# Patient Record
Sex: Female | Born: 1950
Health system: Southern US, Community
[De-identification: ages and names within clinical notes are randomized; demographics above are authoritative.]

## PROBLEM LIST (undated history)

## (undated) DIAGNOSIS — M1990 Unspecified osteoarthritis, unspecified site: Secondary | ICD-10-CM

## (undated) DIAGNOSIS — E785 Hyperlipidemia, unspecified: Secondary | ICD-10-CM

## (undated) DIAGNOSIS — I10 Essential (primary) hypertension: Secondary | ICD-10-CM

## (undated) DIAGNOSIS — I8390 Asymptomatic varicose veins of unspecified lower extremity: Secondary | ICD-10-CM

## (undated) DIAGNOSIS — L509 Urticaria, unspecified: Secondary | ICD-10-CM

## (undated) DIAGNOSIS — IMO0002 Reserved for concepts with insufficient information to code with codable children: Secondary | ICD-10-CM

## (undated) DIAGNOSIS — Z9289 Personal history of other medical treatment: Secondary | ICD-10-CM

## (undated) DIAGNOSIS — H9319 Tinnitus, unspecified ear: Secondary | ICD-10-CM

## (undated) DIAGNOSIS — R0789 Other chest pain: Secondary | ICD-10-CM

## (undated) DIAGNOSIS — K219 Gastro-esophageal reflux disease without esophagitis: Secondary | ICD-10-CM

## (undated) DIAGNOSIS — D259 Leiomyoma of uterus, unspecified: Secondary | ICD-10-CM

## (undated) DIAGNOSIS — K649 Unspecified hemorrhoids: Secondary | ICD-10-CM

## (undated) DIAGNOSIS — F419 Anxiety disorder, unspecified: Secondary | ICD-10-CM

## (undated) HISTORY — PX: KNEE SURGERY: SHX244

## (undated) HISTORY — DX: Gastro-esophageal reflux disease without esophagitis: K21.9

## (undated) HISTORY — PX: CARPAL TUNNEL RELEASE: SHX101

## (undated) HISTORY — PX: HEMORROIDECTOMY: SUR656

## (undated) HISTORY — DX: Essential (primary) hypertension: I10

## (undated) HISTORY — PX: TUBAL LIGATION: SHX77

## (undated) HISTORY — DX: Urticaria, unspecified: L50.9

## (undated) HISTORY — PX: JOINT REPLACEMENT: SHX530

## (undated) HISTORY — PX: ROTATOR CUFF REPAIR: SHX139

---

## 1973-07-04 HISTORY — PX: HERNIA REPAIR: SHX51

## 1976-07-04 DIAGNOSIS — Z9289 Personal history of other medical treatment: Secondary | ICD-10-CM

## 1976-07-04 HISTORY — DX: Personal history of other medical treatment: Z92.89

## 2000-12-06 ENCOUNTER — Encounter (HOSPITAL_COMMUNITY): Admission: RE | Admit: 2000-12-06 | Discharge: 2001-01-05 | Payer: Self-pay | Admitting: Orthopedic Surgery

## 2001-01-08 ENCOUNTER — Encounter (HOSPITAL_COMMUNITY): Admission: RE | Admit: 2001-01-08 | Discharge: 2001-01-31 | Payer: Self-pay | Admitting: Orthopedic Surgery

## 2001-01-23 ENCOUNTER — Other Ambulatory Visit: Admission: RE | Admit: 2001-01-23 | Discharge: 2001-01-23 | Payer: Self-pay | Admitting: Obstetrics and Gynecology

## 2001-02-01 ENCOUNTER — Ambulatory Visit (HOSPITAL_COMMUNITY): Admission: RE | Admit: 2001-02-01 | Discharge: 2001-02-01 | Payer: Self-pay | Admitting: Obstetrics and Gynecology

## 2001-02-01 ENCOUNTER — Encounter: Payer: Self-pay | Admitting: Obstetrics and Gynecology

## 2003-04-07 ENCOUNTER — Encounter: Payer: Self-pay | Admitting: Orthopedic Surgery

## 2004-01-26 ENCOUNTER — Other Ambulatory Visit: Admission: RE | Admit: 2004-01-26 | Discharge: 2004-01-26 | Payer: Self-pay | Admitting: Dermatology

## 2005-09-21 ENCOUNTER — Ambulatory Visit: Payer: Self-pay | Admitting: Orthopedic Surgery

## 2006-11-15 ENCOUNTER — Ambulatory Visit (HOSPITAL_COMMUNITY): Admission: RE | Admit: 2006-11-15 | Discharge: 2006-11-15 | Payer: Self-pay | Admitting: Family Medicine

## 2008-12-25 ENCOUNTER — Ambulatory Visit: Payer: Self-pay | Admitting: Orthopedic Surgery

## 2008-12-25 ENCOUNTER — Encounter (INDEPENDENT_AMBULATORY_CARE_PROVIDER_SITE_OTHER): Payer: Self-pay | Admitting: *Deleted

## 2008-12-25 DIAGNOSIS — Z8679 Personal history of other diseases of the circulatory system: Secondary | ICD-10-CM | POA: Insufficient documentation

## 2008-12-25 DIAGNOSIS — IMO0002 Reserved for concepts with insufficient information to code with codable children: Secondary | ICD-10-CM | POA: Insufficient documentation

## 2008-12-25 DIAGNOSIS — M25569 Pain in unspecified knee: Secondary | ICD-10-CM | POA: Insufficient documentation

## 2009-02-24 ENCOUNTER — Encounter: Admission: RE | Admit: 2009-02-24 | Discharge: 2009-02-24 | Payer: Self-pay | Admitting: Specialist

## 2009-04-24 ENCOUNTER — Ambulatory Visit (HOSPITAL_BASED_OUTPATIENT_CLINIC_OR_DEPARTMENT_OTHER): Admission: RE | Admit: 2009-04-24 | Discharge: 2009-04-24 | Payer: Self-pay | Admitting: Specialist

## 2010-02-18 ENCOUNTER — Ambulatory Visit (HOSPITAL_COMMUNITY): Admission: RE | Admit: 2010-02-18 | Discharge: 2010-02-18 | Payer: Self-pay | Admitting: Specialist

## 2010-02-18 ENCOUNTER — Ambulatory Visit: Payer: Self-pay | Admitting: Vascular Surgery

## 2010-03-05 ENCOUNTER — Ambulatory Visit (HOSPITAL_BASED_OUTPATIENT_CLINIC_OR_DEPARTMENT_OTHER): Admission: RE | Admit: 2010-03-05 | Discharge: 2010-03-05 | Payer: Self-pay | Admitting: Specialist

## 2010-09-16 LAB — POCT I-STAT 4, (NA,K, GLUC, HGB,HCT)
Glucose, Bld: 87 mg/dL (ref 70–99)
HCT: 36 % (ref 36.0–46.0)
Hemoglobin: 12.2 g/dL (ref 12.0–15.0)
Potassium: 3.4 mEq/L — ABNORMAL LOW (ref 3.5–5.1)
Sodium: 139 mEq/L (ref 135–145)

## 2010-10-07 LAB — POCT I-STAT 4, (NA,K, GLUC, HGB,HCT)
Glucose, Bld: 85 mg/dL (ref 70–99)
HCT: 39 % (ref 36.0–46.0)
Hemoglobin: 13.3 g/dL (ref 12.0–15.0)
Potassium: 3.4 mEq/L — ABNORMAL LOW (ref 3.5–5.1)
Sodium: 135 mEq/L (ref 135–145)

## 2011-12-01 ENCOUNTER — Encounter: Payer: Self-pay | Admitting: Orthopedic Surgery

## 2011-12-01 ENCOUNTER — Ambulatory Visit (INDEPENDENT_AMBULATORY_CARE_PROVIDER_SITE_OTHER): Payer: PRIVATE HEALTH INSURANCE

## 2011-12-01 ENCOUNTER — Ambulatory Visit (INDEPENDENT_AMBULATORY_CARE_PROVIDER_SITE_OTHER): Payer: PRIVATE HEALTH INSURANCE | Admitting: Orthopedic Surgery

## 2011-12-01 VITALS — BP 130/70 | Ht 65.0 in | Wt 215.0 lb

## 2011-12-01 DIAGNOSIS — M25569 Pain in unspecified knee: Secondary | ICD-10-CM

## 2011-12-01 DIAGNOSIS — M76899 Other specified enthesopathies of unspecified lower limb, excluding foot: Secondary | ICD-10-CM

## 2011-12-01 DIAGNOSIS — M7050 Other bursitis of knee, unspecified knee: Secondary | ICD-10-CM

## 2011-12-01 NOTE — Patient Instructions (Signed)
You have received a steroid shot. 15% of patients experience increased pain at the injection site with in the next 24 hours. This is best treated with ice and tylenol extra strength 2 tabs every 8 hours. If you are still having pain please call the office.   Apply several times a day   Max freeze over the counter at pharmacy 3 times a day

## 2011-12-02 DIAGNOSIS — M7050 Other bursitis of knee, unspecified knee: Secondary | ICD-10-CM | POA: Insufficient documentation

## 2011-12-02 NOTE — Progress Notes (Signed)
  Subjective:    Susan Oneill is a 61 y.o. female who presents with knee pain involving the right knee. Onset was sudden, not related to any specific activity. Inciting event: none known. Current symptoms include: pain located over the pes tendons  and swelling. Pain is aggravated by any weight bearing, going up and down stairs, rising after sitting, standing and walking. Patient has had prior knee problems. Evaluation to date: none. Treatment to date: none.  The following portions of the patient's history were reviewed and updated as appropriate: allergies, current medications, past family history, past medical history, past social history, past surgical history and problem list.   Review of Systems Pertinent items are noted in HPI.   Objective:  Vital signs are stable as recorded  General appearance is normal  The patient is alert and oriented x3  The patient's mood and affect are normal  Gait assessment: normal  The cardiovascular exam reveals normal pulses and temperature without edema swelling.  The lymphatic system is negative for palpable lymph nodes  The sensory exam is normal.  There are no pathologic reflexes.  Balance is normal.   Exam of the right knee  Inspection swelling medial side of the knee  Range of motion normal  Stability normal  Strength normal  Skin normal    Assessment:    Right Severe pes anserinus tendonitis on the right    Plan:    Natural history and expected course discussed. Questions answered. Rest, ice, compression, and elevation (RICE) therapy. OTC analgesics as needed.  Injection  Inject RIGHT knee bursa Knee  Injection Procedure Note  Pre-operative Diagnosis: right knee Bursitis Post-operative Diagnosis: same  Indications: pain  Anesthesia: ethyl chloride   Procedure Details   Verbal consent was obtained for the procedure. Time out was completed.The Medial pes  bursa was prepped with alcohol, followed by  Ethyl  chloride spray and A 25 gauge needle was inserted into the area of maximal tenderness.  1ml 1% lidocaine and 1 ml of depomedrol  was then injected into the bursa . The needle was removed and the area cleansed and dressed.  Complications:  None; patient tolerated the procedure well

## 2012-02-19 ENCOUNTER — Emergency Department (HOSPITAL_COMMUNITY)
Admission: EM | Admit: 2012-02-19 | Discharge: 2012-02-19 | Disposition: A | Discharge status: Home / Self Care | Payer: PRIVATE HEALTH INSURANCE | Attending: Emergency Medicine | Admitting: Emergency Medicine

## 2012-02-19 ENCOUNTER — Encounter (HOSPITAL_COMMUNITY): Payer: Self-pay | Admitting: *Deleted

## 2012-02-19 DIAGNOSIS — I739 Peripheral vascular disease, unspecified: Secondary | ICD-10-CM | POA: Insufficient documentation

## 2012-02-19 DIAGNOSIS — Z880 Allergy status to penicillin: Secondary | ICD-10-CM | POA: Insufficient documentation

## 2012-02-19 DIAGNOSIS — S81009A Unspecified open wound, unspecified knee, initial encounter: Secondary | ICD-10-CM | POA: Insufficient documentation

## 2012-02-19 DIAGNOSIS — S81819A Laceration without foreign body, unspecified lower leg, initial encounter: Secondary | ICD-10-CM

## 2012-02-19 DIAGNOSIS — I10 Essential (primary) hypertension: Secondary | ICD-10-CM | POA: Insufficient documentation

## 2012-02-19 DIAGNOSIS — Z7982 Long term (current) use of aspirin: Secondary | ICD-10-CM | POA: Insufficient documentation

## 2012-02-19 DIAGNOSIS — Z882 Allergy status to sulfonamides status: Secondary | ICD-10-CM | POA: Insufficient documentation

## 2012-02-19 DIAGNOSIS — K219 Gastro-esophageal reflux disease without esophagitis: Secondary | ICD-10-CM | POA: Insufficient documentation

## 2012-02-19 DIAGNOSIS — W278XXA Contact with other nonpowered hand tool, initial encounter: Secondary | ICD-10-CM | POA: Insufficient documentation

## 2012-02-19 MED ORDER — "THROMBI-PAD 3""X3"" EX PADS"
MEDICATED_PAD | CUTANEOUS | Status: AC
  Administered 2012-02-19: 18:00:00
  Filled 2012-02-19: qty 1

## 2012-02-19 MED ORDER — LIDOCAINE HCL (PF) 1 % IJ SOLN
INTRAMUSCULAR | Status: AC
  Filled 2012-02-19: qty 5

## 2012-02-19 NOTE — ED Notes (Signed)
Applied Thrombin pad to L leg site as it is still oozing.

## 2012-02-19 NOTE — ED Notes (Signed)
Patient with no complaints at this time. Respirations even and unlabored. Skin warm/dry. Discharge instructions reviewed with patient at this time. Patient given opportunity to voice concerns/ask questions. Patient discharged at this time and left Emergency Department with steady gait.   

## 2012-02-19 NOTE — ED Provider Notes (Signed)
History    This chart was scribed for Susan Melter, MD, MD by Susan Oneill. The patient was seen in room APA14 and the patient's care was started at 3:20PM.   CSN: [id number]  Arrival date & time 02/19/12  1445   First MD Initiated Contact with Patient 02/19/12 1513      Chief Complaint  Patient presents with  . Leg Injury    (Consider location/radiation/quality/duration/timing/severity/associated sxs/prior treatment) The history is provided by the patient.   Susan Oneill is a 61 y.o. female who presents to the Emergency Department complaining of lower left leg laceration due to shaving today. She reports that she cut a varicose vein. She takes asa. Pt reports that she has hx of HTN, acid reflux and knee surgery.    Past Medical History  Diagnosis Date  . HTN (hypertension)   . Acid reflux   . PVD (peripheral vascular disease)     Past Surgical History  Procedure Date  . Knee surgery     bilateral  . Carpal tunnel release     bilateral  . Hernia repair   . Hemorroidectomy     Family History  Problem Relation Age of Onset  . Arthritis    . Heart disease      History  Substance Use Topics  . Smoking status: Never Smoker   . Smokeless tobacco: Not on file  . Alcohol Use: No    OB History    Grav Para Term Preterm Abortions TAB SAB Ect Mult Living   2 2 2              Review of Systems  All other systems reviewed and are negative.   10 Systems reviewed and all are negative for acute change except as noted in the HPI.   Allergies  Amoxicillin; Codeine; and Sulfonamide derivatives  Home Medications   Current Outpatient Rx  Name Route Sig Dispense Refill  . ASPIRIN EC 81 MG PO TBEC Oral Take 81 mg by mouth daily.    Marland Kitchen HYDROCHLOROTHIAZIDE 25 MG PO TABS Oral Take 25 mg by mouth daily.    . IBUPROFEN 200 MG PO TABS Oral Take 800 mg by mouth every 6 (six) hours as needed. pain    . LISINOPRIL 20 MG PO TABS Oral Take 20 mg by mouth daily.    Marland Kitchen  RANITIDINE HCL 300 MG PO TABS Oral Take 300 mg by mouth daily.      BP 146/66  Pulse 85  Temp 98.2 F (36.8 C) (Oral)  Resp 18  Ht 5\' 5"  (1.651 m)  Wt 214 lb (97.07 kg)  BMI 35.61 kg/m2  SpO2 100%  Physical Exam  Nursing note and vitals reviewed. Constitutional: She is oriented to person, place, and time. She appears well-developed and well-nourished. No distress.  HENT:  Head: Normocephalic and atraumatic.  Eyes: Conjunctivae are normal.  Neck: Normal range of motion. Neck supple.  Pulmonary/Chest: Effort normal. No respiratory distress.  Musculoskeletal: Normal range of motion.       Tiny puncture mid left calf that is bleeding and is not pulsatile   Neurological: She is alert and oriented to person, place, and time.  Skin: Skin is warm and dry.  Psychiatric: She has a normal mood and affect. Her behavior is normal.    ED Course  Procedures (including critical care time) DIAGNOSTIC STUDIES: Oxygen Saturation is 100% on room air, normal by my interpretation.    COORDINATION OF CARE: 3:23PM Discussed pt  ED treatment with pt  LACERATION REPAIR PROCEDURE NOTE The patient's identification was confirmed and consent was obtained. This procedure was performed by Susan Melter, MD at 3:36 PM. Site: mid left calf  Sterile procedures observed betadine  Anesthetic used (type and amt): 1% lidocaine without epi Suture type/size:4-O nylon  Length: punctate  # of Sutures: 1 Technique:single purse stitch  Complexity: simple Antibx ointment applied yes Tetanus UTD Site anesthetized, irrigated with NS, explored without evidence of foreign body, wound well approximated, site covered with dry, sterile dressing.  Patient tolerated procedure well without complications. Instructions for care discussed verbally and patient provided with additional written instructions for homecare and f/u. Pressure bandage applied and pt monitored Bleeding controlled after Thrombin  application      1. Laceration of leg       MDM  It is, bleeding, secondary to razor laceration of skin, and underlying varicosity. Patient is improved, with treatment in the ED.   I personally performed the services described in this documentation, which was scribed in my presence. The recorded information has been reviewed and considered.    Plan: Home Medications- usual; Home Treatments- wound care; Recommended follow up- Suture removal in 5 days      Susan Melter, MD 02/19/12 2149

## 2012-02-19 NOTE — ED Notes (Signed)
Lacerated varicose vein on L lower lateral leg w/razor.

## 2012-02-19 NOTE — ED Notes (Signed)
No oozing noted under Thrombi pad.  Wrapped leg w/ace wrap per Dr. Effie Shy.

## 2012-02-20 ENCOUNTER — Emergency Department (HOSPITAL_COMMUNITY)
Admission: EM | Admit: 2012-02-20 | Discharge: 2012-02-20 | Disposition: A | Discharge status: Home / Self Care | Payer: PRIVATE HEALTH INSURANCE | Attending: Emergency Medicine | Admitting: Emergency Medicine

## 2012-02-20 ENCOUNTER — Encounter (HOSPITAL_COMMUNITY): Payer: Self-pay | Admitting: *Deleted

## 2012-02-20 DIAGNOSIS — K219 Gastro-esophageal reflux disease without esophagitis: Secondary | ICD-10-CM | POA: Insufficient documentation

## 2012-02-20 DIAGNOSIS — I10 Essential (primary) hypertension: Secondary | ICD-10-CM | POA: Insufficient documentation

## 2012-02-20 DIAGNOSIS — I739 Peripheral vascular disease, unspecified: Secondary | ICD-10-CM | POA: Insufficient documentation

## 2012-02-20 DIAGNOSIS — I83899 Varicose veins of unspecified lower extremities with other complications: Secondary | ICD-10-CM

## 2012-02-20 DIAGNOSIS — Z4801 Encounter for change or removal of surgical wound dressing: Secondary | ICD-10-CM | POA: Insufficient documentation

## 2012-02-20 NOTE — ED Notes (Signed)
Pt with bleeding earlier to suture, no bleeding at this time

## 2012-02-20 NOTE — ED Provider Notes (Signed)
History     CSN: [id number]  Arrival date & time 02/20/12  2035   First MD Initiated Contact with Patient 02/20/12 2104      Chief Complaint  Patient presents with  . Leg Injury    (Consider location/radiation/quality/duration/timing/severity/associated sxs/prior treatment) HPI Comments: Patient returns to ED c/o episode of bleeding from a previously sutured varicose vein.  States the bleeding occurred when the previous dressing was removed.  She denies swelling, discoloration , numbness or weakness of the leg.    Patient is a 61 y.o. female presenting with wound check. The history is provided by the patient.  Wound Check  She was treated in the ED yesterday. Prior ED Treatment: suture of a bleeding varicose vein. There has been no treatment since the wound repair. Maximum temperature: no fever. There has been no drainage from the wound. There is no redness present. There is no swelling present. The pain has improved. She has no difficulty moving the affected extremity or digit.    Past Medical History  Diagnosis Date  . HTN (hypertension)   . Acid reflux   . PVD (peripheral vascular disease)     Past Surgical History  Procedure Date  . Knee surgery     bilateral  . Carpal tunnel release     bilateral  . Hernia repair   . Hemorroidectomy     Family History  Problem Relation Age of Onset  . Arthritis    . Heart disease      History  Substance Use Topics  . Smoking status: Never Smoker   . Smokeless tobacco: Not on file  . Alcohol Use: No    OB History    Grav Para Term Preterm Abortions TAB SAB Ect Mult Living   2 2 2              Review of Systems  Constitutional: Negative for fever and chills.  Genitourinary: Negative for dysuria and difficulty urinating.  Musculoskeletal: Positive for joint swelling and arthralgias.  Skin: Positive for wound. Negative for color change.  All other systems reviewed and are negative.    Allergies  Amoxicillin;  Codeine; and Sulfonamide derivatives  Home Medications   Current Outpatient Rx  Name Route Sig Dispense Refill  . ASPIRIN EC 81 MG PO TBEC Oral Take 81 mg by mouth daily.    Marland Kitchen HYDROCHLOROTHIAZIDE 25 MG PO TABS Oral Take 25 mg by mouth daily.    . IBUPROFEN 200 MG PO TABS Oral Take 800 mg by mouth every 6 (six) hours as needed. pain    . LISINOPRIL 20 MG PO TABS Oral Take 20 mg by mouth daily.    Marland Kitchen RANITIDINE HCL 300 MG PO TABS Oral Take 300 mg by mouth daily.      BP 153/72  Pulse 108  Temp 98.5 F (36.9 C) (Oral)  Resp 20  Ht 5\' 5"  (1.651 m)  Wt 214 lb (97.07 kg)  BMI 35.61 kg/m2  SpO2 100%  Physical Exam  Nursing note and vitals reviewed. Constitutional: She is oriented to person, place, and time. She appears well-developed and well-nourished. No distress.  HENT:  Head: Normocephalic and atraumatic.  Musculoskeletal: Normal range of motion. She exhibits no edema.       Left lower leg: She exhibits no tenderness, no bony tenderness, no swelling, no edema, no deformity and no laceration.       Legs: Neurological: She is alert and oriented to person, place, and time. She exhibits normal muscle  tone. Coordination normal.  Skin:       See MS exam    ED Course  Procedures (including critical care time)  Labs Reviewed - No data to display No results found.      MDM    Patient has a single suture to the left lower leg. No active bleeding. No surrounding erythema or edema. Previous dressing had dried blood present when removed.   I cleaned the wound and covered with a quick clot dressing. Patient agrees to remove dressing in 24 hours and she also has a followup appointment with her primary care physician in 5 days for suture removal.  Dorsalis pedis pulse is brisk, sensation is intact, cap refill is less than 2 seconds  The patient appears reasonably screened and/or stabilized for discharge and I doubt any other medical condition or other [facility] requiring further  screening, evaluation, or treatment in the ED at this time prior to discharge.    Saleema Weppler L. [place], [place] 02/24/12 1923

## 2012-02-20 NOTE — ED Notes (Signed)
Pt had bleeding from varicose vein to lt leg , seen here last night and sutured.  Tonight when removed dsg, it began bleeding again.  No bleeding at present

## 2012-02-27 NOTE — ED Provider Notes (Signed)
Medical screening examination/treatment/procedure(s) were performed by non-physician practitioner and as supervising physician I was immediately available for consultation/collaboration.  Danee Soller, MD 02/27/12 1822 

## 2012-11-01 DIAGNOSIS — R0789 Other chest pain: Secondary | ICD-10-CM

## 2012-11-01 HISTORY — DX: Other chest pain: R07.89

## 2012-11-16 ENCOUNTER — Other Ambulatory Visit: Payer: Self-pay

## 2012-11-16 ENCOUNTER — Inpatient Hospital Stay (HOSPITAL_COMMUNITY)
Admission: EM | Admit: 2012-11-16 | Discharge: 2012-11-17 | DRG: 313 | Disposition: A | Discharge status: Home / Self Care | Payer: PRIVATE HEALTH INSURANCE | Attending: Internal Medicine | Admitting: Internal Medicine

## 2012-11-16 ENCOUNTER — Emergency Department (HOSPITAL_COMMUNITY): Payer: PRIVATE HEALTH INSURANCE

## 2012-11-16 ENCOUNTER — Encounter (HOSPITAL_COMMUNITY): Payer: Self-pay

## 2012-11-16 DIAGNOSIS — Z8679 Personal history of other diseases of the circulatory system: Secondary | ICD-10-CM

## 2012-11-16 DIAGNOSIS — Z79899 Other long term (current) drug therapy: Secondary | ICD-10-CM

## 2012-11-16 DIAGNOSIS — I8390 Asymptomatic varicose veins of unspecified lower extremity: Secondary | ICD-10-CM | POA: Diagnosis present

## 2012-11-16 DIAGNOSIS — K219 Gastro-esophageal reflux disease without esophagitis: Secondary | ICD-10-CM | POA: Diagnosis present

## 2012-11-16 DIAGNOSIS — R079 Chest pain, unspecified: Secondary | ICD-10-CM | POA: Diagnosis present

## 2012-11-16 DIAGNOSIS — Z6835 Body mass index (BMI) 35.0-35.9, adult: Secondary | ICD-10-CM

## 2012-11-16 DIAGNOSIS — R0789 Other chest pain: Principal | ICD-10-CM | POA: Diagnosis present

## 2012-11-16 DIAGNOSIS — I10 Essential (primary) hypertension: Secondary | ICD-10-CM | POA: Diagnosis present

## 2012-11-16 DIAGNOSIS — Z8249 Family history of ischemic heart disease and other diseases of the circulatory system: Secondary | ICD-10-CM

## 2012-11-16 DIAGNOSIS — E669 Obesity, unspecified: Secondary | ICD-10-CM | POA: Diagnosis present

## 2012-11-16 DIAGNOSIS — I739 Peripheral vascular disease, unspecified: Secondary | ICD-10-CM | POA: Diagnosis present

## 2012-11-16 LAB — BASIC METABOLIC PANEL
BUN: 20 mg/dL (ref 6–23)
CO2: 26 mEq/L (ref 19–32)
Chloride: 102 mEq/L (ref 96–112)
Glucose, Bld: 106 mg/dL — ABNORMAL HIGH (ref 70–99)
Potassium: 3.9 mEq/L (ref 3.5–5.1)
Sodium: 136 mEq/L (ref 135–145)

## 2012-11-16 LAB — TROPONIN I
Troponin I: <0.3 ng/mL (ref <0.30)
Troponin I: <0.3 ng/mL (ref <0.30)

## 2012-11-16 LAB — CBC WITH DIFFERENTIAL/PLATELET
Eosinophils Absolute: 0.1 10*3/uL (ref 0.0–0.7)
Hemoglobin: 11.6 g/dL — ABNORMAL LOW (ref 12.0–15.0)
Lymphocytes Relative: 31 % (ref 12–46)
Lymphs Abs: 1.4 10*3/uL (ref 0.7–4.0)
MCH: 28.4 pg (ref 26.0–34.0)
Monocytes Relative: 6 % (ref 3–12)
Neutro Abs: 2.8 10*3/uL (ref 1.7–7.7)
Neutrophils Relative %: 61 % (ref 43–77)
Platelets: 230 10*3/uL (ref 150–400)
RBC: 4.09 MIL/uL (ref 3.87–5.11)
WBC: 4.7 10*3/uL (ref 4.0–10.5)

## 2012-11-16 LAB — D-DIMER, QUANTITATIVE: D-Dimer, Quant: 0.94 ug/mL-FEU — ABNORMAL HIGH (ref 0.00–0.48)

## 2012-11-16 LAB — MRSA PCR SCREENING: MRSA by PCR: NEGATIVE

## 2012-11-16 MED ORDER — HYDROCODONE-ACETAMINOPHEN 5-325 MG PO TABS
1.0000 | ORAL_TABLET | ORAL | Status: DC | PRN
  Administered 2012-11-17: 1 via ORAL
  Filled 2012-11-16: qty 1

## 2012-11-16 MED ORDER — ASPIRIN EC 325 MG PO TBEC
325.0000 mg | DELAYED_RELEASE_TABLET | Freq: Every day | ORAL | Status: DC
  Administered 2012-11-17: 325 mg via ORAL
  Filled 2012-11-16 (×2): qty 1

## 2012-11-16 MED ORDER — KETOROLAC TROMETHAMINE 30 MG/ML IJ SOLN
30.0000 mg | Freq: Once | INTRAMUSCULAR | Status: AC
  Administered 2012-11-16: 30 mg via INTRAVENOUS
  Filled 2012-11-16: qty 1

## 2012-11-16 MED ORDER — ASPIRIN 81 MG PO CHEW
324.0000 mg | CHEWABLE_TABLET | Freq: Once | ORAL | Status: AC
  Administered 2012-11-16: 324 mg via ORAL
  Filled 2012-11-16: qty 4

## 2012-11-16 MED ORDER — NITROGLYCERIN IN D5W 200-5 MCG/ML-% IV SOLN: 2.0000 ug/min | INTRAVENOUS | Status: DC

## 2012-11-16 MED ORDER — LORAZEPAM 1 MG PO TABS
1.0000 mg | ORAL_TABLET | Freq: Once | ORAL | Status: AC
  Administered 2012-11-16: 1 mg via ORAL
  Filled 2012-11-16: qty 1

## 2012-11-16 MED ORDER — MORPHINE SULFATE 2 MG/ML IJ SOLN
2.0000 mg | INTRAMUSCULAR | Status: DC | PRN
  Filled 2012-11-16: qty 1

## 2012-11-16 MED ORDER — NITROGLYCERIN 2 % TD OINT
1.0000 [in_us] | TOPICAL_OINTMENT | Freq: Four times a day (QID) | TRANSDERMAL | Status: DC
  Administered 2012-11-16 – 2012-11-17 (×3): 1 [in_us] via TOPICAL
  Filled 2012-11-16: qty 30
  Filled 2012-11-16: qty 1

## 2012-11-16 MED ORDER — GI COCKTAIL ~~LOC~~
30.0000 mL | Freq: Two times a day (BID) | ORAL | Status: DC
  Administered 2012-11-16 – 2012-11-17 (×2): 30 mL via ORAL
  Filled 2012-11-16 (×3): qty 30

## 2012-11-16 MED ORDER — ONDANSETRON HCL 4 MG/2ML IJ SOLN: 4.0000 mg | Freq: Four times a day (QID) | INTRAMUSCULAR | Status: DC | PRN

## 2012-11-16 MED ORDER — GUAIFENESIN-DM 100-10 MG/5ML PO SYRP: 5.0000 mL | ORAL_SOLUTION | ORAL | Status: DC | PRN

## 2012-11-16 MED ORDER — ALBUTEROL SULFATE (5 MG/ML) 0.5% IN NEBU: 2.5000 mg | INHALATION_SOLUTION | RESPIRATORY_TRACT | Status: DC | PRN

## 2012-11-16 MED ORDER — NITROGLYCERIN IN D5W 200-5 MCG/ML-% IV SOLN
INTRAVENOUS | Status: AC
  Administered 2012-11-16: 50000 ug
  Filled 2012-11-16: qty 250

## 2012-11-16 MED ORDER — SODIUM CHLORIDE 0.9 % IJ SOLN
3.0000 mL | Freq: Two times a day (BID) | INTRAMUSCULAR | Status: DC
  Administered 2012-11-16 – 2012-11-17 (×2): 3 mL via INTRAVENOUS

## 2012-11-16 MED ORDER — IOHEXOL 350 MG/ML SOLN
100.0000 mL | Freq: Once | INTRAVENOUS | Status: AC | PRN
  Administered 2012-11-16: 100 mL via INTRAVENOUS

## 2012-11-16 MED ORDER — FAMOTIDINE 40 MG PO TABS
40.0000 mg | ORAL_TABLET | Freq: Two times a day (BID) | ORAL | Status: DC
  Administered 2012-11-16: 40 mg via ORAL
  Filled 2012-11-16 (×3): qty 1

## 2012-11-16 MED ORDER — NITROGLYCERIN 0.4 MG SL SUBL
0.4000 mg | SUBLINGUAL_TABLET | Freq: Once | SUBLINGUAL | Status: AC
  Administered 2012-11-16: 0.4 mg via SUBLINGUAL
  Filled 2012-11-16: qty 25

## 2012-11-16 MED ORDER — POLYETHYLENE GLYCOL 3350 17 G PO PACK
17.0000 g | PACK | Freq: Every day | ORAL | Status: DC | PRN
  Filled 2012-11-16: qty 1

## 2012-11-16 MED ORDER — HYDROCHLOROTHIAZIDE 25 MG PO TABS
25.0000 mg | ORAL_TABLET | Freq: Every day | ORAL | Status: DC
  Administered 2012-11-17: 25 mg via ORAL
  Filled 2012-11-16 (×3): qty 1

## 2012-11-16 MED ORDER — ONDANSETRON HCL 4 MG PO TABS: 4.0000 mg | ORAL_TABLET | Freq: Four times a day (QID) | ORAL | Status: DC | PRN

## 2012-11-16 MED ORDER — MORPHINE SULFATE 4 MG/ML IJ SOLN
4.0000 mg | Freq: Once | INTRAMUSCULAR | Status: AC
  Administered 2012-11-16: 4 mg via INTRAVENOUS
  Filled 2012-11-16: qty 1

## 2012-11-16 MED ORDER — LISINOPRIL 20 MG PO TABS
20.0000 mg | ORAL_TABLET | Freq: Every day | ORAL | Status: DC
  Administered 2012-11-16 – 2012-11-17 (×2): 20 mg via ORAL
  Filled 2012-11-16 (×2): qty 1

## 2012-11-16 MED ORDER — SODIUM CHLORIDE 0.9 % IV SOLN
INTRAVENOUS | Status: DC
  Administered 2012-11-16: 22:00:00 via INTRAVENOUS

## 2012-11-16 MED ORDER — HEPARIN SODIUM (PORCINE) 5000 UNIT/ML IJ SOLN
5000.0000 [IU] | Freq: Three times a day (TID) | INTRAMUSCULAR | Status: DC
  Administered 2012-11-16 – 2012-11-17 (×3): 5000 [IU] via SUBCUTANEOUS
  Filled 2012-11-16 (×5): qty 1

## 2012-11-16 MED ORDER — KETOROLAC TROMETHAMINE 30 MG/ML IJ SOLN
15.0000 mg | Freq: Once | INTRAMUSCULAR | Status: AC
  Administered 2012-11-16: 15 mg via INTRAVENOUS
  Filled 2012-11-16: qty 1

## 2012-11-16 MED ORDER — KETOROLAC TROMETHAMINE 15 MG/ML IJ SOLN: 15.0000 mg | Freq: Once | INTRAMUSCULAR | Status: DC

## 2012-11-16 NOTE — ED Notes (Signed)
Second NTG SL 0.4mg  given

## 2012-11-16 NOTE — ED Notes (Signed)
Complain of left sided chest pain that started at 0200 this morning. Pt states she cannot take ASA due to varicose veins. States she did take two pain pills that she was using for knee pain

## 2012-11-16 NOTE — ED Provider Notes (Signed)
History     CSN: [id number]  Arrival date & time 11/16/12  1140   First MD Initiated Contact with Patient 11/16/12 1214      Chief Complaint  Patient presents with  . Chest Pain    (Consider location/radiation/quality/duration/timing/severity/associated sxs/prior treatment) Patient is a 62 y.o. female presenting with chest pain. The history is provided by the patient.  Chest Pain She woke up this morning at 2 AM with left-sided chest pain which radiates around to the back. Pain is moderately severe and she rates it at 9/10. Are described as a tight feeling. Nothing makes it better nothing makes it worse. Specifically, it is not worse with movement or deep breathing or exertion or laying supine. There is no associated dyspnea, nausea, diaphoresis. She's not had any pain like this before. She took a dose of hydrocodone-acetaminophen with no relief of pain. Cardiac risk factors include hypertension and positive family history of coronary artery disease with her father having had heart disease starting in his mid 20s.  Past Medical History  Diagnosis Date  . HTN (hypertension)   . Acid reflux   . PVD (peripheral vascular disease)     Past Surgical History  Procedure Laterality Date  . Knee surgery      bilateral  . Carpal tunnel release      bilateral  . Hernia repair    . Hemorroidectomy      Family History  Problem Relation Age of Onset  . Arthritis    . Heart disease      History  Substance Use Topics  . Smoking status: Never Smoker   . Smokeless tobacco: Not on file  . Alcohol Use: No    OB History   Grav Para Term Preterm Abortions TAB SAB Ect Mult Living   2 2 2              Review of Systems  Cardiovascular: Positive for chest pain.  All other systems reviewed and are negative.    Allergies  Amoxicillin; Codeine; and Sulfonamide derivatives  Home Medications   Current Outpatient Rx  Name  Route  Sig  Dispense  Refill  . hydrochlorothiazide  (HYDRODIURIL) 25 MG tablet   Oral   Take 25 mg by mouth daily.         Marland Kitchen HYDROcodone-acetaminophen (NORCO/VICODIN) 5-325 MG per tablet   Oral   Take 1 tablet by mouth every 6 (six) hours as needed for pain.         Marland Kitchen lisinopril (PRINIVIL,ZESTRIL) 20 MG tablet   Oral   Take 20 mg by mouth daily.         . naproxen (NAPROSYN) 500 MG tablet   Oral   Take 500 mg by mouth 2 (two) times daily with a meal.         . ranitidine (ZANTAC) 300 MG tablet   Oral   Take 300 mg by mouth daily.         . traMADol (ULTRAM) 50 MG tablet   Oral   Take 50 mg by mouth every 6 (six) hours as needed for pain.           BP 131/76  Pulse 92  Temp(Src) 98.1 F (36.7 C) (Oral)  Resp 20  Ht 5\' 5"  (1.651 m)  Wt 220 lb (99.791 kg)  BMI 36.61 kg/m2  SpO2 99%  Physical Exam  Nursing note and vitals reviewed.  62 year old female, resting comfortably and in no acute distress. Vital  signs are normal. Oxygen saturation is 99%, which is normal. Head is normocephalic and atraumatic. PERRLA, EOMI. Oropharynx is clear. Neck is nontender and supple without adenopathy or JVD. Back is nontender and there is no CVA tenderness. Lungs are clear without rales, wheezes, or rhonchi. Chest is moderately tender in the left parasternal area and the left inframammary area. Heart has regular rate and rhythm without murmur. Abdomen is soft, flat, nontender without masses or hepatosplenomegaly and peristalsis is normoactive. Extremities have no cyanosis or edema, full range of motion is present. Skin is warm and dry without rash. Neurologic: Mental status is normal, cranial nerves are intact, there are no motor or sensory deficits.  ED Course  Procedures (including critical care time)  Results for orders placed during the hospital encounter of 11/16/12  CBC WITH DIFFERENTIAL      Result Value Range   WBC 4.7  4.0 - 10.5 K/uL   RBC 4.09  3.87 - 5.11 MIL/uL   Hemoglobin 11.6 (*) 12.0 - 15.0 g/dL   HCT  64.4 (*) 03.4 - 46.0 %   MCV 86.8  78.0 - 100.0 fL   MCH 28.4  26.0 - 34.0 pg   MCHC 32.7  30.0 - 36.0 g/dL   RDW 74.2  59.5 - 63.8 %   Platelets 230  150 - 400 K/uL   Neutrophils Relative % 61  43 - 77 %   Neutro Abs 2.8  1.7 - 7.7 K/uL   Lymphocytes Relative 31  12 - 46 %   Lymphs Abs 1.4  0.7 - 4.0 K/uL   Monocytes Relative 6  3 - 12 %   Monocytes Absolute 0.3  0.1 - 1.0 K/uL   Eosinophils Relative 2  0 - 5 %   Eosinophils Absolute 0.1  0.0 - 0.7 K/uL   Basophils Relative 1  0 - 1 %   Basophils Absolute 0.0  0.0 - 0.1 K/uL  BASIC METABOLIC PANEL      Result Value Range   Sodium 136  135 - 145 mEq/L   Potassium 3.9  3.5 - 5.1 mEq/L   Chloride 102  96 - 112 mEq/L   CO2 26  19 - 32 mEq/L   Glucose, Bld 106 (*) 70 - 99 mg/dL   BUN 20  6 - 23 mg/dL   Creatinine, Ser 7.56  0.50 - 1.10 mg/dL   Calcium 8.9  8.4 - 43.3 mg/dL   GFR calc non Af Amer >90  >90 mL/min   GFR calc Af Amer >90  >90 mL/min  TROPONIN I      Result Value Range   Troponin I <0.30  <0.30 ng/mL  D-DIMER, QUANTITATIVE      Result Value Range   D-Dimer, Quant 0.94 (*) 0.00 - 0.48 ug/mL-FEU   Ct Angio Chest W/cm &/or Wo Cm  11/16/2012   *RADIOLOGY REPORT*  Clinical Data: Chest pain  CT ANGIOGRAPHY CHEST  Technique:  Multidetector CT imaging of the chest using the standard protocol during bolus administration of intravenous contrast. Multiplanar reconstructed images including MIPs were obtained and reviewed to evaluate the vascular anatomy.  Contrast: OMNIPAQUE IOHEXOL 350 MG/ML SOLN  Comparison: Chest x-ray today  Findings: Negative for pulmonary embolism.  Aorta is normal in caliber and  there is no aortic dissection.  Heart size is normal and there is no pericardial effusion.  No coronary calcification is identified.  Lungs are clear without infiltrate or effusion.  No mass or adenopathy.  Mild degenerative  change in the thoracic spine.  IMPRESSION: Negative for pulmonary embolism.  No acute abnormality.    Original Report Authenticated By: Janeece Riggers, M.D.   Dg Chest Portable 1 View  11/16/2012   *RADIOLOGY REPORT*  Clinical Data: Chest pain.  High blood pressure.  Nonsmoker.  PORTABLE CHEST - 1 VIEW  Comparison: None.  Findings: Elevated right hemidiaphragm with limited evaluation of the right lung base. The patient would eventually benefit from follow-up two-view chest with cardiac leads removed.  No infiltrate, congestive heart failure or pneumothorax.  Heart size within normal limits.  IMPRESSION: No acute abnormality.  Please see above.   Original Report Authenticated By: Lacy Duverney, M.D.    ECG shows normal sinus rhythm with a rate of 86, no ectopy. Normal axis. Normal P wave. Normal QRS. Normal intervals. Normal ST and T waves. Impression: normal ECG. Compared with ECG of 04/24/2009, no significant changes are seen   1. Chest pain       MDM  Chest pain of uncertain cause. Chest wall tenderness certainly suggests chest wall pain. Also, of note, she had a recent train trip from [place]. This is a relatively short trip and not something that she put her at increased risk for pulmonary embolism. She does have 2 cardiac risk factors. She'll be given aspirin and a trial of nitroglycerin.  She got no relief with above noted treatment. Workup has been negative thus far. She'll be given a trial of ketorolac. When she is pain-free, she will be admitted for observation.      Dione Booze, MD 11/16/12 [phone]

## 2012-11-16 NOTE — ED Notes (Signed)
Left sided chest pain since 0200 this morning, denies any SOB, N/V

## 2012-11-16 NOTE — ED Notes (Signed)
Third NTG SL 0.4mg  given

## 2012-11-16 NOTE — ED Provider Notes (Signed)
Care assumed at the change of shift. Per Dr. Reynolds Bowl plan, will admit for rule out MI. Second Trop pending. Discussed with Hospitalist.   Bonnita Levan. Bernette Mayers, MD 11/16/12 1714

## 2012-11-16 NOTE — ED Notes (Addendum)
Per JC, RN with [facility], NTG gtt started per their standard protocol due to pt with continued CP; NTG paste removed from pt's chest

## 2012-11-16 NOTE — H&P (Addendum)
[facility]                                                                                    Patient Demographics  Susan Oneill, is a 62 y.o. female  CSN: [id number]  MRN: [id number]  DOB - 1951/03/11  Admit Date - 11/16/2012  Outpatient Primary MD for the patient is Lilyan Punt, MD   With History of -  Past Medical History  Diagnosis Date  . HTN (hypertension)   . Acid reflux   . PVD (peripheral vascular disease)       Past Surgical History  Procedure Laterality Date  . Knee surgery      bilateral  . Carpal tunnel release      bilateral  . Hernia repair    . Hemorroidectomy      in for   Chief Complaint  Patient presents with  . Chest Pain     HPI  Susan Oneill  is a 62 y.o. female, with history of hypertension, peripheral vascular disease, presents to the hospital with 12-14 hour history of left-sided pressure-like sensation radiating to her scapula, this started early this morning while she was sleeping, sensation is constant, no aggravating or relieving factors, no associated factors, she presented to the ER as the pain/pressure was not getting better in the ER her d-dimer was slightly elevated, a CT angiogram was obtained which was unremarkable, her initial EKG and troponin were unremarkable, her symptoms improved after she received nitroglycerin and I was called to admit the patient.   Patient herself denies any fever chills, no cough, no abdominal pain, no dysuria, no nausea vomiting, she does have some reflux from time to time and takes medications for the same. She currently feels better, she's not short of breath, no exertional component to the chest discomfort.    Review of Systems    In addition to the HPI above  No Fever-chills, No Headache, No changes with Vision or hearing, No problems swallowing food or Liquids, No Cough or Shortness of Breath, No Abdominal pain, No Nausea or Vommitting, Bowel movements are regular, No Blood  in stool or Urine, No dysuria, No new skin rashes or bruises, No new joints pains-aches,  No new weakness, tingling, numbness in any extremity, No recent weight gain or loss, No polyuria, polydypsia or polyphagia, No significant Mental Stressors.  A full 10 point Review of Systems was done, except as stated above, all other Review of Systems were negative.   Social History History  Substance Use Topics  . Smoking status: Never Smoker   . Smokeless tobacco: Not on file  . Alcohol Use: No      Family History Family History  Problem Relation Age of Onset  . Arthritis    . Heart disease        Prior to Admission medications   Medication Sig Start Date End Date Taking? Authorizing Provider  hydrochlorothiazide (HYDRODIURIL) 25 MG tablet Take 25 mg by mouth daily.   Yes Historical Provider, MD  HYDROcodone-acetaminophen (NORCO/VICODIN) 5-325 MG per tablet Take 1 tablet by mouth every 6 (six) hours as needed for pain.   Yes Historical Provider, MD  lisinopril (  PRINIVIL,ZESTRIL) 20 MG tablet Take 20 mg by mouth daily.   Yes Historical Provider, MD  naproxen (NAPROSYN) 500 MG tablet Take 500 mg by mouth 2 (two) times daily with a meal.   Yes Historical Provider, MD  ranitidine (ZANTAC) 300 MG tablet Take 300 mg by mouth daily.   Yes Historical Provider, MD  traMADol (ULTRAM) 50 MG tablet Take 50 mg by mouth every 6 (six) hours as needed for pain.   Yes Historical Provider, MD    Allergies  Allergen Reactions  . Amoxicillin Nausea And Vomiting and Rash  . Codeine Nausea And Vomiting and Rash  . Sulfonamide Derivatives Nausea And Vomiting and Rash    Physical Exam  Vitals  Blood pressure 129/72, pulse 72, temperature 98.1 F (36.7 C), temperature source Oral, resp. rate 20, height 5\' 5"  (1.651 m), weight 99.791 kg (220 lb), SpO2 99.00%.   1. General middle-aged obese white female lying in bed in NAD,    2. Normal affect and insight, Not Suicidal or Homicidal, Awake Alert,  Oriented X 3.  3. No F.N deficits, ALL C.Nerves Intact, Strength 5/5 all 4 extremities, Sensation intact all 4 extremities, Plantars down going.  4. Ears and Eyes appear Normal, Conjunctivae clear, PERRLA. Moist Oral Mucosa.  5. Supple Neck, No JVD, No cervical lymphadenopathy appriciated, No Carotid Bruits.  6. Symmetrical Chest wall movement, Good air movement bilaterally, CTAB.  7. RRR, No Gallops, Rubs or Murmurs, No Parasternal Heave.  8. Positive Bowel Sounds, Abdomen Soft, Non tender, No organomegaly appriciated,No rebound -guarding or rigidity.  9.  No Cyanosis, Normal Skin Turgor, No Skin Rash or Bruise.  10. Good muscle tone,  joints appear normal , no effusions, Normal ROM.  11. No Palpable Lymph Nodes in Neck or Axillae     Data Review  CBC  Recent Labs Lab 11/16/12 1220  WBC 4.7  HGB 11.6*  HCT 35.5*  PLT 230  MCV 86.8  MCH 28.4  MCHC 32.7  RDW 14.0  LYMPHSABS 1.4  MONOABS 0.3  EOSABS 0.1  BASOSABS 0.0   ------------------------------------------------------------------------------------------------------------------  Chemistries   Recent Labs Lab 11/16/12 1220  NA 136  K 3.9  CL 102  CO2 26  GLUCOSE 106*  BUN 20  CREATININE 0.69  CALCIUM 8.9   ------------------------------------------------------------------------------------------------------------------ estimated creatinine clearance is 88.6 ml/min (by C-G formula based on Cr of 0.69). ------------------------------------------------------------------------------------------------------------------ No results found for this basename: TSH, T4TOTAL, FREET3, T3FREE, THYROIDAB,  in the last 72 hours   Coagulation profile No results found for this basename: INR, PROTIME,  in the last 168 hours -------------------------------------------------------------------------------------------------------------------  Recent Labs  11/16/12 1220  DDIMER 0.94*    -------------------------------------------------------------------------------------------------------------------  Cardiac Enzymes  Recent Labs Lab 11/16/12 1220 11/16/12 1639  TROPONINI <0.30 <0.30   ------------------------------------------------------------------------------------------------------------------ No components found with this basename: POCBNP,    ---------------------------------------------------------------------------------------------------------------  Urinalysis No results found for this basename: colorurine, appearanceur, labspec, phurine, glucoseu, hgbur, bilirubinur, ketonesur, proteinur, urobilinogen, nitrite, leukocytesur    ----------------------------------------------------------------------------------------------------------------  Imaging results:   Ct Angio Chest W/cm &/or Wo Cm  11/16/2012   *RADIOLOGY REPORT*  Clinical Data: Chest pain  CT ANGIOGRAPHY CHEST  Technique:  Multidetector CT imaging of the chest using the standard protocol during bolus administration of intravenous contrast. Multiplanar reconstructed images including MIPs were obtained and reviewed to evaluate the vascular anatomy.  Contrast: OMNIPAQUE IOHEXOL 350 MG/ML SOLN  Comparison: Chest x-ray today  Findings: Negative for pulmonary embolism.  Aorta is normal in caliber and  there is no  aortic dissection.  Heart size is normal and there is no pericardial effusion.  No coronary calcification is identified.  Lungs are clear without infiltrate or effusion.  No mass or adenopathy.  Mild degenerative change in the thoracic spine.  IMPRESSION: Negative for pulmonary embolism.  No acute abnormality.   Original Report Authenticated By: Janeece Riggers, M.D.   Dg Chest Portable 1 View  11/16/2012   *RADIOLOGY REPORT*  Clinical Data: Chest pain.  High blood pressure.  Nonsmoker.  PORTABLE CHEST - 1 VIEW  Comparison: None.  Findings: Elevated right hemidiaphragm with limited evaluation  of the right lung base. The patient would eventually benefit from follow-up two-view chest with cardiac leads removed.  No infiltrate, congestive heart failure or pneumothorax.  Heart size within normal limits.  IMPRESSION: No acute abnormality.  Please see above.   Original Report Authenticated By: Lacy Duverney, M.D.    My personal review of EKG: Rhythm NSR, Rate  86 /min,   no Acute ST changes    Assessment & Plan    1. Chest pain. Patient does have risk factors personal and family history for CAD, she will be kept for 23 hour observation a telemetry bed, we will apply nitro paste for pain relief, place her on aspirin continue her home dose beta blocker, cycle troponins, I have discussed her case with cardiologist Dr. Loney Hering, he will be transferred to [facility] for his chemical stress test tomorrow morning.    2. Hypertension stable continue home medications   3. GERD continue PPI   DVT Prophylaxis  Heparin  AM Labs Ordered, also please review Full Orders  Family Communication: Admission, patients condition and plan of care including tests being ordered have been discussed with the patient and family who indicate understanding and agree with the plan and Code Status.  Code Status Full  Likely DC to  Home  Time spent in minutes : 35  Condition GUARDED  Leroy Sea M.D on 11/16/2012 at 5:30 PM  Between 7am to 7pm - Pager - [phone]  After 7pm go to [email or web address] - password [email or web address]  And look for the night coverage person covering me after hours  [facility] Office  [phone]

## 2012-11-17 ENCOUNTER — Inpatient Hospital Stay (HOSPITAL_COMMUNITY): Payer: PRIVATE HEALTH INSURANCE

## 2012-11-17 DIAGNOSIS — R079 Chest pain, unspecified: Secondary | ICD-10-CM

## 2012-11-17 LAB — CBC
MCH: 27.8 pg (ref 26.0–34.0)
MCV: 85.2 fL (ref 78.0–100.0)
Platelets: 184 10*3/uL (ref 150–400)
RBC: 3.71 MIL/uL — ABNORMAL LOW (ref 3.87–5.11)
RDW: 14.1 % (ref 11.5–15.5)
WBC: 4.7 10*3/uL (ref 4.0–10.5)

## 2012-11-17 LAB — BASIC METABOLIC PANEL
CO2: 26 mEq/L (ref 19–32)
Calcium: 8.5 mg/dL (ref 8.4–10.5)
Creatinine, Ser: 0.82 mg/dL (ref 0.50–1.10)
GFR calc non Af Amer: 77 mL/min — ABNORMAL LOW (ref >90)
Sodium: 137 mEq/L (ref 135–145)

## 2012-11-17 LAB — TROPONIN I: Troponin I: <0.3 ng/mL (ref <0.30)

## 2012-11-17 MED ORDER — PANTOPRAZOLE SODIUM 40 MG PO TBEC
40.0000 mg | DELAYED_RELEASE_TABLET | Freq: Every day | ORAL | Status: DC
  Administered 2012-11-17: 40 mg via ORAL
  Filled 2012-11-17: qty 1

## 2012-11-17 MED ORDER — TECHNETIUM TC 99M SESTAMIBI GENERIC - CARDIOLITE
10.0000 | Freq: Once | INTRAVENOUS | Status: AC | PRN
  Administered 2012-11-17: 10 via INTRAVENOUS

## 2012-11-17 MED ORDER — REGADENOSON 0.4 MG/5ML IV SOLN
0.4000 mg | Freq: Once | INTRAVENOUS | Status: AC
  Administered 2012-11-17: 0.4 mg via INTRAVENOUS

## 2012-11-17 MED ORDER — ESOMEPRAZOLE MAGNESIUM 20 MG PO PACK: 20.0000 mg | PACK | Freq: Every day | ORAL | Status: DC

## 2012-11-17 MED ORDER — NITROGLYCERIN 0.4 MG SL SUBL: 0.4000 mg | SUBLINGUAL_TABLET | SUBLINGUAL | Status: DC | PRN

## 2012-11-17 MED ORDER — TECHNETIUM TC 99M SESTAMIBI GENERIC - CARDIOLITE
30.0000 | Freq: Once | INTRAVENOUS | Status: AC | PRN
  Administered 2012-11-17: 30 via INTRAVENOUS

## 2012-11-17 NOTE — Progress Notes (Signed)
[facility]  Subjective: No further chest pain.   Objective: Vital signs in last 24 hours: Temp:  [97.5 F (36.4 C)-98.1 F (36.7 C)] 97.7 F (36.5 C) (05/17 0734) Pulse Rate:  [53-112] 61 (05/17 0734) Resp:  [13-30] 14 (05/17 0700) BP: (75-150)/(35-84) 114/44 mmHg (05/17 1147) SpO2:  [94 %-100 %] 100 % (05/17 0734) Weight:  [212 lb 15.4 oz (96.6 kg)-213 lb 3 oz (96.7 kg)] 213 lb 3 oz (96.7 kg) (05/17 0452)    Intake/Output from previous day: 05/16 0701 - 05/17 0700 In: 875.1 [P.O.:180; I.V.:695.1] Out: 400 [Urine:400] Intake/Output this shift: Total I/O In: 225 [I.V.:225] Out: 350 [Urine:350]  Medications Current Facility-Administered Medications  Medication Dose Route Frequency Provider Last Rate Last Dose  . 0.9 %  sodium chloride infusion   Intravenous Continuous Leroy Sea, MD 75 mL/hr at 11/16/12 2211    . albuterol (PROVENTIL) (5 MG/ML) 0.5% nebulizer solution 2.5 mg  2.5 mg Nebulization Q2H PRN Leroy Sea, MD      . aspirin EC tablet 325 mg  325 mg Oral Daily Leroy Sea, MD   325 mg at 11/17/12 0951  . gi cocktail (Maalox,Lidocaine,Donnatal)  30 mL Oral BID Leroy Sea, MD   30 mL at 11/17/12 0951  . guaiFENesin-dextromethorphan (ROBITUSSIN DM) 100-10 MG/5ML syrup 5 mL  5 mL Oral Q4H PRN Leroy Sea, MD      . heparin injection 5,000 Units  5,000 Units Subcutaneous Q8H Leroy Sea, MD   5,000 Units at 11/17/12 4782  . hydrochlorothiazide (HYDRODIURIL) tablet 25 mg  25 mg Oral Daily Leroy Sea, MD      . HYDROcodone-acetaminophen (NORCO/VICODIN) 5-325 MG per tablet 1-2 tablet  1-2 tablet Oral Q4H PRN Leroy Sea, MD      . lisinopril (PRINIVIL,ZESTRIL) tablet 20 mg  20 mg Oral Daily Leroy Sea, MD   20 mg at 11/17/12 0951  . morphine 2 MG/ML injection 2 mg  2 mg Intravenous Q4H PRN Leroy Sea, MD      . nitroGLYCERIN (NITROGLYN) 2 % ointment 1 inch  1 inch Topical Q6H Leroy Sea, MD   1 inch at 11/17/12 0550  . nitroGLYCERIN 0.2 mg/mL in dextrose 5 % infusion  2-200 mcg/min Intravenous Titrated Rolan Lipa, NP   10 mcg/min at 11/17/12 0145  . ondansetron (ZOFRAN) tablet 4 mg  4 mg Oral Q6H PRN Leroy Sea, MD       Or  . ondansetron (ZOFRAN) injection 4 mg  4 mg Intravenous Q6H PRN Leroy Sea, MD      . pantoprazole (PROTONIX) EC tablet 40 mg  40 mg Oral Daily Chrystie Nose, MD   40 mg at 11/17/12 0951  . polyethylene glycol (MIRALAX / GLYCOLAX) packet 17 g  17 g Oral Daily PRN Leroy Sea, MD      . sodium chloride 0.9 % injection 3 mL  3 mL Intravenous Q12H Leroy Sea, MD   3 mL at 11/17/12 0954    PE: General appearance: alert, cooperative and no distress Lungs: clear to auscultation bilaterally Heart: regular rate and rhythm, S1, S2 normal, no murmur, click, rub or gallop Extremities: no LEE Pulses: 2+ and symmetric Skin: warm and dry Neurologic: Grossly normal  Lab Results:   Recent Labs  11/16/12 1220 11/17/12 0515  WBC 4.7 4.7  HGB 11.6* 10.3*  HCT 35.5* 31.6*  PLT 230 184  BMET  Recent Labs  11/16/12 1220 11/17/12 0515  NA 136 137  K 3.9 3.9  CL 102 105  CO2 26 26  GLUCOSE 106* 86  BUN 20 19  CREATININE 0.69 0.82  CALCIUM 8.9 8.5   Cardiac Panel (last 3 results)  Recent Labs  11/16/12 1755 11/16/12 2307 11/17/12 0515  TROPONINI <0.30 <0.30 <0.30    Assessment/Plan  Principal Problem:   Chest pain Active Problems:   HIGH BLOOD PRESSURE   HTN (hypertension)   Acid reflux   PVD (peripheral vascular disease)  Plan: pt ruled out for MI. Pt just underwent NST. She tolerated the procedure well. She had only mild nausea and a metallic taste in mouth, which both resolved by completion of the test. No EKG changes noted. Radiologist interpretation to follow. If low risk study, can plan for discharge home with F/U at [facility].    LOS: 1 day    Brittainy M. Sharol Harness,  PA-C 11/17/2012 11:54 AM  I have seen and examined the patient along with Brittainy M. Sharol Harness, PA-C.  I have reviewed the chart, notes and new data.  I agree with PA's note.  Key new complaints: angina type chest pain with Lexiscan infusion (nonspecific, but concerning) Key examination changes: no arrhythmia or clinical signs CHF Key new findings / data: normal myocardial perfusion study, EF 75%  PLAN: I would try empirical Rx with PPI for what is most likely a GI cause of pain. However, it is not unreasonable to prescribe long acting nitrates for what may be small vessel or secondary branch CAD that could be missed by the nuclear study. If symptoms persist, she may eventually require coronary angiography to clarify the diagnosis, but prognosis is good with the normal nuclear scan.  Thurmon Fair, MD, [facility] [phone] 11/17/2012, 2:01 PM

## 2012-11-17 NOTE — Discharge Summary (Signed)
DISCHARGE SUMMARY  Susan Oneill  MR#: [id number]  DOB:04-Jun-1951  Date of Admission: 11/16/2012 Date of Discharge: 11/17/2012  Attending Physician:Torryn Fiske T  Patient's NWG:NFAOZH,YQMVH, MD  Consults:  Cardiology - [facility]  Disposition: D/C Home   Follow-up Appts:     Follow-up Information   Follow up with LUKING,SCOTT, MD. Schedule an appointment as soon as possible for a visit in 7 days.   Contact information:   [address] [place] [address] [phone]      Discharge Diagnoses: Aytpical chest pain - rulled out for AMI - negative nuc med stress test HTN GERD PVD  Initial presentation: 62 y.o. female, with history of hypertension and peripheral vascular disease who presented to the hospital with 12-14 hour history of left-sided pressure-like sensation radiating to her scapula.  This started early the morning of her admit while she was sleeping.  Sensation was constant, no aggravating or relieving factors.  She presented to the ER as the pain/pressure was not getting better.  In the ER her d-dimer was slightly elevated, a CT angiogram was obtained which was unremarkable.  Her initial EKG and troponin were unremarkable.  Her symptoms improved after she received nitroglycerin.   Hospital Course: The patient was admitted to the acute units after being transferred from [facility].  Cardiology was consulted.  The patient ruled out for acute MI with serial enzymes and EKG.  CT angiogram of the chest was accomplished and revealed no coronary artery calcifications and there was no evidence of pulmonary embolism.  The patient was taken for nuclear medicine stress test.  This proved to be normal with no evidence of reversible ischemia.  The patient has therefore been cleared for discharge home.  Her usual Zantac has been changed to prescription PPI.  She is advised to followup with her primary care physician in 7-10 days.  If her pain persists GI  evaluation would be reasonable.  Cardiology has also commented that small vessel or secondary branch coronary disease would be missed by nuclear study and that a trial of long-acting nitrates would also be reasonable if the patient's pain returns/persists.  At the time of discharge the patient's symptoms have resolved.  She denies chest pain shortness of breath fevers chills nausea or vomiting.     Medication List    STOP taking these medications       ranitidine 300 MG tablet  Commonly known as:  ZANTAC      TAKE these medications       esomeprazole 20 MG packet  Commonly known as:  NEXIUM  Take 20 mg by mouth daily before breakfast.     hydrochlorothiazide 25 MG tablet  Commonly known as:  HYDRODIURIL  Take 25 mg by mouth daily.     HYDROcodone-acetaminophen 5-325 MG per tablet  Commonly known as:  NORCO/VICODIN  Take 1 tablet by mouth every 6 (six) hours as needed for pain.     lisinopril 20 MG tablet  Commonly known as:  PRINIVIL,ZESTRIL  Take 20 mg by mouth daily.     naproxen 500 MG tablet  Commonly known as:  NAPROSYN  Take 500 mg by mouth 2 (two) times daily with a meal.     nitroGLYCERIN 0.4 MG SL tablet  Commonly known as:  NITROSTAT  Place 1 tablet (0.4 mg total) under the tongue every 5 (five) minutes as needed for chest pain.     traMADol 50 MG tablet  Commonly known as:  ULTRAM  Take  50 mg by mouth every 6 (six) hours as needed for pain.       Day of Discharge BP 133/61  Pulse 79  Temp(Src) 97.7 F (36.5 C) (Oral)  Resp 16  Ht 5\' 5"  (1.651 m)  Wt 96.7 kg (213 lb 3 oz)  BMI 35.48 kg/m2  SpO2 100%  Physical Exam: General: No acute respiratory distress Lungs: Clear to auscultation bilaterally without wheezes or crackles Cardiovascular: Regular rate and rhythm without murmur gallop or rub normal S1 and S2 Abdomen: Nontender, nondistended, soft, bowel sounds positive, no rebound, no ascites, no appreciable mass Extremities: No significant  cyanosis, clubbing, or edema bilateral lower extremities  Results for orders placed during the hospital encounter of 11/16/12 (from the past 24 hour(s))  TROPONIN I     Status: None   Collection Time    11/16/12  4:39 PM      Result Value Range   Troponin I <0.30  <0.30 ng/mL  TROPONIN I     Status: None   Collection Time    11/16/12  5:55 PM      Result Value Range   Troponin I <0.30  <0.30 ng/mL  MRSA PCR SCREENING     Status: None   Collection Time    11/16/12  9:20 PM      Result Value Range   MRSA by PCR NEGATIVE  NEGATIVE  TROPONIN I     Status: None   Collection Time    11/16/12 11:07 PM      Result Value Range   Troponin I <0.30  <0.30 ng/mL  TROPONIN I     Status: None   Collection Time    11/17/12  5:15 AM      Result Value Range   Troponin I <0.30  <0.30 ng/mL  BASIC METABOLIC PANEL     Status: Abnormal   Collection Time    11/17/12  5:15 AM      Result Value Range   Sodium 137  135 - 145 mEq/L   Potassium 3.9  3.5 - 5.1 mEq/L   Chloride 105  96 - 112 mEq/L   CO2 26  19 - 32 mEq/L   Glucose, Bld 86  70 - 99 mg/dL   BUN 19  6 - 23 mg/dL   Creatinine, Ser 8.46  0.50 - 1.10 mg/dL   Calcium 8.5  8.4 - 96.2 mg/dL   GFR calc non Af Amer 77 (*) >90 mL/min   GFR calc Af Amer 89 (*) >90 mL/min  CBC     Status: Abnormal   Collection Time    11/17/12  5:15 AM      Result Value Range   WBC 4.7  4.0 - 10.5 K/uL   RBC 3.71 (*) 3.87 - 5.11 MIL/uL   Hemoglobin 10.3 (*) 12.0 - 15.0 g/dL   HCT 95.2 (*) 84.1 - 32.4 %   MCV 85.2  78.0 - 100.0 fL   MCH 27.8  26.0 - 34.0 pg   MCHC 32.6  30.0 - 36.0 g/dL   RDW 40.1  02.7 - 25.3 %   Platelets 184  150 - 400 K/uL    Time spent in discharge (includes decision making & examination of pt): 30 minutes  11/17/2012, 4:26 PM   Lonia Blood, MD [facility] Office  [phone] Pager [phone]  On-Call/Text Page:      [email or web address]      password [email or web address]

## 2012-11-17 NOTE — Progress Notes (Signed)
Patient sleeping.  BP dropped to 75/45 Map 46.  NTG paused.  Repeat BP 100/52 after patient awakened.  Patient states pain isn't as tight but still 3/10.  Continue to monitor.

## 2012-11-17 NOTE — Consult Note (Addendum)
[facility]       CONSULTATION NOTE   Reason for Consult: Chest pain  Requesting Physician: Dr. Thedore Mins  Cardiologist: Gentry Fitz  HPI: This is a 62 y.o. female with a past medical history significant for hypertension, bilateral knee osteoarthritis, GERD and varicose veins.  She reports taking her daughter back to [place] on Monday and when she flew back she had problems with stomach upset, nausea and feeling unwell. She has been taking zantac twice daily instead of once and additionally is taking tums. Her symptoms improved somewhat, however, she awoke with a burning mid-chest discomfort that went under the left breast and radiated to her back. She took a vicodin without relief. She did not get back to sleep and went to work. The pain persisted and was constant - she presented to the ER and was eventually given a GI cocktail and nitroglycerin patch (simultaneously) and had relief within 5-10 minutes. She is chest pain free now  PMHx:  Past Medical History  Diagnosis Date  . HTN (hypertension)   . Acid reflux   . PVD (peripheral vascular disease)    Past Surgical History  Procedure Laterality Date  . Knee surgery      bilateral  . Carpal tunnel release      bilateral  . Hernia repair    . Hemorroidectomy      FAMHx: Family History  Problem Relation Age of Onset  . Arthritis    . Heart disease      SOCHx:  reports that she has never smoked. She does not have any smokeless tobacco history on file. She reports that she does not drink alcohol or use illicit drugs.  ALLERGIES: Allergies  Allergen Reactions  . Amoxicillin Nausea And Vomiting and Rash  . Codeine Nausea And Vomiting and Rash  . Sulfonamide Derivatives Nausea And Vomiting and Rash    ROS: A comprehensive review of systems was negative except for: Cardiovascular: positive for chest pain Gastrointestinal: positive for dyspepsia and reflux symptoms  HOME  MEDICATIONS: Prescriptions prior to admission  Medication Sig Dispense Refill  . hydrochlorothiazide (HYDRODIURIL) 25 MG tablet Take 25 mg by mouth daily.      Marland Kitchen HYDROcodone-acetaminophen (NORCO/VICODIN) 5-325 MG per tablet Take 1 tablet by mouth every 6 (six) hours as needed for pain.      Marland Kitchen lisinopril (PRINIVIL,ZESTRIL) 20 MG tablet Take 20 mg by mouth daily.      . naproxen (NAPROSYN) 500 MG tablet Take 500 mg by mouth 2 (two) times daily with a meal.      . ranitidine (ZANTAC) 300 MG tablet Take 300 mg by mouth daily.      . traMADol (ULTRAM) 50 MG tablet Take 50 mg by mouth every 6 (six) hours as needed for pain.        HOSPITAL MEDICATIONS: Prior to Admission:  Prescriptions prior to admission  Medication Sig Dispense Refill  . hydrochlorothiazide (HYDRODIURIL) 25 MG tablet Take 25 mg by mouth daily.      Marland Kitchen HYDROcodone-acetaminophen (NORCO/VICODIN) 5-325 MG per tablet Take 1 tablet by mouth every 6 (six) hours as needed for pain.      Marland Kitchen lisinopril (PRINIVIL,ZESTRIL) 20 MG tablet Take 20 mg by mouth daily.      . naproxen (NAPROSYN) 500 MG tablet Take 500 mg by mouth 2 (two) times daily with a meal.      . ranitidine (ZANTAC) 300 MG tablet Take 300 mg by mouth daily.      Marland Kitchen  traMADol (ULTRAM) 50 MG tablet Take 50 mg by mouth every 6 (six) hours as needed for pain.        VITALS: Blood pressure 93/47, pulse 59, temperature 97.5 F (36.4 C), temperature source Oral, resp. rate 13, height 5\' 5"  (1.651 m), weight 213 lb 3 oz (96.7 kg), SpO2 96.00%.  PHYSICAL EXAM: General appearance: alert and no distress Neck: no adenopathy, no carotid bruit, no JVD, supple, symmetrical, trachea midline and thyroid not enlarged, symmetric, no tenderness/mass/nodules Lungs: clear to auscultation bilaterally Heart: regular rate and rhythm, S1, S2 normal, no murmur, click, rub or gallop Abdomen: obese, soft, non-tender Extremities: edema trace edema, numerous reticular veins Pulses: 2+ and  symmetric Skin: Skin color, texture, turgor normal. No rashes or lesions Neurologic: Grossly normal Psychiatric: Mood and affect normal  LABS: Results for orders placed during the hospital encounter of 11/16/12 (from the past 48 hour(s))  CBC WITH DIFFERENTIAL     Status: Abnormal   Collection Time    11/16/12 12:20 PM      Result Value Range   WBC 4.7  4.0 - 10.5 K/uL   RBC 4.09  3.87 - 5.11 MIL/uL   Hemoglobin 11.6 (*) 12.0 - 15.0 g/dL   HCT 45.4 (*) 09.8 - 11.9 %   MCV 86.8  78.0 - 100.0 fL   MCH 28.4  26.0 - 34.0 pg   MCHC 32.7  30.0 - 36.0 g/dL   RDW 14.7  82.9 - 56.2 %   Platelets 230  150 - 400 K/uL   Neutrophils Relative % 61  43 - 77 %   Neutro Abs 2.8  1.7 - 7.7 K/uL   Lymphocytes Relative 31  12 - 46 %   Lymphs Abs 1.4  0.7 - 4.0 K/uL   Monocytes Relative 6  3 - 12 %   Monocytes Absolute 0.3  0.1 - 1.0 K/uL   Eosinophils Relative 2  0 - 5 %   Eosinophils Absolute 0.1  0.0 - 0.7 K/uL   Basophils Relative 1  0 - 1 %   Basophils Absolute 0.0  0.0 - 0.1 K/uL  BASIC METABOLIC PANEL     Status: Abnormal   Collection Time    11/16/12 12:20 PM      Result Value Range   Sodium 136  135 - 145 mEq/L   Potassium 3.9  3.5 - 5.1 mEq/L   Chloride 102  96 - 112 mEq/L   CO2 26  19 - 32 mEq/L   Glucose, Bld 106 (*) 70 - 99 mg/dL   BUN 20  6 - 23 mg/dL   Creatinine, Ser 1.30  0.50 - 1.10 mg/dL   Calcium 8.9  8.4 - 86.5 mg/dL   GFR calc non Af Amer >90  >90 mL/min   GFR calc Af Amer >90  >90 mL/min   Comment:            The eGFR has been calculated     using the CKD EPI equation.     This calculation has not been     validated in all clinical     situations.     eGFR's persistently     <90 mL/min signify     possible Chronic Kidney Disease.  TROPONIN I     Status: None   Collection Time    11/16/12 12:20 PM      Result Value Range   Troponin I <0.30  <0.30 ng/mL   Comment:  Due to the release kinetics of cTnI,     a negative result within the first hours      of the onset of symptoms does not rule out     myocardial infarction with certainty.     If myocardial infarction is still suspected,     repeat the test at appropriate intervals.  D-DIMER, QUANTITATIVE     Status: Abnormal   Collection Time    11/16/12 12:20 PM      Result Value Range   D-Dimer, Quant 0.94 (*) 0.00 - 0.48 ug/mL-FEU   Comment:            AT THE INHOUSE ESTABLISHED CUTOFF     VALUE OF 0.48 ug/mL FEU,     THIS ASSAY HAS BEEN DOCUMENTED     IN THE LITERATURE TO HAVE     A SENSITIVITY AND NEGATIVE     PREDICTIVE VALUE OF AT LEAST     98 TO 99%.  THE TEST RESULT     SHOULD BE CORRELATED WITH     AN ASSESSMENT OF THE CLINICAL     PROBABILITY OF DVT / VTE.  TROPONIN I     Status: None   Collection Time    11/16/12  4:39 PM      Result Value Range   Troponin I <0.30  <0.30 ng/mL   Comment:            Due to the release kinetics of cTnI,     a negative result within the first hours     of the onset of symptoms does not rule out     myocardial infarction with certainty.     If myocardial infarction is still suspected,     repeat the test at appropriate intervals.  TROPONIN I     Status: None   Collection Time    11/16/12  5:55 PM      Result Value Range   Troponin I <0.30  <0.30 ng/mL   Comment:            Due to the release kinetics of cTnI,     a negative result within the first hours     of the onset of symptoms does not rule out     myocardial infarction with certainty.     If myocardial infarction is still suspected,     repeat the test at appropriate intervals.  MRSA PCR SCREENING     Status: None   Collection Time    11/16/12  9:20 PM      Result Value Range   MRSA by PCR NEGATIVE  NEGATIVE   Comment:            The GeneXpert MRSA Assay (FDA     approved for NASAL specimens     only), is one component of a     comprehensive MRSA colonization     surveillance program. It is not     intended to diagnose MRSA     infection nor to guide or      monitor treatment for     MRSA infections.  TROPONIN I     Status: None   Collection Time    11/16/12 11:07 PM      Result Value Range   Troponin I <0.30  <0.30 ng/mL   Comment:            Due to the release kinetics of cTnI,     a negative result within  the first hours     of the onset of symptoms does not rule out     myocardial infarction with certainty.     If myocardial infarction is still suspected,     repeat the test at appropriate intervals.  TROPONIN I     Status: None   Collection Time    11/17/12  5:15 AM      Result Value Range   Troponin I <0.30  <0.30 ng/mL   Comment:            Due to the release kinetics of cTnI,     a negative result within the first hours     of the onset of symptoms does not rule out     myocardial infarction with certainty.     If myocardial infarction is still suspected,     repeat the test at appropriate intervals.  BASIC METABOLIC PANEL     Status: Abnormal   Collection Time    11/17/12  5:15 AM      Result Value Range   Sodium 137  135 - 145 mEq/L   Potassium 3.9  3.5 - 5.1 mEq/L   Chloride 105  96 - 112 mEq/L   CO2 26  19 - 32 mEq/L   Glucose, Bld 86  70 - 99 mg/dL   BUN 19  6 - 23 mg/dL   Creatinine, Ser 1.61  0.50 - 1.10 mg/dL   Calcium 8.5  8.4 - 09.6 mg/dL   GFR calc non Af Amer 77 (*) >90 mL/min   GFR calc Af Amer 89 (*) >90 mL/min   Comment:            The eGFR has been calculated     using the CKD EPI equation.     This calculation has not been     validated in all clinical     situations.     eGFR's persistently     <90 mL/min signify     possible Chronic Kidney Disease.  CBC     Status: Abnormal   Collection Time    11/17/12  5:15 AM      Result Value Range   WBC 4.7  4.0 - 10.5 K/uL   RBC 3.71 (*) 3.87 - 5.11 MIL/uL   Hemoglobin 10.3 (*) 12.0 - 15.0 g/dL   HCT 04.5 (*) 40.9 - 81.1 %   MCV 85.2  78.0 - 100.0 fL   MCH 27.8  26.0 - 34.0 pg   MCHC 32.6  30.0 - 36.0 g/dL   RDW 91.4  78.2 - 95.6 %   Platelets  184  150 - 400 K/uL    IMAGING: Ct Angio Chest W/cm &/or Wo Cm  11/16/2012   *RADIOLOGY REPORT*  Clinical Data: Chest pain  CT ANGIOGRAPHY CHEST  Technique:  Multidetector CT imaging of the chest using the standard protocol during bolus administration of intravenous contrast. Multiplanar reconstructed images including MIPs were obtained and reviewed to evaluate the vascular anatomy.  Contrast: OMNIPAQUE IOHEXOL 350 MG/ML SOLN  Comparison: Chest x-ray today  Findings: Negative for pulmonary embolism.  Aorta is normal in caliber and  there is no aortic dissection.  Heart size is normal and there is no pericardial effusion.  No coronary calcification is identified.  Lungs are clear without infiltrate or effusion.  No mass or adenopathy.  Mild degenerative change in the thoracic spine.  IMPRESSION: Negative for pulmonary embolism.  No acute abnormality.   Original Report  Authenticated By: Janeece Riggers, M.D.   Dg Chest Portable 1 View  11/16/2012   *RADIOLOGY REPORT*  Clinical Data: Chest pain.  High blood pressure.  Nonsmoker.  PORTABLE CHEST - 1 VIEW  Comparison: None.  Findings: Elevated right hemidiaphragm with limited evaluation of the right lung base. The patient would eventually benefit from follow-up two-view chest with cardiac leads removed.  No infiltrate, congestive heart failure or pneumothorax.  Heart size within normal limits.  IMPRESSION: No acute abnormality.  Please see above.   Original Report Authenticated By: Lacy Duverney, M.D.    IMPRESSION: 1. Chest pain, more concerning for GERD 2. Hypertension 3. GERD 4. Negative CTA without coronary calcium  RECOMMENDATION: 1. Her symptoms are more reminiscent of GERD - constant chest pain that did not worsen with exertion or improve with rest, under the left breast, radiating to the back. She has required BID zantac and tums the last week for relief of her symptoms. CTA was negative for PE. No coronary calcium was noted. I agree with  stress testing today, but suspect she is lower risk. I have switched her to protonix.  If her stres test is negative, she could likely be discharged later today. 5 sets of troponin are negative.  Time Spent Directly with Patient: 15 minutes  Chrystie Nose, MD, [facility] Attending Cardiologist [facility]  Kyian Obst C 11/17/2012, 6:36 AM

## 2012-11-17 NOTE — Progress Notes (Signed)
[facility] hospitalist progress note. Chief complaint. Transfer note. History of present illness. This 62 year old female was admitted through [facility]. Her presenting complaint was chest pain. Treatment plan is for nitroglycerin paste for pain relief, aspirin, beta blocker, and cycle troponins. Cardiologist Doctor Critpru was consulted on her case and requested the patient be transferred to [facility] for a chemical stress test in the morning. I'm seeing the patient post transfer and arrival at [facility] to ensure she remains clinically stable and that her orders transferred appropriately. Patient is still complaining of mild chest pain under the left breast which appears somewhat reproducible with palpation. Vital signs. Temperature 97.9, pulse 71, respirations 17, blood pressure 104/78. O2 sats 99%. General appearance. Moderately obese elderly female who is alert, cooperative, and in no distress. Cardiac. Rate and rhythm regular. Lungs. Breath sounds clear and equal. Abdomen. Soft and obese with positive bowel sounds. No pain. Impression/plan. Problem #1. Chest pain. Patient does have known personal and familial risk factors for coronary artery disease. We'll follow on telemetry and for results of ordered troponins.Two of the troponins have resulted in more less than 0.3/negative. Will follow for cardiology's recommendations later this a.m. Patient appears clinically stable per bedside evaluation and all orders appear to have transferred appropriately.

## 2012-11-19 ENCOUNTER — Other Ambulatory Visit: Payer: Self-pay | Admitting: Family Medicine

## 2012-11-19 NOTE — Telephone Encounter (Signed)
Ok times three total

## 2012-11-20 NOTE — Telephone Encounter (Signed)
Sent RX escribed to pharmacy.

## 2012-11-23 ENCOUNTER — Encounter: Payer: Self-pay | Admitting: *Deleted

## 2012-11-23 ENCOUNTER — Ambulatory Visit: Payer: Self-pay | Admitting: Family Medicine

## 2012-11-28 ENCOUNTER — Encounter: Payer: Self-pay | Admitting: Family Medicine

## 2012-11-28 ENCOUNTER — Ambulatory Visit (INDEPENDENT_AMBULATORY_CARE_PROVIDER_SITE_OTHER): Payer: PRIVATE HEALTH INSURANCE | Admitting: Family Medicine

## 2012-11-28 VITALS — BP 130/88 | HR 80 | Ht 65.0 in | Wt 218.0 lb

## 2012-11-28 DIAGNOSIS — R079 Chest pain, unspecified: Secondary | ICD-10-CM

## 2012-11-28 DIAGNOSIS — Z8679 Personal history of other diseases of the circulatory system: Secondary | ICD-10-CM

## 2012-11-28 DIAGNOSIS — K219 Gastro-esophageal reflux disease without esophagitis: Secondary | ICD-10-CM

## 2012-11-28 NOTE — Progress Notes (Signed)
  Subjective:    Patient ID: Susan Oneill, female    DOB: 20-Aug-1950, 62 y.o.   MRN: [id number]  Chest Pain  This is a new problem. The current episode started in the past 7 days. The onset quality is sudden. The problem has been gradually worsening. The patient is experiencing no pain.  Patient is here for hospital visit follow up for chest discomfort. Was admitted to [facility] on 11/16/2012.  138/74 Test were reviewed with patient Recent flight with fatigue and thirst Test negative for pulm emb. PMH GERD Review of Systems  Cardiovascular: Positive for chest pain.  moderate GERD sx      Objective:   Physical Exam  Vitals reviewed. Constitutional: She appears well-developed.  HENT:  Head: Normocephalic.  Mouth/Throat: No oropharyngeal exudate.  Neck: Normal range of motion.  Cardiovascular: Normal rate and normal heart sounds.   No murmur heard. Pulmonary/Chest: Effort normal and breath sounds normal. No respiratory distress. She has no wheezes.  Abdominal: Soft.  Lymphadenopathy:    She has no cervical adenopathy.          Assessment & Plan:  25 minutes reviewing results with pt and discussing current health GERD- continue omeprazole No cardiac disease Follow up 6 months

## 2013-01-22 ENCOUNTER — Other Ambulatory Visit: Payer: Self-pay | Admitting: Family Medicine

## 2013-01-23 NOTE — Telephone Encounter (Signed)
Last seen 11/28/12

## 2013-01-23 NOTE — Telephone Encounter (Signed)
Refill x 3 

## 2013-02-28 ENCOUNTER — Other Ambulatory Visit: Payer: Self-pay | Admitting: *Deleted

## 2013-02-28 MED ORDER — TRAMADOL HCL 50 MG PO TABS: 50.0000 mg | ORAL_TABLET | Freq: Four times a day (QID) | ORAL | Status: DC | PRN

## 2013-03-05 ENCOUNTER — Telehealth: Payer: Self-pay | Admitting: Family Medicine

## 2013-03-05 NOTE — Telephone Encounter (Signed)
[facility] in [place] informed patient they have not received a refill for traMADol (ULTRAM) 50 MG tablet.    Our records show this was completed on 02/28/2013

## 2013-03-05 NOTE — Telephone Encounter (Signed)
Rx was re faxed to [facility] in [place].

## 2013-04-03 ENCOUNTER — Other Ambulatory Visit: Payer: Self-pay | Admitting: Family Medicine

## 2013-04-03 NOTE — Telephone Encounter (Signed)
May give her one refill. When she needs additional refill she ought to have the office visit as well this medication is now scheduled to in order to prescribe that we have to show that we are regularly following a person.

## 2013-04-10 ENCOUNTER — Ambulatory Visit: Payer: PRIVATE HEALTH INSURANCE | Admitting: Nurse Practitioner

## 2013-04-11 ENCOUNTER — Encounter: Payer: Self-pay | Admitting: Family Medicine

## 2013-04-11 ENCOUNTER — Ambulatory Visit (INDEPENDENT_AMBULATORY_CARE_PROVIDER_SITE_OTHER): Payer: PRIVATE HEALTH INSURANCE | Admitting: Family Medicine

## 2013-04-11 VITALS — BP 130/82 | Ht 65.0 in | Wt 210.0 lb

## 2013-04-11 DIAGNOSIS — M1710 Unilateral primary osteoarthritis, unspecified knee: Secondary | ICD-10-CM

## 2013-04-11 DIAGNOSIS — M1711 Unilateral primary osteoarthritis, right knee: Secondary | ICD-10-CM

## 2013-04-11 DIAGNOSIS — IMO0002 Reserved for concepts with insufficient information to code with codable children: Secondary | ICD-10-CM

## 2013-04-11 MED ORDER — PREDNISONE 20 MG PO TABS: ORAL_TABLET | ORAL | Status: AC

## 2013-04-11 MED ORDER — HYDROCODONE-ACETAMINOPHEN 5-325 MG PO TABS: ORAL_TABLET | ORAL | Status: DC

## 2013-04-11 NOTE — Progress Notes (Signed)
  Subjective:    Patient ID: Susan Oneill, female    DOB: [birth year], 62 y.o.   MRN: [id number]  HPI Patient arrives with right knee pain -cant get in with ortho to set up knee replacement till December. Patient is unable to go up and down steps she is unable to go up and down ladders she is walking currently with a walker. She complains of pain in the. She's been diagnosed with having severe osteoarthritis and has been told that there is little that can be done for her other than surgery. Unfortunately she cannot get surgery until December. She denies any high fevers chills sweats or any other signs of infection or complications. Past medical history benign. Family history benign.  Review of Systems She complains of pain and knee swelling and discomfort.    Objective:   Physical Exam  Severe crepitus noted in the right knee ligaments appear stable half normal fine normal ankle normal no swelling. Patient using a walker today. Has difficult time getting onto and off the exam table.      Assessment & Plan:  Right knee pain-patient is unable to do any type of work currently. Work excuse was given for her. She needs a knee replacement. We will discuss with Dr.Alusio office and asked them to see if there is any way they can get her in relatively quickly

## 2013-04-22 DIAGNOSIS — Z0289 Encounter for other administrative examinations: Secondary | ICD-10-CM

## 2013-04-23 DIAGNOSIS — Z0289 Encounter for other administrative examinations: Secondary | ICD-10-CM

## 2013-04-30 ENCOUNTER — Other Ambulatory Visit: Payer: Self-pay | Admitting: Family Medicine

## 2013-05-28 ENCOUNTER — Encounter: Payer: Self-pay | Admitting: Family Medicine

## 2013-05-28 ENCOUNTER — Ambulatory Visit (INDEPENDENT_AMBULATORY_CARE_PROVIDER_SITE_OTHER): Payer: PRIVATE HEALTH INSURANCE | Admitting: Family Medicine

## 2013-05-28 VITALS — BP 122/80 | Ht 65.0 in | Wt 219.1 lb

## 2013-05-28 DIAGNOSIS — E785 Hyperlipidemia, unspecified: Secondary | ICD-10-CM

## 2013-05-28 DIAGNOSIS — L501 Idiopathic urticaria: Secondary | ICD-10-CM

## 2013-05-28 DIAGNOSIS — I10 Essential (primary) hypertension: Secondary | ICD-10-CM

## 2013-05-28 DIAGNOSIS — M25569 Pain in unspecified knee: Secondary | ICD-10-CM

## 2013-05-28 DIAGNOSIS — K219 Gastro-esophageal reflux disease without esophagitis: Secondary | ICD-10-CM

## 2013-05-28 DIAGNOSIS — M25562 Pain in left knee: Secondary | ICD-10-CM

## 2013-05-28 MED ORDER — LISINOPRIL 20 MG PO TABS: 20.0000 mg | ORAL_TABLET | Freq: Every day | ORAL | Status: DC

## 2013-05-28 MED ORDER — HYDROCODONE-ACETAMINOPHEN 5-325 MG PO TABS: ORAL_TABLET | ORAL | Status: DC

## 2013-05-28 MED ORDER — NAPROXEN 500 MG PO TABS: ORAL_TABLET | ORAL | Status: DC

## 2013-05-28 MED ORDER — HYDROCHLOROTHIAZIDE 25 MG PO TABS: 25.0000 mg | ORAL_TABLET | Freq: Every day | ORAL | Status: DC

## 2013-05-28 NOTE — Progress Notes (Signed)
  Subjective:    Patient ID: ARRYN Oneill, female    DOB: 1950/12/24, 62 y.o.   MRN: [id number]  Hypertension This is a chronic problem. The current episode started more than 1 year ago. The problem has been gradually improving since onset. The problem is controlled. There are no associated agents to hypertension. There are no known risk factors for coronary artery disease. Treatments tried: lisinopril. The current treatment provides significant improvement. There are no compliance problems.   Patient is here to discuss blood work results she had done at her job. Also needs paperwork signed for employer. Patient states she has a knot on her left hand that is very painful. It has been present for about 2 months now.  Has history of hypertension also a history of hyperlipidemia. Also suffers with obesity and osteoarthritis of her knees she is seeing a orthopedic specialist hopefully in December for possible surgery Patient does not smoke or drink.   Review of Systems Denies rectal bleeding denies chest tightness pressure pain denies vomiting diarrhea wheezing difficulty breathing. Does relate joint pains in both knees worse on the left than the right    Objective:   Physical Exam Lungs are clear no crackles respiratory rate normal Heart is regular no murmurs pulse normal Abdomen soft Extremities no edema Significant osteoarthritis both knees worse on the left than the right  Her recent lab work including cholesterol hemoglobin A1c was all reviewed with the patient. Overall her numbers look good.       Assessment & Plan:  #1 osteoarthritis she may use hydrocodone anywhere from 13 times per day cautioned drowsiness #90. 2 prescriptions given. #2 reflux she is currently using Nexium OTC this seems to help her she states #3 hypertension she will continue her current medications watch her diet in minimizing salt Osteoarthritis of the knees he may use the Naprosyn as  necessary Hyperlipidemia not severe enough for medication No sign of diabetes Colonoscopy was recommended highly.

## 2013-06-24 ENCOUNTER — Encounter: Payer: Self-pay | Admitting: Family Medicine

## 2013-06-24 ENCOUNTER — Ambulatory Visit (INDEPENDENT_AMBULATORY_CARE_PROVIDER_SITE_OTHER): Payer: PRIVATE HEALTH INSURANCE | Admitting: Family Medicine

## 2013-06-24 VITALS — BP 124/80 | Temp 98.3°F | Ht 65.0 in | Wt 217.0 lb

## 2013-06-24 DIAGNOSIS — J019 Acute sinusitis, unspecified: Secondary | ICD-10-CM

## 2013-06-24 MED ORDER — LEVOFLOXACIN 500 MG PO TABS: 500.0000 mg | ORAL_TABLET | Freq: Every day | ORAL | Status: AC

## 2013-06-24 NOTE — Progress Notes (Signed)
   Subjective:    Patient ID: Susan Oneill, female    DOB: 1951/04/06, 62 y.o.   MRN: [id number]  Sinusitis This is a new problem. The current episode started 1 to 4 weeks ago. The problem is unchanged. There has been no fever. The pain is moderate. Associated symptoms include congestion, coughing, headaches and sinus pressure. Pertinent negatives include no ear pain or shortness of breath. Past treatments include oral decongestants. The treatment provided mild relief.   PMH benign, chronic knee pain   Review of Systems  Constitutional: Negative for fever and activity change.  HENT: Positive for congestion, rhinorrhea and sinus pressure. Negative for ear pain.   Eyes: Negative for discharge.  Respiratory: Positive for cough. Negative for shortness of breath and wheezing.   Cardiovascular: Negative for chest pain.  Neurological: Positive for headaches.       Objective:   Physical Exam  Nursing note and vitals reviewed. Constitutional: She appears well-developed.  HENT:  Head: Normocephalic.  Nose: Nose normal.  Mouth/Throat: Oropharynx is clear and moist. No oropharyngeal exudate.  Neck: Neck supple.  Cardiovascular: Normal rate and normal heart sounds.   No murmur heard. Pulmonary/Chest: Effort normal and breath sounds normal. She has no wheezes.  Lymphadenopathy:    She has no cervical adenopathy.  Skin: Skin is warm and dry.          Assessment & Plan:  Acute sinusitis started off as a virus secondary to sinusitis antibiotics prescribed followup if ongoing troubles warning signs discussed

## 2013-08-03 ENCOUNTER — Other Ambulatory Visit: Payer: Self-pay | Admitting: Family Medicine

## 2013-08-03 NOTE — Progress Notes (Signed)
I spoke with this patient at length about surgery. We saw her for a comprehensive checkup in November and a regular visit in December. There is no need to see her currently. She states she is doing well and no new changes. I informed her to stop all anti-inflammatory 7 days before her surgery I also told her if any significant change in her health she needs followup with [place] immediately also told her to follow the orthopedist advice specifically to avoid DVTs. She voiced understanding.

## 2013-09-11 ENCOUNTER — Telehealth: Payer: Self-pay | Admitting: Family Medicine

## 2013-09-11 MED ORDER — HYDROCODONE-ACETAMINOPHEN 5-325 MG PO TABS: ORAL_TABLET | ORAL | Status: DC

## 2013-09-11 NOTE — Telephone Encounter (Signed)
She may have a prescription for 35 tablets. One every 4 hours when necessary severe pain, she needs to followup office visit in order to get standard amount.

## 2013-09-11 NOTE — Telephone Encounter (Signed)
HYDROcodone-acetaminophen (NORCO/VICODIN) 5-325 MG per tablet   Last refilled: 05/28/13  Last Seen: 06/24/13

## 2013-09-12 NOTE — Telephone Encounter (Signed)
Husband notified that script is ready for pickup at front desk.

## 2013-09-26 ENCOUNTER — Other Ambulatory Visit: Payer: Self-pay | Admitting: Orthopedic Surgery

## 2013-10-09 ENCOUNTER — Encounter (HOSPITAL_COMMUNITY): Payer: Self-pay | Admitting: Pharmacy Technician

## 2013-10-11 ENCOUNTER — Telehealth: Payer: Self-pay | Admitting: Family Medicine

## 2013-10-11 NOTE — Telephone Encounter (Signed)
Patient last seen for check up 11/14- March phone message for hydrocodone stated patient needed office visit and cut her number of pills. Patient notified that she needed office visit. Patient stated she would call her ortho to get a rx of hydrocodone.

## 2013-10-11 NOTE — Telephone Encounter (Signed)
So noted 

## 2013-10-11 NOTE — Telephone Encounter (Signed)
Patient calling to request Rx for hydrocodone. °

## 2013-10-17 ENCOUNTER — Encounter (HOSPITAL_COMMUNITY): Payer: Self-pay

## 2013-10-17 ENCOUNTER — Encounter (HOSPITAL_COMMUNITY)
Admission: RE | Admit: 2013-10-17 | Discharge: 2013-10-17 | Disposition: A | Discharge status: Home / Self Care | Payer: PRIVATE HEALTH INSURANCE | Source: Ambulatory Visit | Attending: Orthopedic Surgery | Admitting: Orthopedic Surgery

## 2013-10-17 ENCOUNTER — Encounter (INDEPENDENT_AMBULATORY_CARE_PROVIDER_SITE_OTHER): Payer: Self-pay

## 2013-10-17 DIAGNOSIS — Z01812 Encounter for preprocedural laboratory examination: Secondary | ICD-10-CM | POA: Insufficient documentation

## 2013-10-17 HISTORY — DX: Reserved for concepts with insufficient information to code with codable children: IMO0002

## 2013-10-17 HISTORY — DX: Tinnitus, unspecified ear: H93.19

## 2013-10-17 HISTORY — DX: Leiomyoma of uterus, unspecified: D25.9

## 2013-10-17 HISTORY — DX: Unspecified hemorrhoids: K64.9

## 2013-10-17 HISTORY — DX: Other chest pain: R07.89

## 2013-10-17 HISTORY — DX: Hyperlipidemia, unspecified: E78.5

## 2013-10-17 HISTORY — DX: Unspecified osteoarthritis, unspecified site: M19.90

## 2013-10-17 HISTORY — DX: Personal history of other medical treatment: Z92.89

## 2013-10-17 HISTORY — DX: Asymptomatic varicose veins of unspecified lower extremity: I83.90

## 2013-10-17 LAB — URINE MICROSCOPIC-ADD ON

## 2013-10-17 LAB — URINALYSIS, ROUTINE W REFLEX MICROSCOPIC
Bilirubin Urine: NEGATIVE
Glucose, UA: NEGATIVE mg/dL
Hgb urine dipstick: NEGATIVE
KETONES UR: NEGATIVE mg/dL
Nitrite: NEGATIVE
Protein, ur: NEGATIVE mg/dL
Specific Gravity, Urine: 1.018 (ref 1.005–1.030)
Urobilinogen, UA: 0.2 mg/dL (ref 0.0–1.0)
pH: 5 (ref 5.0–8.0)

## 2013-10-17 LAB — CBC
HCT: 38.2 % (ref 36.0–46.0)
Hemoglobin: 12.5 g/dL (ref 12.0–15.0)
MCH: 28.2 pg (ref 26.0–34.0)
MCHC: 32.7 g/dL (ref 30.0–36.0)
MCV: 86 fL (ref 78.0–100.0)
PLATELETS: 245 10*3/uL (ref 150–400)
RBC: 4.44 MIL/uL (ref 3.87–5.11)
RDW: 13.8 % (ref 11.5–15.5)
WBC: 4.8 10*3/uL (ref 4.0–10.5)

## 2013-10-17 LAB — PROTIME-INR
INR: 0.95 (ref 0.00–1.49)
Prothrombin Time: 12.5 seconds (ref 11.6–15.2)

## 2013-10-17 LAB — COMPREHENSIVE METABOLIC PANEL
ALBUMIN: 3.9 g/dL (ref 3.5–5.2)
ALT: 17 U/L (ref 0–35)
AST: 21 U/L (ref 0–37)
Alkaline Phosphatase: 75 U/L (ref 39–117)
BUN: 14 mg/dL (ref 6–23)
CO2: 26 meq/L (ref 19–32)
CREATININE: 0.67 mg/dL (ref 0.50–1.10)
Calcium: 9.2 mg/dL (ref 8.4–10.5)
Chloride: 99 mEq/L (ref 96–112)
GFR calc Af Amer: >90 mL/min (ref >90)
Glucose, Bld: 76 mg/dL (ref 70–99)
Potassium: 3.9 mEq/L (ref 3.7–5.3)
Sodium: 137 mEq/L (ref 137–147)
Total Bilirubin: <0.2 mg/dL — ABNORMAL LOW (ref 0.3–1.2)
Total Protein: 7.1 g/dL (ref 6.0–8.3)

## 2013-10-17 LAB — SURGICAL PCR SCREEN
MRSA, PCR: NEGATIVE
STAPHYLOCOCCUS AUREUS: NEGATIVE

## 2013-10-17 LAB — APTT: aPTT: 33 seconds (ref 24–37)

## 2013-10-17 NOTE — Progress Notes (Signed)
10/17/13 1330  OBSTRUCTIVE SLEEP APNEA  Have you ever been diagnosed with sleep apnea through a sleep study? No  Do you snore loudly (loud enough to be heard through closed doors)?  1  Do you often feel tired, fatigued, or sleepy during the daytime? 0  Has anyone observed you stop breathing during your sleep? 0  Do you have, or are you being treated for high blood pressure? 1  BMI more than 35 kg/m2? 1  Age over 61 years old? 1  Neck circumference greater than 40 cm/18 inches? 0  Gender: 0  Obstructive Sleep Apnea Score 4  Score 4 or greater  Results sent to PCP

## 2013-10-17 NOTE — Patient Instructions (Signed)
   YOUR SURGERY IS SCHEDULED AT [facility]  ON:  Monday 4/20  REPORT TO  SHORT STAY CENTER AT:  6:30 am      PHONE # FOR SHORT STAY IS [phone]  DO NOT EAT OR DRINK ANYTHING AFTER MIDNIGHT THE NIGHT BEFORE YOUR SURGERY.  YOU MAY BRUSH YOUR TEETH, RINSE OUT YOUR MOUTH--BUT NO WATER, NO FOOD, NO CHEWING GUM, NO MINTS, NO CANDIES, NO CHEWING TOBACCO.  PLEASE TAKE THE FOLLOWING MEDICATIONS THE AM OF YOUR SURGERY WITH A FEW SIPS OF WATER:   [place].  TRAMADOL IF NEEDED   DO NOT BRING VALUABLES, MONEY, CREDIT CARDS.  DO NOT WEAR JEWELRY, MAKE-UP, NAIL POLISH AND NO METAL PINS OR CLIPS IN YOUR HAIR. CONTACT LENS, DENTURES / PARTIALS, GLASSES SHOULD NOT BE WORN TO SURGERY AND IN MOST CASES-HEARING AIDS WILL NEED TO BE REMOVED.  BRING YOUR GLASSES CASE, ANY EQUIPMENT NEEDED FOR YOUR CONTACT LENS. FOR PATIENTS ADMITTED TO THE HOSPITAL--CHECK OUT TIME THE DAY OF DISCHARGE IS 11:00 AM.  ALL INPATIENT ROOMS ARE PRIVATE - WITH BATHROOM, TELEPHONE, TELEVISION AND WIFI INTERNET.                                                     PLEASE READ OVER ANY  FACT SHEETS THAT YOU WERE GIVEN: MRSA INFORMATION, BLOOD TRANSFUSION INFORMATION, INCENTIVE SPIROMETER INFORMATION.  FAILURE TO FOLLOW THESE INSTRUCTIONS MAY RESULT IN THE CANCELLATION OF YOUR SURGERY. PLEASE BE AWARE THAT YOU MAY NEED ADDITIONAL BLOOD DRAWN DAY OF YOUR SURGERY  PATIENT SIGNATURE_________________________________

## 2013-10-17 NOTE — Pre-Procedure Instructions (Signed)
CT ANGIO CHEST REPORT AND EKG REPORT ARE IN EPIC FROM 11-16-12 NUCLEAR STRESS TEST REPORT IN EPIC FROM 11-17-12 MEDICAL CLEARANCE FOR KNEE REPLACEMENT SURGERY ON CHART FROM DR. Wolfgang Phoenix

## 2013-10-20 ENCOUNTER — Other Ambulatory Visit: Payer: Self-pay | Admitting: Orthopedic Surgery

## 2013-10-20 NOTE — H&P (Signed)
Susan Oneill DOB: July 03, 1951 Married / Language: English / Race: White Female  Date of Admission:  10-21-2013  Chief Complaint:  Right Knee Pain  History of Present Illness The patient is a 63 year old female who comes in for a preoperative History and Physical. The patient is scheduled for a right total knee arthroplasty to be performed by Dr. Dione Plover. Aluisio, MD at [facility] on 10-21-2013. The patient is a 63 year old female who presents for follow up of their knee. The patient is being followed for their bilateral knee pain and osteoarthritis. They are now 6 week(s) out from cortisone injections. Symptoms reported today include: pain. The patient feels that they are doing poorly and report their pain level to be mild to moderate. The patient has reported improvement of their symptoms with: Cortisone injections (the injections only helped for a short time). Note for "Follow-up Knee": She said she has to go up and down a platform all day at work and it is aggrevating her knees. Unfortunately, the right knee is getting progressively worse. Both knees bother her, but the right is worse than the left. She has had cortisone injections without benefit. She states that the knee is definitely limiting what she can and cannot do, and she cannot tolerate it anymore. She wants a more permanent solution for that right knee. She is ready to proceed with the knee surgery. They have been treated conservatively in the past for the above stated problem and despite conservative measures, they continue to have progressive pain and severe functional limitations and dysfunction. They have failed non-operative management including home exercise, medications, and injections. It is felt that they would benefit from undergoing total joint replacement. Risks and benefits of the procedure have been discussed with the patient and they elect to proceed with surgery. There are no active  contraindications to surgery such as ongoing infection or rapidly progressive neurological disease.   Allergies(Alexzandrew L Perkins, III PA-C; 10/03/2013 4:18 PM) Sulfanilamide *CHEMICALS*. Rash. Codeine Sulfate *ANALGESICS - OPIOID*. sickness, **She is able to take HYDROCODONE** Amoxicillin *PENICILLINS*. "years ago" - made her sick **Please note she has been able to take Penicillian**   Problem List Primary osteoarthritis of both knees (715.16)   Family History Cerebrovascular Accident. Father. Congestive Heart Failure. Father. Diabetes Mellitus. Mother. First Degree Relatives. reported Rheumatoid Arthritis. Brother. Osteoarthritis. Mother. Heart Disease. Father, Paternal Grandfather. Heart disease in female family member before age 45 Hypertension. Brother, Father, Mother, Paternal Jon Gills.    Social History Tobacco use. Never smoker. 06/04/2013 Tobacco / smoke exposure. 06/04/2013: no Marital status. married Never consumed alcohol. 06/04/2013: Never consumed alcohol Number of flights of stairs before winded. greater than 5 Current work status. working full time Children. 2 Living situation. live with spouse Exercise. Exercises rarely Post-Surgical Plans. Home    Medication History Hydrochlorothiazide (25MG  Tablet, Oral) Active. Lisinopril (20MG  Tablet, Oral) Active. NexIUM ( Oral) Specific dose unknown - Active. Naproxen (500MG  Tablet, Oral) Active. Hydrocodone-Acetaminophen (5-325MG  Tablet, Oral) Active. Ibuprofen PM (200-38MG  Tablet, Oral) Active. (on occasion at night) TraMADol HCl (50MG  Tablet, Oral) Active. Medications Reconciled.   Past Surgical History Tonsillectomy Arthroscopy of Knee. bilateral; Right Knee - Spet. 2011, Left Knee - Mar. 2010 Carpal Tunnel Repair. bilateral Hemorrhoidectomy Ventral Hernia Repair. Date: 94.  Past Medical History High blood pressure Gastroesophageal Reflux Disease Anxiety  Disorder Chronic Pain Tinnitus Varicose veins Hemorrhoids Transfusion history. History of Blood Transfusion - 1978 Menopause Measles Hyperlipidemia    Review of Systems General:Not Present-  Chills, Fever, Night Sweats, Fatigue, Weight Gain, Weight Loss and Memory Loss. Skin:Not Present- Hives, Itching, Rash, Eczema and Lesions. HEENT:Present- Hearing Loss. Not Present- Tinnitus, Headache, Double Vision, Visual Loss and Dentures. Respiratory:Not Present- Shortness of breath with exertion, Shortness of breath at rest, Allergies, Coughing up blood and Chronic Cough. Cardiovascular:Not Present- Chest Pain, Racing/skipping heartbeats, Difficulty Breathing Lying Down, Murmur, Swelling and Palpitations. Gastrointestinal:Present- Heartburn. Not Present- Bloody Stool, Abdominal Pain, Vomiting, Nausea, Constipation, Diarrhea, Difficulty Swallowing, Jaundice and Loss of appetitie. Female Genitourinary:Not Present- Blood in Urine, Urinary frequency, Weak urinary stream, Discharge, Flank Pain, Incontinence, Painful Urination, Urgency, Urinary Retention and Urinating at Night. Musculoskeletal:Present- Joint Pain. Not Present- Muscle Weakness, Muscle Pain, Joint Swelling, Back Pain, Morning Stiffness and Spasms. Neurological:Not Present- Tremor, Dizziness, Blackout spells, Paralysis, Difficulty with balance and Weakness. Psychiatric:Not Present- Insomnia.    Vitals Weight: 120 lb Height: 65 in Weight was reported by patient. Height was reported by patient. Body Surface Area: 1.58 m Body Mass Index: 19.97 kg/m Pulse: 64 (Regular) Resp.: 14 (Unlabored) BP: 144/82 (Sitting, Left Arm, Standard)     Physical Exam The physical exam findings are as follows:   General Mental Status - Alert, cooperative and good historian. General Appearance- pleasant. Not in acute distress. Orientation- Oriented X3. Build & Nutrition- Well nourished and Well  developed.   Head and Neck Head- normocephalic, atraumatic . Neck Global Assessment- supple. no bruit auscultated on the right and no bruit auscultated on the left.   Eye Vision- Wears corrective lenses. Pupil- Bilateral- Regular and Round. Motion- Bilateral- EOMI.   Chest and Lung Exam Auscultation: Breath sounds:- clear at anterior chest wall and - clear at posterior chest wall. Adventitious sounds:- No Adventitious sounds.   Cardiovascular Auscultation:Rhythm- Regular rate and rhythm. Heart Sounds- S1 WNL and S2 WNL. Murmurs & Other Heart Sounds:Auscultation of the heart reveals - No Murmurs.   Abdomen Palpation/Percussion:Tenderness- Abdomen is non-tender to palpation. Rigidity (guarding)- Abdomen is soft. Auscultation:Auscultation of the abdomen reveals - Bowel sounds normal.   Female Genitourinary   Note: Not done, not pertinent to present illness  Musculoskeletal   Note: On exam she is alert and oriented in no apparent distress. Her hips with normal motion and no discomfort. Right knee no effusion. There is marked crepitus on range of motion of the knee. She is tender lateral and medial with no instability.  RADIOGRAPHS Previous x-rays, she has bone on bone arthritis medial and patellofemoral.   Assessment & Plan Primary osteoarthritis of both knees (715.16)  Note: Plan is for a Right Total Knee Replacement by Dr. Wynelle Link.  Plan is to go home.  PCP - Dr. Sallee Lange - Patient has been seen preoperatively and felt to be stable for surgery.  The patient does not have any contraindications and will receive TXA (tranexamic acid) prior to surgery.  Signed electronically by Joelene Millin, III PA-C

## 2013-10-21 ENCOUNTER — Inpatient Hospital Stay (HOSPITAL_COMMUNITY)
Admission: RE | Admit: 2013-10-21 | Discharge: 2013-10-23 | DRG: 470 | Disposition: A | Discharge status: Home Health | Payer: PRIVATE HEALTH INSURANCE | Source: Ambulatory Visit | Attending: Orthopedic Surgery | Admitting: Orthopedic Surgery

## 2013-10-21 ENCOUNTER — Encounter (HOSPITAL_COMMUNITY): Payer: Self-pay | Admitting: *Deleted

## 2013-10-21 ENCOUNTER — Encounter (HOSPITAL_COMMUNITY)
Admission: RE | Disposition: A | Discharge status: Home Health | Payer: Self-pay | Source: Ambulatory Visit | Attending: Orthopedic Surgery

## 2013-10-21 ENCOUNTER — Inpatient Hospital Stay (HOSPITAL_COMMUNITY): Payer: PRIVATE HEALTH INSURANCE | Admitting: Anesthesiology

## 2013-10-21 ENCOUNTER — Encounter (HOSPITAL_COMMUNITY): Payer: PRIVATE HEALTH INSURANCE | Admitting: Anesthesiology

## 2013-10-21 DIAGNOSIS — M179 Osteoarthritis of knee, unspecified: Secondary | ICD-10-CM | POA: Diagnosis present

## 2013-10-21 DIAGNOSIS — M898X9 Other specified disorders of bone, unspecified site: Secondary | ICD-10-CM | POA: Diagnosis present

## 2013-10-21 DIAGNOSIS — I10 Essential (primary) hypertension: Secondary | ICD-10-CM | POA: Diagnosis present

## 2013-10-21 DIAGNOSIS — Z79899 Other long term (current) drug therapy: Secondary | ICD-10-CM

## 2013-10-21 DIAGNOSIS — Z6835 Body mass index (BMI) 35.0-35.9, adult: Secondary | ICD-10-CM

## 2013-10-21 DIAGNOSIS — Z96651 Presence of right artificial knee joint: Secondary | ICD-10-CM

## 2013-10-21 DIAGNOSIS — F411 Generalized anxiety disorder: Secondary | ICD-10-CM | POA: Diagnosis present

## 2013-10-21 DIAGNOSIS — E669 Obesity, unspecified: Secondary | ICD-10-CM | POA: Diagnosis present

## 2013-10-21 DIAGNOSIS — M1710 Unilateral primary osteoarthritis, unspecified knee: Principal | ICD-10-CM | POA: Diagnosis present

## 2013-10-21 DIAGNOSIS — H9319 Tinnitus, unspecified ear: Secondary | ICD-10-CM | POA: Diagnosis present

## 2013-10-21 DIAGNOSIS — G8929 Other chronic pain: Secondary | ICD-10-CM | POA: Diagnosis present

## 2013-10-21 DIAGNOSIS — Z01812 Encounter for preprocedural laboratory examination: Secondary | ICD-10-CM

## 2013-10-21 DIAGNOSIS — E785 Hyperlipidemia, unspecified: Secondary | ICD-10-CM | POA: Diagnosis present

## 2013-10-21 DIAGNOSIS — D62 Acute posthemorrhagic anemia: Secondary | ICD-10-CM | POA: Diagnosis not present

## 2013-10-21 DIAGNOSIS — K219 Gastro-esophageal reflux disease without esophagitis: Secondary | ICD-10-CM | POA: Diagnosis present

## 2013-10-21 HISTORY — PX: TOTAL KNEE ARTHROPLASTY: SHX125

## 2013-10-21 LAB — ABO/RH: ABO/RH(D): A POS

## 2013-10-21 LAB — TYPE AND SCREEN
ABO/RH(D): A POS
Antibody Screen: NEGATIVE

## 2013-10-21 SURGERY — ARTHROPLASTY, KNEE, TOTAL
Anesthesia: Spinal | Site: Knee | Laterality: Right

## 2013-10-21 MED ORDER — CHLORHEXIDINE GLUCONATE 4 % EX LIQD: 60.0000 mL | Freq: Once | CUTANEOUS | Status: DC

## 2013-10-21 MED ORDER — HYDROMORPHONE HCL PF 1 MG/ML IJ SOLN
0.2500 mg | INTRAMUSCULAR | Status: DC | PRN
  Administered 2013-10-21: 0.5 mg via INTRAVENOUS
  Administered 2013-10-21: 0.25 mg via INTRAVENOUS
  Administered 2013-10-21: 0.5 mg via INTRAVENOUS
  Administered 2013-10-21 (×3): 0.25 mg via INTRAVENOUS

## 2013-10-21 MED ORDER — PROPOFOL 10 MG/ML IV BOLUS
INTRAVENOUS | Status: AC
  Filled 2013-10-21: qty 20

## 2013-10-21 MED ORDER — HYDROMORPHONE HCL PF 1 MG/ML IJ SOLN
INTRAMUSCULAR | Status: AC
  Filled 2013-10-21: qty 1

## 2013-10-21 MED ORDER — ACETAMINOPHEN 500 MG PO TABS
1000.0000 mg | ORAL_TABLET | Freq: Four times a day (QID) | ORAL | Status: AC
  Administered 2013-10-21 – 2013-10-22 (×4): 1000 mg via ORAL
  Filled 2013-10-21 (×4): qty 2

## 2013-10-21 MED ORDER — METHOCARBAMOL 500 MG PO TABS
500.0000 mg | ORAL_TABLET | Freq: Four times a day (QID) | ORAL | Status: DC | PRN
  Administered 2013-10-21 – 2013-10-23 (×6): 500 mg via ORAL
  Filled 2013-10-21 (×6): qty 1

## 2013-10-21 MED ORDER — BUPIVACAINE HCL 0.25 % IJ SOLN
INTRAMUSCULAR | Status: DC | PRN
  Administered 2013-10-21: 20 mL

## 2013-10-21 MED ORDER — MIDAZOLAM HCL 5 MG/5ML IJ SOLN
INTRAMUSCULAR | Status: DC | PRN
  Administered 2013-10-21: 2 mg via INTRAVENOUS

## 2013-10-21 MED ORDER — TRANEXAMIC ACID 100 MG/ML IV SOLN
1000.0000 mg | INTRAVENOUS | Status: AC
  Administered 2013-10-21: 1000 mg via INTRAVENOUS
  Filled 2013-10-21: qty 10

## 2013-10-21 MED ORDER — SODIUM CHLORIDE 0.9 % IR SOLN
Status: DC | PRN
  Administered 2013-10-21: 1

## 2013-10-21 MED ORDER — PHENYLEPHRINE 40 MCG/ML (10ML) SYRINGE FOR IV PUSH (FOR BLOOD PRESSURE SUPPORT)
PREFILLED_SYRINGE | INTRAVENOUS | Status: AC
  Filled 2013-10-21: qty 10

## 2013-10-21 MED ORDER — DEXAMETHASONE 6 MG PO TABS
10.0000 mg | ORAL_TABLET | Freq: Every day | ORAL | Status: AC
  Administered 2013-10-22: 10 mg via ORAL
  Filled 2013-10-21: qty 1

## 2013-10-21 MED ORDER — METHOCARBAMOL 100 MG/ML IJ SOLN
500.0000 mg | Freq: Four times a day (QID) | INTRAVENOUS | Status: DC | PRN
  Administered 2013-10-21: 500 mg via INTRAVENOUS
  Filled 2013-10-21: qty 5

## 2013-10-21 MED ORDER — MORPHINE SULFATE 2 MG/ML IJ SOLN
1.0000 mg | INTRAMUSCULAR | Status: DC | PRN
  Administered 2013-10-21 – 2013-10-22 (×5): 2 mg via INTRAVENOUS
  Administered 2013-10-22: 1 mg via INTRAVENOUS
  Filled 2013-10-21 (×5): qty 1

## 2013-10-21 MED ORDER — FENTANYL CITRATE 0.05 MG/ML IJ SOLN
INTRAMUSCULAR | Status: DC | PRN
  Administered 2013-10-21: 100 ug via INTRAVENOUS

## 2013-10-21 MED ORDER — ACETAMINOPHEN 10 MG/ML IV SOLN
1000.0000 mg | Freq: Once | INTRAVENOUS | Status: AC
  Administered 2013-10-21: 1000 mg via INTRAVENOUS
  Filled 2013-10-21: qty 100

## 2013-10-21 MED ORDER — METOCLOPRAMIDE HCL 10 MG PO TABS: 5.0000 mg | ORAL_TABLET | Freq: Three times a day (TID) | ORAL | Status: DC | PRN

## 2013-10-21 MED ORDER — RIVAROXABAN 10 MG PO TABS
10.0000 mg | ORAL_TABLET | Freq: Every day | ORAL | Status: DC
  Administered 2013-10-22 – 2013-10-23 (×2): 10 mg via ORAL
  Filled 2013-10-21 (×3): qty 1

## 2013-10-21 MED ORDER — DIPHENHYDRAMINE HCL 12.5 MG/5ML PO ELIX: 12.5000 mg | ORAL_SOLUTION | ORAL | Status: DC | PRN

## 2013-10-21 MED ORDER — ACETAMINOPHEN 325 MG PO TABS: 650.0000 mg | ORAL_TABLET | Freq: Four times a day (QID) | ORAL | Status: DC | PRN

## 2013-10-21 MED ORDER — SODIUM CHLORIDE 0.9 % IJ SOLN
INTRAMUSCULAR | Status: DC | PRN
  Administered 2013-10-21: 30 mL

## 2013-10-21 MED ORDER — ONDANSETRON HCL 4 MG PO TABS: 4.0000 mg | ORAL_TABLET | Freq: Four times a day (QID) | ORAL | Status: DC | PRN

## 2013-10-21 MED ORDER — PHENOL 1.4 % MT LIQD: 1.0000 | OROMUCOSAL | Status: DC | PRN

## 2013-10-21 MED ORDER — BUPIVACAINE HCL (PF) 0.25 % IJ SOLN
INTRAMUSCULAR | Status: AC
  Filled 2013-10-21: qty 30

## 2013-10-21 MED ORDER — BUPIVACAINE LIPOSOME 1.3 % IJ SUSP
INTRAMUSCULAR | Status: DC | PRN
  Administered 2013-10-21: 20 mL

## 2013-10-21 MED ORDER — VANCOMYCIN HCL IN DEXTROSE 1-5 GM/200ML-% IV SOLN
INTRAVENOUS | Status: AC
  Filled 2013-10-21: qty 200

## 2013-10-21 MED ORDER — DEXAMETHASONE SODIUM PHOSPHATE 10 MG/ML IJ SOLN
10.0000 mg | Freq: Every day | INTRAMUSCULAR | Status: AC
  Filled 2013-10-21: qty 1

## 2013-10-21 MED ORDER — KETAMINE HCL 10 MG/ML IJ SOLN
INTRAMUSCULAR | Status: DC | PRN
  Administered 2013-10-21: 20 mg via INTRAVENOUS

## 2013-10-21 MED ORDER — VANCOMYCIN HCL IN DEXTROSE 1-5 GM/200ML-% IV SOLN
1000.0000 mg | Freq: Two times a day (BID) | INTRAVENOUS | Status: AC
  Administered 2013-10-21: 1000 mg via INTRAVENOUS
  Filled 2013-10-21: qty 200

## 2013-10-21 MED ORDER — BUPIVACAINE HCL (PF) 0.75 % IJ SOLN
INTRAMUSCULAR | Status: DC | PRN
  Administered 2013-10-21: 15 mg

## 2013-10-21 MED ORDER — ONDANSETRON HCL 4 MG/2ML IJ SOLN: 4.0000 mg | Freq: Four times a day (QID) | INTRAMUSCULAR | Status: DC | PRN

## 2013-10-21 MED ORDER — DOCUSATE SODIUM 100 MG PO CAPS
100.0000 mg | ORAL_CAPSULE | Freq: Two times a day (BID) | ORAL | Status: DC
  Administered 2013-10-21 – 2013-10-23 (×4): 100 mg via ORAL

## 2013-10-21 MED ORDER — PROMETHAZINE HCL 25 MG/ML IJ SOLN: 6.2500 mg | INTRAMUSCULAR | Status: DC | PRN

## 2013-10-21 MED ORDER — BUPIVACAINE LIPOSOME 1.3 % IJ SUSP
20.0000 mL | Freq: Once | INTRAMUSCULAR | Status: DC
  Filled 2013-10-21: qty 20

## 2013-10-21 MED ORDER — KCL IN DEXTROSE-NACL 20-5-0.9 MEQ/L-%-% IV SOLN
INTRAVENOUS | Status: DC
  Administered 2013-10-21: 20:00:00 via INTRAVENOUS
  Filled 2013-10-21 (×3): qty 1000

## 2013-10-21 MED ORDER — MIDAZOLAM HCL 2 MG/2ML IJ SOLN
INTRAMUSCULAR | Status: AC
  Filled 2013-10-21: qty 2

## 2013-10-21 MED ORDER — POLYETHYLENE GLYCOL 3350 17 G PO PACK: 17.0000 g | PACK | Freq: Every day | ORAL | Status: DC | PRN

## 2013-10-21 MED ORDER — ACETAMINOPHEN 650 MG RE SUPP: 650.0000 mg | Freq: Four times a day (QID) | RECTAL | Status: DC | PRN

## 2013-10-21 MED ORDER — SODIUM CHLORIDE 0.9 % IJ SOLN
INTRAMUSCULAR | Status: AC
  Filled 2013-10-21: qty 50

## 2013-10-21 MED ORDER — PROPOFOL INFUSION 10 MG/ML OPTIME
INTRAVENOUS | Status: DC | PRN
Start: 2013-10-21 — End: 2013-10-21
  Administered 2013-10-21: 70 ug/kg/min via INTRAVENOUS

## 2013-10-21 MED ORDER — MORPHINE SULFATE 2 MG/ML IJ SOLN
INTRAMUSCULAR | Status: AC
  Filled 2013-10-21: qty 1

## 2013-10-21 MED ORDER — TRAMADOL HCL 50 MG PO TABS
50.0000 mg | ORAL_TABLET | Freq: Four times a day (QID) | ORAL | Status: DC | PRN
  Administered 2013-10-21 – 2013-10-23 (×3): 100 mg via ORAL
  Filled 2013-10-21 (×3): qty 2

## 2013-10-21 MED ORDER — MENTHOL 3 MG MT LOZG: 1.0000 | LOZENGE | OROMUCOSAL | Status: DC | PRN

## 2013-10-21 MED ORDER — DEXAMETHASONE SODIUM PHOSPHATE 10 MG/ML IJ SOLN
10.0000 mg | Freq: Once | INTRAMUSCULAR | Status: AC
  Administered 2013-10-21: 10 mg via INTRAVENOUS

## 2013-10-21 MED ORDER — FLEET ENEMA 7-19 GM/118ML RE ENEM: 1.0000 | ENEMA | Freq: Once | RECTAL | Status: AC | PRN

## 2013-10-21 MED ORDER — FENTANYL CITRATE 0.05 MG/ML IJ SOLN
INTRAMUSCULAR | Status: AC
  Filled 2013-10-21: qty 2

## 2013-10-21 MED ORDER — SODIUM CHLORIDE 0.9 % IV SOLN: INTRAVENOUS | Status: DC

## 2013-10-21 MED ORDER — NITROGLYCERIN 0.4 MG SL SUBL
0.4000 mg | SUBLINGUAL_TABLET | SUBLINGUAL | Status: DC | PRN
Start: 2013-10-21 — End: 2013-10-23

## 2013-10-21 MED ORDER — HYDROCHLOROTHIAZIDE 25 MG PO TABS
25.0000 mg | ORAL_TABLET | Freq: Every morning | ORAL | Status: DC
  Administered 2013-10-22 – 2013-10-23 (×2): 25 mg via ORAL
  Filled 2013-10-21 (×3): qty 1

## 2013-10-21 MED ORDER — LACTATED RINGERS IV SOLN
INTRAVENOUS | Status: DC | PRN
  Administered 2013-10-21 (×3): via INTRAVENOUS

## 2013-10-21 MED ORDER — PHENYLEPHRINE HCL 10 MG/ML IJ SOLN
INTRAMUSCULAR | Status: DC | PRN
  Administered 2013-10-21: 80 ug via INTRAVENOUS
  Administered 2013-10-21 (×3): 40 ug via INTRAVENOUS

## 2013-10-21 MED ORDER — BISACODYL 10 MG RE SUPP: 10.0000 mg | Freq: Every day | RECTAL | Status: DC | PRN

## 2013-10-21 MED ORDER — PANTOPRAZOLE SODIUM 40 MG PO TBEC: 40.0000 mg | DELAYED_RELEASE_TABLET | Freq: Every day | ORAL | Status: DC

## 2013-10-21 MED ORDER — OXYCODONE HCL 5 MG PO TABS
5.0000 mg | ORAL_TABLET | ORAL | Status: DC | PRN
  Administered 2013-10-21: 10 mg via ORAL
  Administered 2013-10-21 (×2): 5 mg via ORAL
  Administered 2013-10-21 – 2013-10-23 (×11): 10 mg via ORAL
  Filled 2013-10-21 (×6): qty 2
  Filled 2013-10-21: qty 1
  Filled 2013-10-21 (×5): qty 2
  Filled 2013-10-21: qty 1
  Filled 2013-10-21: qty 2

## 2013-10-21 MED ORDER — METOCLOPRAMIDE HCL 5 MG/ML IJ SOLN: 5.0000 mg | Freq: Three times a day (TID) | INTRAMUSCULAR | Status: DC | PRN

## 2013-10-21 MED ORDER — VANCOMYCIN HCL IN DEXTROSE 1-5 GM/200ML-% IV SOLN
1000.0000 mg | INTRAVENOUS | Status: AC
  Administered 2013-10-21: 1000 mg via INTRAVENOUS

## 2013-10-21 SURGICAL SUPPLY — 59 items
BAG SPEC THK2 15X12 ZIP CLS (MISCELLANEOUS) ×1
BAG ZIPLOCK 12X15 (MISCELLANEOUS) ×2 IMPLANT
BANDAGE ELASTIC 6 VELCRO ST LF (GAUZE/BANDAGES/DRESSINGS) ×2 IMPLANT
BANDAGE ESMARK 6X9 LF (GAUZE/BANDAGES/DRESSINGS) ×1 IMPLANT
BLADE SAG 18X100X1.27 (BLADE) ×2 IMPLANT
BLADE SAW SGTL 11.0X1.19X90.0M (BLADE) ×2 IMPLANT
BNDG CMPR 9X6 STRL LF SNTH (GAUZE/BANDAGES/DRESSINGS) ×1
BNDG ESMARK 6X9 LF (GAUZE/BANDAGES/DRESSINGS) ×2
BOWL SMART MIX CTS (DISPOSABLE) ×2 IMPLANT
CAP KNEE ATTUNE RP ×1 IMPLANT
CEMENT HV SMART SET (Cement) ×4 IMPLANT
CUFF TOURN SGL QUICK 34 (TOURNIQUET CUFF) ×2
CUFF TRNQT CYL 34X4X40X1 (TOURNIQUET CUFF) ×1 IMPLANT
DECANTER SPIKE VIAL GLASS SM (MISCELLANEOUS) ×2 IMPLANT
DRAPE EXTREMITY T 121X128X90 (DRAPE) ×2 IMPLANT
DRAPE POUCH INSTRU U-SHP 10X18 (DRAPES) ×2 IMPLANT
DRAPE U-SHAPE 47X51 STRL (DRAPES) ×2 IMPLANT
DRSG ADAPTIC 3X8 NADH LF (GAUZE/BANDAGES/DRESSINGS) ×2 IMPLANT
DRSG PAD ABDOMINAL 8X10 ST (GAUZE/BANDAGES/DRESSINGS) ×2 IMPLANT
DURAPREP 26ML APPLICATOR (WOUND CARE) ×2 IMPLANT
ELECT REM PT RETURN 9FT ADLT (ELECTROSURGICAL) ×2
ELECTRODE REM PT RTRN 9FT ADLT (ELECTROSURGICAL) ×1 IMPLANT
EVACUATOR 1/8 PVC DRAIN (DRAIN) ×2 IMPLANT
FACESHIELD WRAPAROUND (MASK) ×8 IMPLANT
FACESHIELD WRAPAROUND OR TEAM (MASK) ×5 IMPLANT
GLOVE BIO SURGEON STRL SZ7.5 (GLOVE) IMPLANT
GLOVE BIO SURGEON STRL SZ8 (GLOVE) ×2 IMPLANT
GLOVE BIOGEL PI IND STRL 8 (GLOVE) ×2 IMPLANT
GLOVE BIOGEL PI INDICATOR 8 (GLOVE) ×2
GLOVE SURG SS PI 6.5 STRL IVOR (GLOVE) IMPLANT
GOWN STRL REUS W/TWL LRG LVL3 (GOWN DISPOSABLE) ×2 IMPLANT
GOWN STRL REUS W/TWL XL LVL3 (GOWN DISPOSABLE) IMPLANT
HANDPIECE INTERPULSE COAX TIP (DISPOSABLE) ×2
IMMOBILIZER KNEE 20 (SOFTGOODS) ×2 IMPLANT
KIT BASIN OR (CUSTOM PROCEDURE TRAY) ×2 IMPLANT
MANIFOLD NEPTUNE II (INSTRUMENTS) ×2 IMPLANT
NDL SAFETY ECLIPSE 18X1.5 (NEEDLE) ×2 IMPLANT
NEEDLE HYPO 18GX1.5 SHARP (NEEDLE) ×4
NS IRRIG 1000ML POUR BTL (IV SOLUTION) ×2 IMPLANT
PACK TOTAL JOINT (CUSTOM PROCEDURE TRAY) ×2 IMPLANT
PAD ABD 8X10 STRL (GAUZE/BANDAGES/DRESSINGS) ×1 IMPLANT
PADDING CAST COTTON 6X4 STRL (CAST SUPPLIES) ×4 IMPLANT
PENCIL BUTTON HOLSTER BLD 10FT (ELECTRODE) ×1 IMPLANT
POSITIONER SURGICAL ARM (MISCELLANEOUS) ×2 IMPLANT
SET HNDPC FAN SPRY TIP SCT (DISPOSABLE) ×1 IMPLANT
SPONGE GAUZE 4X4 12PLY (GAUZE/BANDAGES/DRESSINGS) ×2 IMPLANT
STRIP CLOSURE SKIN 1/2X4 (GAUZE/BANDAGES/DRESSINGS) ×3 IMPLANT
SUCTION FRAZIER 12FR DISP (SUCTIONS) ×2 IMPLANT
SUT MNCRL AB 4-0 PS2 18 (SUTURE) ×2 IMPLANT
SUT VIC AB 2-0 CT1 27 (SUTURE) ×6
SUT VIC AB 2-0 CT1 TAPERPNT 27 (SUTURE) ×3 IMPLANT
SUT VLOC 180 0 24IN GS25 (SUTURE) ×2 IMPLANT
SYR 20CC LL (SYRINGE) ×2 IMPLANT
SYR 50ML LL SCALE MARK (SYRINGE) ×2 IMPLANT
TOWEL OR 17X26 10 PK STRL BLUE (TOWEL DISPOSABLE) ×2 IMPLANT
TOWEL OR NON WOVEN STRL DISP B (DISPOSABLE) IMPLANT
TRAY FOLEY CATH 14FRSI W/METER (CATHETERS) ×2 IMPLANT
WATER STERILE IRR 1500ML POUR (IV SOLUTION) ×2 IMPLANT
WRAP KNEE MAXI GEL POST OP (GAUZE/BANDAGES/DRESSINGS) ×2 IMPLANT

## 2013-10-21 NOTE — Evaluation (Signed)
Physical Therapy Evaluation Patient Details Name: Susan Oneill MRN: [id number] DOB: [birth year] Today's Date: 10/21/2013   History of Present Illness  s/p R TKA; PMHx: HTN,  bil CTR  Clinical Impression  Pt will benefit from PT to address deficits below; plan is for HHPT    Follow Up Recommendations Home health PT    Equipment Recommendations       Recommendations for Other Services       Precautions / Restrictions Precautions Precautions: Knee Precaution Comments: cyst L wrist for which she wears splint at night, pt to bring and wear if WBing exacerbates discomfort Required Braces or Orthoses: Knee Immobilizer - Right Knee Immobilizer - Right: Discontinue once straight leg raise with < 10 degree lag Restrictions Other Position/Activity Restrictions: WBAT      Mobility  Bed Mobility Overal bed mobility: Needs Assistance Bed Mobility: Supine to Sit     Supine to sit: Mod assist     General bed mobility comments: assist with UB and RLE, incr time  Transfers Overall transfer level: Needs assistance Equipment used: Rolling walker (2 wheeled) Transfers: Sit to/from Stand Sit to Stand: Min assist         General transfer comment: cues for hand placement and RLE position  Ambulation/Gait Ambulation/Gait assistance: Min assist Ambulation Distance (Feet): 45 Feet Assistive device: Rolling walker (2 wheeled) Gait Pattern/deviations: Step-to pattern;Antalgic     General Gait Details: cues for sequence  Stairs            Wheelchair Mobility    Modified Rankin (Stroke Patients Only)       Balance                                             Pertinent Vitals/Pain No c/o during PT, pt requested meds after session Ice to knee, reported feeling better with amb    Home Living Family/patient expects to be discharged to:: Private residence Living Arrangements: Spouse/significant other Available Help at Discharge: Family Type of  Home: House Home Access: Stairs to enter   [employer] of Steps: 1 and 1 or 2?? Home Layout: One level Home Equipment: Walker - 2 wheels;Cane - single point      Prior Function Level of Independence: Independent               Hand Dominance        Extremity/Trunk Assessment   Upper Extremity Assessment: Defer to OT evaluation           Lower Extremity Assessment: RLE deficits/detail RLE Deficits / Details: ankle grossly WFL;  assists minimally with SLR;        Communication   Communication: No difficulties  Cognition Arousal/Alertness: Awake/alert Behavior During Therapy: WFL for tasks assessed/performed Overall Cognitive Status: Within Functional Limits for tasks assessed                      General Comments      Exercises Total Joint Exercises Ankle Circles/Pumps: AROM;Both;10 reps      Assessment/Plan    PT Assessment Patient needs continued PT services  PT Diagnosis Difficulty walking   PT Problem List Decreased activity tolerance;Decreased mobility;Decreased range of motion;Decreased knowledge of use of DME  PT Treatment Interventions DME instruction;Gait training;Stair training;Functional mobility training;Therapeutic activities;Therapeutic exercise;Patient/family education   PT Goals (Current goals can be found in the Care Plan  section) Acute Rehab PT Goals Patient Stated Goal: I  PT Goal Formulation: With patient Time For Goal Achievement: 10/25/13 Potential to Achieve Goals: Good    Frequency 7X/week   Barriers to discharge        Co-evaluation               End of Session                 Time: 6286-3817 PT Time Calculation (min): 20 min   Charges:   PT Evaluation $Initial PT Evaluation Tier I: 1 Procedure PT Treatments $Gait Training: 8-22 mins   PT G Codes:          Neil Crouch 10/21/2013, 5:04 PM

## 2013-10-21 NOTE — Anesthesia Preprocedure Evaluation (Addendum)
Anesthesia Evaluation  Patient identified by MRN, date of birth, ID band Patient awake    Reviewed: Allergy & Precautions, H&P , NPO status , Patient's Chart, lab work & pertinent test results  Airway Mallampati: II TM Distance: >3 FB Neck ROM: Full    Dental no notable dental hx.    Pulmonary neg pulmonary ROS,  breath sounds clear to auscultation  Pulmonary exam normal       Cardiovascular Exercise Tolerance: Good hypertension, Pt. on medications + Peripheral Vascular Disease Rhythm:Regular Rate:Normal     Neuro/Psych negative neurological ROS  negative psych ROS   GI/Hepatic Neg liver ROS, GERD-  Medicated,  Endo/Other  negative endocrine ROS  Renal/GU negative Renal ROS  negative genitourinary   Musculoskeletal negative musculoskeletal ROS (+)   Abdominal (+) + obese,   Peds negative pediatric ROS (+)  Hematology negative hematology ROS (+)   Anesthesia Other Findings   Reproductive/Obstetrics negative OB ROS                          Anesthesia Physical Anesthesia Plan  ASA: II  Anesthesia Plan: Spinal   Post-op Pain Management:    Induction: Intravenous  Airway Management Planned:   Additional Equipment:   Intra-op Plan:   Post-operative Plan: Extubation in OR  Informed Consent: I have reviewed the patients History and Physical, chart, labs and discussed the procedure including the risks, benefits and alternatives for the proposed anesthesia with the patient or authorized representative who has indicated his/her understanding and acceptance.   Dental advisory given  Plan Discussed with: CRNA  Anesthesia Plan Comments: (Discussed general versus spinal. Discussed risks/benefits of spinal including headache, backache, failure, bleeding, infection, and nerve damage. Patient consents to spinal. Questions answered. Coagulation studies and platelet count acceptable.)        Anesthesia Quick Evaluation

## 2013-10-21 NOTE — H&P (View-Only) (Signed)
Susan Oneill DOB: [birth year] Married / Language: English / Race: White Female  Date of Admission:  10-21-2013  Chief Complaint:  Right Knee Pain  History of Present Illness The patient is a 63 year old female who comes in for a preoperative History and Physical. The patient is scheduled for a right total knee arthroplasty to be performed by Dr. Dione Plover. Aluisio, MD at [facility] on 10-21-2013. The patient is a 63 year old female who presents for follow up of their knee. The patient is being followed for their bilateral knee pain and osteoarthritis. They are now 6 week(s) out from cortisone injections. Symptoms reported today include: pain. The patient feels that they are doing poorly and report their pain level to be mild to moderate. The patient has reported improvement of their symptoms with: Cortisone injections (the injections only helped for a short time). Note for "Follow-up Knee": She said she has to go up and down a platform all day at work and it is aggrevating her knees. Unfortunately, the right knee is getting progressively worse. Both knees bother her, but the right is worse than the left. She has had cortisone injections without benefit. She states that the knee is definitely limiting what she can and cannot do, and she cannot tolerate it anymore. She wants a more permanent solution for that right knee. She is ready to proceed with the knee surgery. They have been treated conservatively in the past for the above stated problem and despite conservative measures, they continue to have progressive pain and severe functional limitations and dysfunction. They have failed non-operative management including home exercise, medications, and injections. It is felt that they would benefit from undergoing total joint replacement. Risks and benefits of the procedure have been discussed with the patient and they elect to proceed with surgery. There are no active  contraindications to surgery such as ongoing infection or rapidly progressive neurological disease.   Allergies(Indianna Boran L Taylon Coole, III PA-C; 10/03/2013 4:18 PM) Sulfanilamide *CHEMICALS*. Rash. Codeine Sulfate *ANALGESICS - OPIOID*. sickness, **She is able to take HYDROCODONE** Amoxicillin *PENICILLINS*. "years ago" - made her sick **Please note she has been able to take Penicillian**   Problem List Primary osteoarthritis of both knees (715.16)   Family History Cerebrovascular Accident. Father. Congestive Heart Failure. Father. Diabetes Mellitus. Mother. First Degree Relatives. reported Rheumatoid Arthritis. Brother. Osteoarthritis. Mother. Heart Disease. Father, Paternal Grandfather. Heart disease in female family member before age 40 Hypertension. Brother, Father, Mother, Paternal Jon Gills.    Social History Tobacco use. Never smoker. 06/04/2013 Tobacco / smoke exposure. 06/04/2013: no Marital status. married Never consumed alcohol. 06/04/2013: Never consumed alcohol Number of flights of stairs before winded. greater than 5 Current work status. working full time Children. 2 Living situation. live with spouse Exercise. Exercises rarely Post-Surgical Plans. Home    Medication History Hydrochlorothiazide (25MG  Tablet, Oral) Active. Lisinopril (20MG  Tablet, Oral) Active. NexIUM ( Oral) Specific dose unknown - Active. Naproxen (500MG  Tablet, Oral) Active. Hydrocodone-Acetaminophen (5-325MG  Tablet, Oral) Active. Ibuprofen PM (200-38MG  Tablet, Oral) Active. (on occasion at night) TraMADol HCl (50MG  Tablet, Oral) Active. Medications Reconciled.   Past Surgical History Tonsillectomy Arthroscopy of Knee. bilateral; Right Knee - Spet. 2011, Left Knee - Mar. 2010 Carpal Tunnel Repair. bilateral Hemorrhoidectomy Ventral Hernia Repair. Date: 13.  Past Medical History High blood pressure Gastroesophageal Reflux Disease Anxiety  Disorder Chronic Pain Tinnitus Varicose veins Hemorrhoids Transfusion history. History of Blood Transfusion - 1978 Menopause Measles Hyperlipidemia    Review of Systems General:Not Present-  Chills, Fever, Night Sweats, Fatigue, Weight Gain, Weight Loss and Memory Loss. Skin:Not Present- Hives, Itching, Rash, Eczema and Lesions. HEENT:Present- Hearing Loss. Not Present- Tinnitus, Headache, Double Vision, Visual Loss and Dentures. Respiratory:Not Present- Shortness of breath with exertion, Shortness of breath at rest, Allergies, Coughing up blood and Chronic Cough. Cardiovascular:Not Present- Chest Pain, Racing/skipping heartbeats, Difficulty Breathing Lying Down, Murmur, Swelling and Palpitations. Gastrointestinal:Present- Heartburn. Not Present- Bloody Stool, Abdominal Pain, Vomiting, Nausea, Constipation, Diarrhea, Difficulty Swallowing, Jaundice and Loss of appetitie. Female Genitourinary:Not Present- Blood in Urine, Urinary frequency, Weak urinary stream, Discharge, Flank Pain, Incontinence, Painful Urination, Urgency, Urinary Retention and Urinating at Night. Musculoskeletal:Present- Joint Pain. Not Present- Muscle Weakness, Muscle Pain, Joint Swelling, Back Pain, Morning Stiffness and Spasms. Neurological:Not Present- Tremor, Dizziness, Blackout spells, Paralysis, Difficulty with balance and Weakness. Psychiatric:Not Present- Insomnia.    Vitals Weight: 120 lb Height: 65 in Weight was reported by patient. Height was reported by patient. Body Surface Area: 1.58 m Body Mass Index: 19.97 kg/m Pulse: 64 (Regular) Resp.: 14 (Unlabored) BP: 144/82 (Sitting, Left Arm, Standard)     Physical Exam The physical exam findings are as follows:   General Mental Status - Alert, cooperative and good historian. General Appearance- pleasant. Not in acute distress. Orientation- Oriented X3. Build & Nutrition- Well nourished and Well  developed.   Head and Neck Head- normocephalic, atraumatic . Neck Global Assessment- supple. no bruit auscultated on the right and no bruit auscultated on the left.   Eye Vision- Wears corrective lenses. Pupil- Bilateral- Regular and Round. Motion- Bilateral- EOMI.   Chest and Lung Exam Auscultation: Breath sounds:- clear at anterior chest wall and - clear at posterior chest wall. Adventitious sounds:- No Adventitious sounds.   Cardiovascular Auscultation:Rhythm- Regular rate and rhythm. Heart Sounds- S1 WNL and S2 WNL. Murmurs & Other Heart Sounds:Auscultation of the heart reveals - No Murmurs.   Abdomen Palpation/Percussion:Tenderness- Abdomen is non-tender to palpation. Rigidity (guarding)- Abdomen is soft. Auscultation:Auscultation of the abdomen reveals - Bowel sounds normal.   Female Genitourinary   Note: Not done, not pertinent to present illness  Musculoskeletal   Note: On exam she is alert and oriented in no apparent distress. Her hips with normal motion and no discomfort. Right knee no effusion. There is marked crepitus on range of motion of the knee. She is tender lateral and medial with no instability.  RADIOGRAPHS Previous x-rays, she has bone on bone arthritis medial and patellofemoral.   Assessment & Plan Primary osteoarthritis of both knees (715.16)  Note: Plan is for a Right Total Knee Replacement by Dr. Wynelle Link.  Plan is to go home.  PCP - Dr. Sallee Lange - Patient has been seen preoperatively and felt to be stable for surgery.  The patient does not have any contraindications and will receive TXA (tranexamic acid) prior to surgery.  Signed electronically by Joelene Millin, III PA-C

## 2013-10-21 NOTE — Op Note (Signed)
Pre-operative diagnosis- Osteoarthritis  Right knee(s)  Post-operative diagnosis- Osteoarthritis Right knee(s)  Procedure-  Right  Total Knee Arthroplasty (Attune system)  Surgeon- Dione Plover. Tatiyanna Lashley, MD  Assistant- Ardeen Jourdain, PA-C   Anesthesia-  Spinal EBL-* No blood loss amount entered *  Drains Hemovac  Tourniquet time- 34 minutes @ 654 mm Hg    Complications- None  Condition-PACU - hemodynamically stable.   Brief Clinical Note  Susan Oneill is a 63 y.o. year old female with end stage OA of her right knee with progressively worsening pain and dysfunction. She has constant pain, with activity and at rest and significant functional deficits with difficulties even with ADLs. She has had extensive non-op management including analgesics, injections of cortisone and viscosupplements, and home exercise program, but remains in significant pain with significant dysfunction.Radiographs show bone on bone arthritis medial and patelofemoral. She presents now for right Total Knee Arthroplasty.    Procedure in detail---   The patient is brought into the operating room and positioned supine on the operating table. After successful administration of  Spinal,   a tourniquet is placed high on the  Right thigh(s) and the lower extremity is prepped and draped in the usual sterile fashion. Time out is performed by the operating team and then the  Right lower extremity is wrapped in Esmarch, knee flexed and the tourniquet inflated to 300 mmHg.       A midline incision is made with a ten blade through the subcutaneous tissue to the level of the extensor mechanism. A fresh blade is used to make a medial parapatellar arthrotomy. Soft tissue over the proximal medial tibia is subperiosteally elevated to the joint line with a knife and into the semimembranosus bursa with a Cobb elevator. Soft tissue over the proximal lateral tibia is elevated with attention being paid to avoiding the patellar tendon on the  tibial tubercle. The patella is everted, knee flexed 90 degrees and the ACL and PCL are removed. Findings are bone on bone medial and patellofemoral with large medial osteophytes.        The drill is used to create a starting hole in the distal femur and the canal is thoroughly irrigated with sterile saline to remove the fatty contents. The 5 degree Right  valgus alignment guide is placed into the femoral canal and the distal femoral cutting block is pinned to remove 9 mm off the distal femur. Resection is made with an oscillating saw.      The tibia is subluxed forward and the menisci are removed. The extramedullary alignment guide is placed referencing proximally at the medial aspect of the tibial tubercle and distally along the second metatarsal axis and tibial crest. The block is pinned to remove 55mm off the more deficient medial  side. Resection is made with an oscillating saw. Size 5is the most appropriate size for the tibia and the proximal tibia is prepared with the modular drill and keel punch for that size.      The femoral sizing guide is placed and size 6 is most appropriate. Rotation is marked off the epicondylar axis and confirmed by creating a rectangular flexion gap at 90 degrees. The size 6 cutting block is pinned in this rotation and the anterior, posterior and chamfer cuts are made with the oscillating saw. The intercondylar block is then placed and that cut is made.      Trial size 5 tibial component, trial size 6 posterior stabilized femur and a 8  mm posterior  stabilized rotating platform insert trial is placed. Full extension is achieved with excellent varus/valgus and anterior/posterior balance throughout full range of motion. The patella is everted and thickness measured to be 24  mm. Free hand resection is taken to 14 mm, a 38 template is placed, lug holes are drilled, trial patella is placed, and it tracks normally. Osteophytes are removed off the posterior femur with the trial in  place. All trials are removed and the cut bone surfaces prepared with pulsatile lavage. Cement is mixed and once ready for implantation, the size 5 tibial implant, size  6 posterior stabilized femoral component, and the size 38 patella are cemented in place and the patella is held with the clamp. The trial insert is placed and the knee held in full extension. The Exparel (20 ml mixed with 30 ml saline) and .25% Bupivicaine, are injected into the extensor mechanism, posterior capsule, medial and lateral gutters and subcutaneous tissues.  All extruded cement is removed and once the cement is hard the permanent 8 mm posterior stabilized rotating platform insert is placed into the tibial tray.      The wound is copiously irrigated with saline solution and the extensor mechanism closed over a hemovac drain with #1 PDS suture. The tourniquet is released for a total tourniquet time of 34  minutes. Flexion against gravity is 140 degrees and the patella tracks normally. Subcutaneous tissue is closed with 2.0 vicryl and subcuticular with running 4.0 Monocryl. The incision is cleaned and dried and steri-strips and a bulky sterile dressing are applied. The limb is placed into a knee immobilizer and the patient is awakened and transported to recovery in stable condition.      Please note that a surgical assistant was a medical necessity for this procedure in order to perform it in a safe and expeditious manner. Surgical assistant was necessary to retract the ligaments and vital neurovascular structures to prevent injury to them and also necessary for proper positioning of the limb to allow for anatomic placement of the prosthesis.   Dione Plover Melysa Schroyer, MD    10/21/2013, 10:32 AM

## 2013-10-21 NOTE — Transfer of Care (Signed)
Immediate Anesthesia Transfer of Care Note  Patient: Susan Oneill  Procedure(s) Performed: Procedure(s) (LRB): RIGHT TOTAL KNEE ARTHROPLASTY (Right)  Patient Location: PACU  Anesthesia Type: Spinal  Level of Consciousness: sedated, patient cooperative and responds to stimulation  Airway & Oxygen Therapy: Patient Spontanous Breathing and Patient connected to face mask oxgen  Post-op Assessment: Report given to PACU RN and Post -op Vital signs reviewed and stable  Post vital signs: Reviewed and stable  Complications: No apparent anesthesia complications

## 2013-10-21 NOTE — Plan of Care (Signed)
Problem: Consults Goal: Diagnosis- Total Joint Replacement Primary Total Knee     

## 2013-10-21 NOTE — Anesthesia Procedure Notes (Signed)
Spinal  Patient location during procedure: OR Start time: 10/21/2013 9:19 AM End time: 10/21/2013 9:22 AM Staffing CRNA/Resident: Harle Stanford R Performed by: resident/CRNA  Preanesthetic Checklist Completed: patient identified, site marked, surgical consent, pre-op evaluation, timeout performed, IV checked, risks and benefits discussed and monitors and equipment checked Spinal Block Patient position: sitting Prep: Betadine Patient monitoring: heart rate, cardiac monitor, continuous pulse ox and blood pressure Approach: midline Location: L3-4 Injection technique: single-shot Needle Needle type: Sprotte  Needle gauge: 24 G Needle length: 9 cm Needle insertion depth: 7 cm

## 2013-10-21 NOTE — Interval H&P Note (Signed)
History and Physical Interval Note:  10/21/2013 7:03 AM  Susan Oneill  has presented today for surgery, with the diagnosis of Osteoarthritis of the Right Knee  The various methods of treatment have been discussed with the patient and family. After consideration of risks, benefits and other options for treatment, the patient has consented to  Procedure(s): RIGHT TOTAL KNEE ARTHROPLASTY (Right) as a surgical intervention .  The patient's history has been reviewed, patient examined, no change in status, stable for surgery.  I have reviewed the patient's chart and labs.  Questions were answered to the patient's satisfaction.     Dione Plover Valinda Fedie

## 2013-10-21 NOTE — Anesthesia Postprocedure Evaluation (Signed)
  Anesthesia Post-op Note  Patient: Susan Oneill  Procedure(s) Performed: Procedure(s) (LRB): RIGHT TOTAL KNEE ARTHROPLASTY (Right)  Patient Location: PACU  Anesthesia Type: Spinal  Level of Consciousness: awake and alert   Airway and Oxygen Therapy: Patient Spontanous Breathing  Post-op Pain: mild  Post-op Assessment: Post-op Vital signs reviewed, Patient's Cardiovascular Status Stable, Respiratory Function Stable, Patent Airway and No signs of Nausea or vomiting  Last Vitals:  Filed Vitals:   10/21/13 1228  BP: 116/71  Pulse: 52  Temp: 36.4 C  Resp: 12    Post-op Vital Signs: stable   Complications: No apparent anesthesia complications

## 2013-10-22 DIAGNOSIS — D62 Acute posthemorrhagic anemia: Secondary | ICD-10-CM | POA: Diagnosis not present

## 2013-10-22 LAB — BASIC METABOLIC PANEL
BUN: 11 mg/dL (ref 6–23)
CO2: 26 mEq/L (ref 19–32)
Calcium: 8.3 mg/dL — ABNORMAL LOW (ref 8.4–10.5)
Chloride: 103 mEq/L (ref 96–112)
Creatinine, Ser: 0.66 mg/dL (ref 0.50–1.10)
GFR calc Af Amer: >90 mL/min (ref >90)
GFR calc non Af Amer: >90 mL/min (ref >90)
Glucose, Bld: 113 mg/dL — ABNORMAL HIGH (ref 70–99)
Potassium: 4.2 mEq/L (ref 3.7–5.3)
Sodium: 138 mEq/L (ref 137–147)

## 2013-10-22 LAB — CBC
HCT: 29.9 % — ABNORMAL LOW (ref 36.0–46.0)
Hemoglobin: 9.9 g/dL — ABNORMAL LOW (ref 12.0–15.0)
MCH: 28.5 pg (ref 26.0–34.0)
MCHC: 33.1 g/dL (ref 30.0–36.0)
MCV: 86.2 fL (ref 78.0–100.0)
PLATELETS: 209 10*3/uL (ref 150–400)
RBC: 3.47 MIL/uL — ABNORMAL LOW (ref 3.87–5.11)
RDW: 13.6 % (ref 11.5–15.5)
WBC: 7.7 10*3/uL (ref 4.0–10.5)

## 2013-10-22 MED ORDER — ESOMEPRAZOLE MAGNESIUM 20 MG PO CPDR
20.0000 mg | DELAYED_RELEASE_CAPSULE | Freq: Every day | ORAL | Status: DC
  Administered 2013-10-22 – 2013-10-23 (×2): 20 mg via ORAL
  Filled 2013-10-22 (×4): qty 1

## 2013-10-22 MED ORDER — NON FORMULARY: 20.0000 mg | Freq: Every day | Status: DC

## 2013-10-22 NOTE — Progress Notes (Signed)
Physical Therapy Treatment Patient Details Name: Susan Oneill MRN: [id number] DOB: Feb 06, 1951 Today's Date: 10/22/2013    History of Present Illness s/p R TKA; PMHx: HTN,  bil CTR    PT Comments    Pt reports feeling stiff sitting in recliner. Ambulation improved x 75'. Plans DC tomorrow.  Follow Up Recommendations  Home health PT     Equipment Recommendations  None recommended by PT    Recommendations for Other Services       Precautions / Restrictions Precautions Precautions: Knee Precaution Comments: cyst L wrist for which she wears splint at night, pt to bring and wear if WBing exacerbates discomfort Required Braces or Orthoses: Knee Immobilizer - Right Knee Immobilizer - Right: Discontinue once straight leg raise with < 10 degree lag Restrictions Weight Bearing Restrictions: No    Mobility  Bed Mobility Overal bed mobility: Needs Assistance Bed Mobility: Sit to Supine     Supine to sit: Mod assist Sit to supine: Min assist   General bed mobility comments: assist with R leg onto bed.  Transfers Overall transfer level: Needs assistance Equipment used: Rolling walker (2 wheeled) Transfers: Sit to/from Stand Sit to Stand: Min guard         General transfer comment: cues for hand and r leg position  Ambulation/Gait Ambulation/Gait assistance: Min guard Ambulation Distance (Feet): 70 Feet Assistive device: Rolling walker (2 wheeled) Gait Pattern/deviations: Step-to pattern;Step-through pattern;Antalgic     General Gait Details: cues for sequence   Stairs            Wheelchair Mobility    Modified Rankin (Stroke Patients Only)       Balance                                    Cognition Arousal/Alertness: Awake/alert Behavior During Therapy: WFL for tasks assessed/performed Overall Cognitive Status: Within Functional Limits for tasks assessed                      Exercises      General Comments         Pertinent Vitals/Pain 7 R knee, ice applied.    Home Living Family/patient expects to be discharged to:: Private residence Living Arrangements: Spouse/significant other Available Help at Discharge: Family Type of Home: House Home Access: Stairs to enter   [place]: One level        Prior Function Level of Independence: Independent      Comments: husband just out of hospital but can provide assistance   PT Goals (current goals can now be found in the care plan section) Acute Rehab PT Goals Patient Stated Goal: return to being independent Progress towards PT goals: Progressing toward goals    Frequency  7X/week    PT Plan Current plan remains appropriate    Co-evaluation             End of Session Equipment Utilized During Treatment: Gait belt Activity Tolerance: Patient tolerated treatment well;Patient limited by pain Patient left: in bed;with call bell/phone within reach;with family/visitor present     Time: 0175-1025 PT Time Calculation (min): 32 min  Charges:  $Gait Training: 23-37 mins                    G Codes:      Claretha Cooper 10/22/2013, 1:08 PM Tresa Endo PT [phone]

## 2013-10-22 NOTE — Progress Notes (Signed)
Physical Therapy Treatment Patient Details Name: Susan Oneill MRN: [id number] DOB: [birth year] Today's Date: 10/22/2013    History of Present Illness s/p R TKA; PMHx: HTN,  bil CTR    PT Comments    Progressing well. Plans Dc tomorrow.  Follow Up Recommendations  Home health PT     Equipment Recommendations  None recommended by PT    Recommendations for Other Services       Precautions / Restrictions Precautions Precautions: Knee Precaution Comments: cyst L wrist for which she wears splint at night, pt to bring and wear if WBing exacerbates discomfort Required Braces or Orthoses: Knee Immobilizer - Right Knee Immobilizer - Right: Discontinue once straight leg raise with < 10 degree lag    Mobility  Bed Mobility   Bed Mobility: Supine to Sit     Supine to sit: Min guard     General bed mobility comments: cues for technique.  Transfers Overall transfer level: Needs assistance Equipment used: Rolling walker (2 wheeled) Transfers: Sit to/from Stand Sit to Stand: Min guard         General transfer comment: cues for hand and r leg position  Ambulation/Gait Ambulation/Gait assistance: Min guard Ambulation Distance (Feet): 85 Feet Assistive device:  (and 20') Gait Pattern/deviations: Step-through pattern;Antalgic     General Gait Details: cues for sequence and to roll walker   Stairs            Wheelchair Mobility    Modified Rankin (Stroke Patients Only)       Balance                                    Cognition Arousal/Alertness: Awake/alert Behavior During Therapy: WFL for tasks assessed/performed                        Exercises Total Joint Exercises Ankle Circles/Pumps: AROM;Both;10 reps;Supine Towel Squeeze: AROM;Both;10 reps;Supine Heel Slides: AAROM;Right;10 reps;Supine Hip ABduction/ADduction: AROM;Right;10 reps;Supine Straight Leg Raises: AAROM;Right;10 reps;Supine Goniometric ROM: 10-50 degrees  R knee    General Comments        Pertinent Vitals/Pain R knee 5, ice, to get meds.    Home Living                      Prior Function            PT Goals (current goals can now be found in the care plan section) Progress towards PT goals: Progressing toward goals    Frequency  7X/week    PT Plan Current plan remains appropriate    Co-evaluation             End of Session Equipment Utilized During Treatment: Right knee immobilizer Activity Tolerance: Patient tolerated treatment well;Patient limited by pain Patient left: in chair;with call bell/phone within reach;with family/visitor present     Time: 0354-6568 PT Time Calculation (min): 28 min  Charges:  $Gait Training: 8-22 mins $Therapeutic Exercise: 8-22 mins                    G Codes:      Claretha Cooper 10/22/2013, 5:21 PM

## 2013-10-22 NOTE — Evaluation (Signed)
Occupational Therapy Evaluation Patient Details Name: Susan Oneill MRN: [id number] DOB: [birth year] Today's Date: 10/22/2013    History of Present Illness s/p R TKA; PMHx: HTN,  bil CTR   Clinical Impression   Pt was admitted for R TKA.  Will check on pt one more time to review toilet transfers/adls as needed.  She will not need post acute OT    Follow Up Recommendations  No OT follow up    Equipment Recommendations  3 in 1 bedside comode    Recommendations for Other Services       Precautions / Restrictions Precautions Precautions: Knee Precaution Comments: cyst L wrist for which she wears splint at night, pt to bring and wear if WBing exacerbates discomfort Restrictions Weight Bearing Restrictions: No      Mobility Bed Mobility Overal bed mobility: Needs Assistance Bed Mobility: Supine to Sit     Supine to sit: Mod assist     General bed mobility comments: assist with UB and RLE, incr time, HOB raised about 40 degrees  Transfers Overall transfer level: Needs assistance Equipment used: Rolling walker (2 wheeled) Transfers: Sit to/from Stand Sit to Stand: Min guard         General transfer comment: assist for RLE when scooting back in chair    Balance                                            ADL Overall ADL's : Needs assistance/impaired     Grooming: Oral care;Sitting   Upper Body Bathing: Set up;Sitting   Lower Body Bathing: Minimal assistance;Sit to/from stand   Upper Body Dressing : Minimal assistance;Sitting (iv)   Lower Body Dressing: Moderate assistance;Sit to/from stand   Toilet Transfer: Minimal assistance;Stand-pivot   Toileting- Water quality scientist and Hygiene: Min guard;Sit to/from stand       Functional mobility during ADLs: Minimal assistance General ADL Comments: Performed ADL from EOB.  Husband will assist with adls as needed.  Educated on [place].  Educated on tub readiness.  Pt will sponge bathe  initially.  She has a high bed with a stool.     Vision                     Perception     Praxis      Pertinent Vitals/Pain 4/10 R knee; repositioned and ice applied     Hand Dominance     Extremity/Trunk Assessment Upper Extremity Assessment Upper Extremity Assessment: Overall WFL for tasks assessed (cyst in L wrist)           Communication Communication Communication: No difficulties   Cognition Arousal/Alertness: Awake/alert Behavior During Therapy: WFL for tasks assessed/performed Overall Cognitive Status: Within Functional Limits for tasks assessed                     General Comments       Exercises       Shoulder Instructions      Home Living Family/patient expects to be discharged to:: Private residence Living Arrangements: Spouse/significant other Available Help at Discharge: Family Type of Home: House Home Access: Stairs to enter [employer] of Steps: 1 and 1 or 2??   Home Layout: One level     Bathroom Shower/Tub: Tub/shower unit Shower/tub characteristics: Architectural technologist: Standard  Prior Functioning/Environment Level of Independence: Independent        Comments: husband just out of hospital but can provide assistance    OT Diagnosis: Generalized weakness   OT Problem List: Decreased strength;Decreased knowledge of use of DME or AE;Pain   OT Treatment/Interventions: Self-care/ADL training;Energy conservation;Patient/family education    OT Goals(Current goals can be found in the care plan section) Acute Rehab OT Goals Patient Stated Goal: return to being independent OT Goal Formulation: With patient Time For Goal Achievement: 11/05/13 Potential to Achieve Goals: Good ADL Goals Pt Will Perform Grooming: with supervision;standing Pt Will Transfer to Toilet: with supervision;ambulating;bedside commode Pt Will Perform Toileting - Clothing Manipulation and hygiene: with  supervision;sit to/from stand  OT Frequency: Min 2X/week   Barriers to D/C:            Co-evaluation              End of Session CPM Right Knee CPM Right Knee: Off  Activity Tolerance:   Patient left:     Time: 0865-7846 OT Time Calculation (min): 32 min Charges:  OT General Charges $OT Visit: 1 Procedure OT Evaluation $Initial OT Evaluation Tier I: 1 Procedure OT Treatments $Self Care/Home Management : 23-37 mins G-Codes:    Lesle Chris 11/07/13, 9:54 AM   Lesle Chris, OTR/L [phone] 11-07-13

## 2013-10-22 NOTE — Progress Notes (Signed)
Patient has chosen to use [facility] pt needs and dme/Susan Laurene Melendrez,RN,BSN,CCM  

## 2013-10-22 NOTE — Discharge Instructions (Addendum)
° °Dr. Frank Aluisio °Total Joint Specialist °[facility] °[address] °[place], [place] [address] °[phone] ° °TOTAL KNEE REPLACEMENT POSTOPERATIVE DIRECTIONS ° ° ° °Knee Rehabilitation, Guidelines Following Surgery  °Results after knee surgery are often greatly improved when you follow the exercise, range of motion and muscle strengthening exercises prescribed by your doctor. Safety measures are also important to protect the knee from further injury. Any time any of these exercises cause you to have increased pain or swelling in your knee joint, decrease the amount until you are comfortable again and slowly increase them. If you have problems or questions, call your caregiver or physical therapist for advice.  ° °HOME CARE INSTRUCTIONS  °Remove items at home which could result in a fall. This includes throw rugs or furniture in walking pathways.  °Continue medications as instructed at time of discharge. °You may have some home medications which will be placed on hold until you complete the course of blood thinner medication.  °You may start showering once you are discharged home but do not submerge the incision under water. Just pat the incision dry and apply a dry gauze dressing on daily. °Walk with walker as instructed.  °You may resume a sexual relationship in one month or when given the OK by  your doctor.  °· Use walker as long as suggested by your caregivers. °· Avoid periods of inactivity such as sitting longer than an hour when not asleep. This helps prevent blood clots.  °You may put full weight on your legs and walk as much as is comfortable.  °You may return to work once you are cleared by your doctor.  °Do not drive a car for 6 weeks or until released by you surgeon.  °· Do not drive while taking narcotics.  °Wear the elastic stockings for three weeks following surgery during the day but you may remove then at night. °Make sure you keep all of your appointments after your  operation with all of your doctors and caregivers. You should call the office at the above phone number and make an appointment for approximately two weeks after the date of your surgery. °Change the dressing daily and reapply a dry dressing each time. °Please pick up a stool softener and laxative for home use as long as you are requiring pain medications. °· Continue to use ice on the knee for pain and swelling from surgery. You may notice swelling that will progress down to the foot and ankle.  This is normal after surgery.  Elevate the leg when you are not up walking on it.   °It is important for you to complete the blood thinner medication as prescribed by your doctor. °· Continue to use the breathing machine which will help keep your temperature down.  It is common for your temperature to cycle up and down following surgery, especially at night when you are not up moving around and exerting yourself.  The breathing machine keeps your lungs expanded and your temperature down. ° °RANGE OF MOTION AND STRENGTHENING EXERCISES  °Rehabilitation of the knee is important following a knee injury or an operation. After just a few days of immobilization, the muscles of the thigh which control the knee become weakened and shrink (atrophy). Knee exercises are designed to build up the tone and strength of the thigh muscles and to improve knee motion. Often times heat used for twenty to thirty minutes before working out will loosen up your tissues and help with improving the   range of motion but do not use heat for the first two weeks following surgery. These exercises can be done on a training (exercise) mat, on the floor, on a table or on a bed. Use what ever works the best and is most comfortable for you Knee exercises include:  °Leg Lifts - While your knee is still immobilized in a splint or cast, you can do straight leg raises. Lift the leg to 60 degrees, hold for 3 sec, and slowly lower the leg. Repeat 10-20 times 2-3  times daily. Perform this exercise against resistance later as your knee gets better.  °Quad and Hamstring Sets - Tighten up the muscle on the front of the thigh (Quad) and hold for 5-10 sec. Repeat this 10-20 times hourly. Hamstring sets are done by pushing the foot backward against an object and holding for 5-10 sec. Repeat as with quad sets.  °A rehabilitation program following serious knee injuries can speed recovery and prevent re-injury in the future due to weakened muscles. Contact your doctor or a physical therapist for more information on knee rehabilitation.  ° °SKILLED REHAB INSTRUCTIONS: °If the patient is transferred to a skilled rehab facility following release from the hospital, a list of the current medications will be sent to the facility for the patient to continue.  When discharged from the skilled rehab facility, please have the facility set up the patient's Home Health Physical Therapy prior to being released. Also, the skilled facility will be responsible for providing the patient with their medications at time of release from the facility to include their pain medication, the muscle relaxants, and their blood thinner medication. If the patient is still at the rehab facility at time of the two week follow up appointment, the skilled rehab facility will also need to assist the patient in arranging follow up appointment in our office and any transportation needs. ° °MAKE SURE YOU:  °Understand these instructions.  °Will watch your condition.  °Will get help right away if you are not doing well or get worse.  ° ° °Pick up stool softner and laxative for home. °Do not submerge incision under water. °May shower. °Continue to use ice for pain and swelling from surgery. ° °Take Xarelto for two and a half more weeks, then discontinue Xarelto. °Once the patient has completed the blood thinner regimen, then take a Baby 81 mg Aspirin daily for three more weeks. ° ° ° ° ° ° ° ° °Information on my medicine -  XARELTO® (Rivaroxaban) ° °This medication education was reviewed with me or my healthcare representative as part of my discharge preparation.  The pharmacist that spoke with me during my hospital stay was:  Christine E Shade, RPH ° °Why was Xarelto® prescribed for you? °Xarelto® was prescribed for you to reduce the risk of blood clots forming after orthopedic surgery. The medical term for these abnormal blood clots is venous thromboembolism (VTE). ° °What do you need to know about xarelto® ? °Take your Xarelto® ONCE DAILY at the same time every day. °You may take it either with or without food. ° °If you have difficulty swallowing the tablet whole, you may crush it and mix in applesauce just prior to taking your dose. ° °Take Xarelto® exactly as prescribed by your doctor and DO NOT stop taking Xarelto® without talking to the doctor who prescribed the medication.  Stopping without other VTE prevention medication to take the place of Xarelto® may increase your risk of developing a clot. ° °After   check-up appointments with your healthcare provider that is prescribing your Xarelto®.   ° °What do you do if you miss a dose? °If you miss a dose, take it as soon as you remember on the same day then continue your regularly scheduled once daily regimen the next day. Do not take two doses of Xarelto® on the same day.  ° °Important Safety Information °A possible side effect of Xarelto® is bleeding. You should call your healthcare provider right away if you experience any of the following: °  Bleeding from an injury or your nose that does not stop. °  Unusual colored urine (red or dark brown) or unusual colored stools (red or black). °  Unusual bruising for unknown reasons. °  A serious fall or if you hit your head (even if there is no bleeding). ° °Some medicines may interact with Xarelto® and might increase your risk of bleeding while on Xarelto®. To help avoid this, consult your healthcare provider or  pharmacist prior to using any new prescription or non-prescription medications, including herbals, vitamins, non-steroidal anti-inflammatory drugs (NSAIDs) and supplements. ° °This website has more information on Xarelto®: www.xarelto.com. ° ° ° °

## 2013-10-22 NOTE — Progress Notes (Signed)
   Subjective: 1 Day Post-Op Procedure(s) (LRB): RIGHT TOTAL KNEE ARTHROPLASTY (Right) Patient reports pain as mild.   Patient seen in rounds with Dr. Wynelle Link. Patient is well, and has had no acute complaints or problems We will start therapy today.  Plan is to go Home after hospital stay.  Objective: Vital signs in last 24 hours: Temp:  [96.6 F (35.9 C)-98.1 F (36.7 C)] 98.1 F (36.7 C) (04/21 0453) Pulse Rate:  [48-66] 60 (04/21 0453) Resp:  [9-17] 16 (04/21 0453) BP: (97-117)/(41-72) 107/69 mmHg (04/21 0453) SpO2:  [92 %-100 %] 99 % (04/21 0453) Weight:  [97.07 kg (214 lb)] 97.07 kg (214 lb) (04/20 1225)  Intake/Output from previous day:  Intake/Output Summary (Last 24 hours) at 10/22/13 0831 Last data filed at 10/22/13 0600  Gross per 24 hour  Intake 5972.5 ml  Output   1175 ml  Net 4797.5 ml    Intake/Output this shift:    Labs:  Recent Labs  10/22/13 0357  HGB 9.9*    Recent Labs  10/22/13 0357  WBC 7.7  RBC 3.47*  HCT 29.9*  PLT 209    Recent Labs  10/22/13 0357  NA 138  K 4.2  CL 103  CO2 26  BUN 11  CREATININE 0.66  GLUCOSE 113*  CALCIUM 8.3*   No results found for this basename: LABPT, INR,  in the last 72 hours  EXAM General - Patient is Alert, Appropriate and Oriented Extremity - Neurovascular intact Sensation intact distally Dorsiflexion/Plantar flexion intact Dressing - dressing C/D/I Motor Function - intact, moving foot and toes well on exam.  Hemovac pulled without difficulty.  Past Medical History  Diagnosis Date  . HTN (hypertension)   . Acid reflux   . Urticaria   . Tinnitus   . Varicose veins   . Hemorrhoids   . Transfusion history 1978    after childbirth - not all of placenta passed and pt started bleeding  . Hyperlipidemia   . Uterine fibroid     no problems from the fibroids  . Arthritis     oa and pain both knees  . Cyst in hand     ?? CYST BACK OF LEFT HAND - PT STATES THE AREA POPPED UP - RAISED  AREA - SOMETIMES PAINFUL - HAS A BRACE TO WEAR ON THAT HAND WHEN SLEEPING  . Atypical chest pain 5 / 2014    DISCHARGE SUMMARY [facility] - RULLED OUT FOR MI, NEGATIVE NUCLEAR STRESS TEST - PAIN ATTRIBUTED TO PT'S GERD- PT STATES NO FURTHER CHEST PAINS - [name] HAS HELPED PAIN    Assessment/Plan: 1 Day Post-Op Procedure(s) (LRB): RIGHT TOTAL KNEE ARTHROPLASTY (Right) Principal Problem:   OA (osteoarthritis) of knee Active Problems:   Postoperative anemia due to acute blood loss  Estimated body mass index is 35.61 kg/(m^2) as calculated from the following:   Height as of this encounter: 5\' 5"  (1.651 m).   Weight as of this encounter: 97.07 kg (214 lb). Advance diet Up with therapy Plan for discharge tomorrow Discharge home with home health  DVT Prophylaxis - Xarelto Weight-Bearing as tolerated to right leg D/C O2 and Pulse OX and try on Room Air  Susan Oneill [place] 10/22/2013, 8:31 AM

## 2013-10-23 LAB — BASIC METABOLIC PANEL
BUN: 13 mg/dL (ref 6–23)
CHLORIDE: 100 meq/L (ref 96–112)
CO2: 27 meq/L (ref 19–32)
Calcium: 8.6 mg/dL (ref 8.4–10.5)
Creatinine, Ser: 0.63 mg/dL (ref 0.50–1.10)
GFR calc non Af Amer: >90 mL/min (ref >90)
Glucose, Bld: 92 mg/dL (ref 70–99)
POTASSIUM: 3.7 meq/L (ref 3.7–5.3)
SODIUM: 136 meq/L — AB (ref 137–147)

## 2013-10-23 LAB — CBC
HCT: 29.5 % — ABNORMAL LOW (ref 36.0–46.0)
Hemoglobin: 9.3 g/dL — ABNORMAL LOW (ref 12.0–15.0)
MCH: 27.5 pg (ref 26.0–34.0)
MCHC: 31.5 g/dL (ref 30.0–36.0)
MCV: 87.3 fL (ref 78.0–100.0)
PLATELETS: 188 10*3/uL (ref 150–400)
RBC: 3.38 MIL/uL — AB (ref 3.87–5.11)
RDW: 13.8 % (ref 11.5–15.5)
WBC: 7.7 10*3/uL (ref 4.0–10.5)

## 2013-10-23 MED ORDER — METHOCARBAMOL 500 MG PO TABS: 500.0000 mg | ORAL_TABLET | Freq: Four times a day (QID) | ORAL | Status: DC | PRN

## 2013-10-23 MED ORDER — RIVAROXABAN 10 MG PO TABS: 10.0000 mg | ORAL_TABLET | Freq: Every day | ORAL | Status: DC

## 2013-10-23 MED ORDER — TRAMADOL HCL 50 MG PO TABS: 50.0000 mg | ORAL_TABLET | ORAL | Status: DC | PRN

## 2013-10-23 MED ORDER — OXYCODONE HCL 5 MG PO TABS: 5.0000 mg | ORAL_TABLET | ORAL | Status: DC | PRN

## 2013-10-23 NOTE — Progress Notes (Signed)
Physical Therapy Treatment Patient Details Name: [name] MRN: [id number] DOB: 15-Mar-1951 Today's Date: 10/23/2013    History of Present Illness s/p R TKA; PMHx: HTN,  bil CTR    PT Comments    POD # 2 am session.  Pt slow and groggy with c/o poor sleep last night and pain/spasams.  Pt will need 2 PT sessions prior to D/C.  Follow Up Recommendations        Equipment Recommendations       Recommendations for Other Services       Precautions / Restrictions   KI   Mobility  Bed Mobility  Min assist to suppor R LE off bed and increased time                Transfers    Min assist off bed and on/off toilet with increased time and 50% VC's on proper tech and hand placement.                Ambulation/Gait  amb in hallway with RW 125 feet MinGuard assist with increased time               Stairs  up/down one step forward with RW at MinAssist and 50% VC's on proper sequencing and safety.          Wheelchair Mobility    Modified Rankin (Stroke Patients Only)       Balance                                    Cognition                            Exercises   Total Knee Replacement TE's 10 reps B LE ankle pumps 10 reps towel squeezes 10 reps knee presses 10 reps heel slides  10 reps SAQ's 10 reps SLR's 10 reps ABD Followed by ICE    General Comments        Pertinent Vitals/Pain     Home Living                      Prior Function            PT Goals (current goals can now be found in the care plan section)      Frequency       PT Plan      Co-evaluation             End of Session           Time: 1050-1130 PT Time Calculation (min): 40 min  Charges:  $Gait Training: 8-22 mins $Therapeutic Exercise: 8-22 mins $Therapeutic Activity: 8-22 mins                    G Codes:      Rica Koyanagi  PTA [facility]  Acute  Rehab Pager      [phone]

## 2013-10-23 NOTE — Progress Notes (Signed)
OT Cancellation Note  Patient Details Name: TOSHIA LARKIN MRN: [id number] DOB: 1951-04-06   Cancelled Treatment:    Reason Eval/Treat Not Completed: Other (comment).  Checked with pt.  She has been getting up and going to bathroom with assistance; no further OT needs. Will sign off.    Lesle Chris 10/23/2013, 10:12 AM Lesle Chris, OTR/L [phone] 10/23/2013

## 2013-10-23 NOTE — Discharge Summary (Signed)
Physician Discharge Summary   Patient ID: Susan Oneill MRN: [id number] DOB/AGE: 61-Apr-1952 61 y.o.  Admit date: 10/21/2013 Discharge date: 10/23/2013  Primary Diagnosis:  Osteoarthritis Right knee(s)  Admission Diagnoses:  Past Medical History  Diagnosis Date  . HTN (hypertension)   . Acid reflux   . Urticaria   . Tinnitus   . Varicose veins   . Hemorrhoids   . Transfusion history 1978    after childbirth - not all of placenta passed and pt started bleeding  . Hyperlipidemia   . Uterine fibroid     no problems from the fibroids  . Arthritis     oa and pain both knees  . Cyst in hand     ?? CYST BACK OF LEFT HAND - PT STATES THE AREA POPPED UP - RAISED AREA - SOMETIMES PAINFUL - HAS A BRACE TO WEAR ON THAT HAND WHEN SLEEPING  . Atypical chest pain 5 / 2014    DISCHARGE SUMMARY [place] - RULLED OUT FOR MI, NEGATIVE NUCLEAR STRESS TEST - PAIN ATTRIBUTED TO PT'S GERD- PT STATES NO FURTHER CHEST PAINS - [place] HAS HELPED PAIN   Discharge Diagnoses:   Principal Problem:   OA (osteoarthritis) of knee Active Problems:   Postoperative anemia due to acute blood loss  Estimated body mass index is 35.61 kg/(m^2) as calculated from the following:   Height as of this encounter: 5' 5"  (1.651 m).   Weight as of this encounter: 97.07 kg (214 lb).  Procedure:  Procedure(s) (LRB): RIGHT TOTAL KNEE ARTHROPLASTY (Right)   Consults: None  HPI: Susan Oneill is a 63 y.o. year old female with end stage OA of her right knee with progressively worsening pain and dysfunction. She has constant pain, with activity and at rest and significant functional deficits with difficulties even with ADLs. She has had extensive non-op management including analgesics, injections of cortisone and viscosupplements, and home exercise program, but remains in significant pain with significant dysfunction.Radiographs show bone on bone arthritis medial and patelofemoral. She presents now for right Total Knee  Arthroplasty.   Laboratory Data: Admission on 10/21/2013, Discharged on 10/23/2013  Component Date Value Ref Range Status  . ABO/RH(D) 10/21/2013 A POS   Final  . Antibody Screen 10/21/2013 NEG   Final  . Sample Expiration 10/21/2013 10/24/2013   Final  . ABO/RH(D) 10/21/2013 A POS   Final  . WBC 10/22/2013 7.7  4.0 - 10.5 K/uL Final  . RBC 10/22/2013 3.47* 3.87 - 5.11 MIL/uL Final  . Hemoglobin 10/22/2013 9.9* 12.0 - 15.0 g/dL Final  . HCT 10/22/2013 29.9* 36.0 - 46.0 % Final  . MCV 10/22/2013 86.2  78.0 - 100.0 fL Final  . MCH 10/22/2013 28.5  26.0 - 34.0 pg Final  . MCHC 10/22/2013 33.1  30.0 - 36.0 g/dL Final  . RDW 10/22/2013 13.6  11.5 - 15.5 % Final  . Platelets 10/22/2013 209  150 - 400 K/uL Final  . Sodium 10/22/2013 138  137 - 147 mEq/L Final  . Potassium 10/22/2013 4.2  3.7 - 5.3 mEq/L Final  . Chloride 10/22/2013 103  96 - 112 mEq/L Final  . CO2 10/22/2013 26  19 - 32 mEq/L Final  . Glucose, Bld 10/22/2013 113* 70 - 99 mg/dL Final  . BUN 10/22/2013 11  6 - 23 mg/dL Final  . Creatinine, Ser 10/22/2013 0.66  0.50 - 1.10 mg/dL Final  . Calcium 10/22/2013 8.3* 8.4 - 10.5 mg/dL Final  . GFR calc non Af Amer 10/22/2013 >  90  >90 mL/min Final  . GFR calc Af Amer 10/22/2013 >90  >90 mL/min Final   Comment: (NOTE)                          The eGFR has been calculated using the CKD EPI equation.                          This calculation has not been validated in all clinical situations.                          eGFR's persistently <90 mL/min signify possible Chronic Kidney                          Disease.  . WBC 10/23/2013 7.7  4.0 - 10.5 K/uL Final  . RBC 10/23/2013 3.38* 3.87 - 5.11 MIL/uL Final  . Hemoglobin 10/23/2013 9.3* 12.0 - 15.0 g/dL Final  . HCT 10/23/2013 29.5* 36.0 - 46.0 % Final  . MCV 10/23/2013 87.3  78.0 - 100.0 fL Final  . MCH 10/23/2013 27.5  26.0 - 34.0 pg Final  . MCHC 10/23/2013 31.5  30.0 - 36.0 g/dL Final  . RDW 10/23/2013 13.8  11.5 - 15.5 % Final    . Platelets 10/23/2013 188  150 - 400 K/uL Final  . Sodium 10/23/2013 136* 137 - 147 mEq/L Final  . Potassium 10/23/2013 3.7  3.7 - 5.3 mEq/L Final  . Chloride 10/23/2013 100  96 - 112 mEq/L Final  . CO2 10/23/2013 27  19 - 32 mEq/L Final  . Glucose, Bld 10/23/2013 92  70 - 99 mg/dL Final  . BUN 10/23/2013 13  6 - 23 mg/dL Final  . Creatinine, Ser 10/23/2013 0.63  0.50 - 1.10 mg/dL Final  . Calcium 10/23/2013 8.6  8.4 - 10.5 mg/dL Final  . GFR calc non Af Amer 10/23/2013 >90  >90 mL/min Final  . GFR calc Af Amer 10/23/2013 >90  >90 mL/min Final   Comment: (NOTE)                          The eGFR has been calculated using the CKD EPI equation.                          This calculation has not been validated in all clinical situations.                          eGFR's persistently <90 mL/min signify possible Chronic Kidney                          Disease.  Hospital Outpatient Visit on 10/17/2013  Component Date Value Ref Range Status  . MRSA, PCR 10/17/2013 NEGATIVE  NEGATIVE Final  . Staphylococcus aureus 10/17/2013 NEGATIVE  NEGATIVE Final   Comment:                                 The Xpert SA Assay (FDA                          approved for NASAL specimens  in patients over 65 years of age),                          is one component of                          a comprehensive surveillance                          program.  Test performance has                          been validated by [employer] for patients greater                          than or equal to 37 year old.                          It is not intended                          to diagnose infection nor to                          guide or monitor treatment.  Marland Kitchen aPTT 10/17/2013 33  24 - 37 seconds Final  . WBC 10/17/2013 4.8  4.0 - 10.5 K/uL Final  . RBC 10/17/2013 4.44  3.87 - 5.11 MIL/uL Final  . Hemoglobin 10/17/2013 12.5  12.0 - 15.0 g/dL Final  . HCT 10/17/2013  38.2  36.0 - 46.0 % Final  . MCV 10/17/2013 86.0  78.0 - 100.0 fL Final  . MCH 10/17/2013 28.2  26.0 - 34.0 pg Final  . MCHC 10/17/2013 32.7  30.0 - 36.0 g/dL Final  . RDW 10/17/2013 13.8  11.5 - 15.5 % Final  . Platelets 10/17/2013 245  150 - 400 K/uL Final  . Sodium 10/17/2013 137  137 - 147 mEq/L Final  . Potassium 10/17/2013 3.9  3.7 - 5.3 mEq/L Final  . Chloride 10/17/2013 99  96 - 112 mEq/L Final  . CO2 10/17/2013 26  19 - 32 mEq/L Final  . Glucose, Bld 10/17/2013 76  70 - 99 mg/dL Final  . BUN 10/17/2013 14  6 - 23 mg/dL Final  . Creatinine, Ser 10/17/2013 0.67  0.50 - 1.10 mg/dL Final  . Calcium 10/17/2013 9.2  8.4 - 10.5 mg/dL Final  . Total Protein 10/17/2013 7.1  6.0 - 8.3 g/dL Final  . Albumin 10/17/2013 3.9  3.5 - 5.2 g/dL Final  . AST 10/17/2013 21  0 - 37 U/L Final  . ALT 10/17/2013 17  0 - 35 U/L Final  . Alkaline Phosphatase 10/17/2013 75  39 - 117 U/L Final  . Total Bilirubin 10/17/2013 <0.2* 0.3 - 1.2 mg/dL Final  . GFR calc non Af Amer 10/17/2013 >90  >90 mL/min Final  . GFR calc Af Amer 10/17/2013 >90  >90 mL/min Final   Comment: (NOTE)                          The eGFR has been calculated  using the CKD EPI equation.                          This calculation has not been validated in all clinical situations.                          eGFR's persistently <90 mL/min signify possible Chronic Kidney                          Disease.  Marland Kitchen Prothrombin Time 10/17/2013 12.5  11.6 - 15.2 seconds Final  . INR 10/17/2013 0.95  0.00 - 1.49 Final  . Color, Urine 10/17/2013 YELLOW  YELLOW Final  . APPearance 10/17/2013 CLEAR  CLEAR Final  . Specific Gravity, Urine 10/17/2013 1.018  1.005 - 1.030 Final  . pH 10/17/2013 5.0  5.0 - 8.0 Final  . Glucose, UA 10/17/2013 NEGATIVE  NEGATIVE mg/dL Final  . Hgb urine dipstick 10/17/2013 NEGATIVE  NEGATIVE Final  . Bilirubin Urine 10/17/2013 NEGATIVE  NEGATIVE Final  . Ketones, ur 10/17/2013 NEGATIVE  NEGATIVE mg/dL Final  . Protein, ur  10/17/2013 NEGATIVE  NEGATIVE mg/dL Final  . Urobilinogen, UA 10/17/2013 0.2  0.0 - 1.0 mg/dL Final  . Nitrite 10/17/2013 NEGATIVE  NEGATIVE Final  . Leukocytes, UA 10/17/2013 SMALL* NEGATIVE Final  . Squamous Epithelial / LPF 10/17/2013 RARE  RARE Final  . WBC, UA 10/17/2013 3-6  <3 WBC/hpf Final  . RBC / HPF 10/17/2013 0-2  <3 RBC/hpf Final  . Bacteria, UA 10/17/2013 RARE  RARE Final     X-Rays:No results found.  EKG: Orders placed during the hospital encounter of 11/16/12  . ED EKG  . ED EKG  . EKG 12-LEAD  . EKG 12-LEAD  . EKG 12-LEAD  . EKG 12-LEAD  . EKG  . EXERCISE TOLERANCE TEST  . EXERCISE TOLERANCE TEST     Hospital Course: Nico Rogness Giorgio is a 63 y.o. who was admitted to [facility]. They were brought to the operating room on 10/21/2013 and underwent Procedure(s): RIGHT TOTAL KNEE ARTHROPLASTY.  Patient tolerated the procedure well and was later transferred to the recovery room and then to the orthopaedic floor for postoperative care.  They were given PO and IV analgesics for pain control following their surgery.  They were given 24 hours of postoperative antibiotics of  Anti-infectives   Start     Dose/Rate Route Frequency Ordered Stop   10/21/13 2100  vancomycin (VANCOCIN) IVPB 1000 mg/200 mL premix     1,000 mg 200 mL/hr over 60 Minutes Intravenous Every 12 hours 10/21/13 1237 10/21/13 2154   10/21/13 0700  vancomycin (VANCOCIN) IVPB 1000 mg/200 mL premix     1,000 mg 200 mL/hr over 60 Minutes Intravenous On call to O.R. 10/21/13 0700 10/21/13 0905     and started on DVT prophylaxis in the form of Xarelto.   PT and OT were ordered for total joint protocol.  Discharge planning consulted to help with postop disposition and equipment needs.  Patient had a good night on the evening of surgery.  They started to get up OOB with therapy on day one. Hemovac drain was pulled without difficulty.  Continued to work with therapy into day two.  Dressing was changed on  day two and the incision was healing well.  Patient was seen in rounds and was ready to go home that afternoon after therapy.  Discharge  home with home health  Diet - Cardiac diet  Follow up - in 2 weeks  Activity - WBAT  Disposition - Home  Condition Upon Discharge - Good  D/C Meds - See DC Summary  DVT Prophylaxis - Xarelto       Discharge Orders   Future Appointments Provider Department Dept Phone   11/14/2013 1:45 PM Amy Reyes Ivan [facility] [phone]   11/18/2013 3:15 PM Amy Sula Soda, PTA [facility] [phone]   11/20/2013 1:00 PM Reyne Dumas, PTA [place] [facility] [phone]   11/21/2013 1:00 PM Amy Reyes Ivan [facility] [phone]   11/26/2013 1:00 PM Amy Reyes Ivan [facility] [phone]   11/28/2013 1:00 PM Amy Reyes Ivan [facility] [phone]   11/29/2013 3:15 PM Leeroy Cha, PT [place] [facility] [phone]   Future Orders Complete By Expires   Call MD / Call 911  As directed    Change dressing  As directed    Constipation Prevention  As directed    Diet - low sodium heart healthy  As directed    Discharge instructions  As directed    Do not put a pillow under the knee. Place it under the heel.  As directed    Do not sit on low chairs, stoools or toilet seats, as it may be difficult to get up from low surfaces  As directed    Driving restrictions  As directed    Increase activity slowly as tolerated  As directed    Lifting restrictions  As directed    Patient may shower  As directed    TED hose  As directed    Weight bearing as tolerated  As directed    Questions:     Laterality:     Extremity:         Medication List    STOP taking these medications       HYDROcodone-acetaminophen 5-325 MG per tablet  Commonly known as:  NORCO/VICODIN     IBUPROFEN PM  200-38 MG Tabs  Generic drug:  Ibuprofen-Diphenhydramine Cit     naproxen 500 MG tablet  Commonly known as:  NAPROSYN      TAKE these medications       esomeprazole 20 MG capsule  Commonly known as:  NEXIUM  Take 20 mg by mouth daily at 12 noon.     hydrochlorothiazide 25 MG tablet  Commonly known as:  HYDRODIURIL  Take 25 mg by mouth every morning.     lisinopril 20 MG tablet  Commonly known as:  PRINIVIL,ZESTRIL  Take 20 mg by mouth every evening.     methocarbamol 500 MG tablet  Commonly known as:  ROBAXIN  Take 1 tablet (500 mg total) by mouth every 6 (six) hours as needed for muscle spasms.     nitroGLYCERIN 0.4 MG SL tablet  Commonly known as:  NITROSTAT  Place 1 tablet (0.4 mg total) under the tongue every 5 (five) minutes as needed for chest pain.     oxyCODONE 5 MG immediate release tablet  Commonly known as:  Oxy IR/ROXICODONE  Take 1-2 tablets (5-10 mg total) by mouth every 3 (three) hours as needed for moderate pain, severe pain or breakthrough pain.     rivaroxaban 10 MG Tabs tablet  Commonly known as:  XARELTO  - Take 1 tablet (10 mg total) by mouth daily with breakfast. Take  Xarelto for two and a half more weeks, then discontinue Xarelto.  - Once the patient has completed the blood thinner regimen, then take a Baby 81 mg Aspirin daily for three more weeks.     traMADol 50 MG tablet  Commonly known as:  ULTRAM  Take 1-2 tablets (50-100 mg total) by mouth every 4 (four) hours as needed (mild to moderate pain).       Follow-up Information   Follow up with Gearlean Alf, MD. Schedule an appointment as soon as possible for a visit in 2 weeks.   Specialty:  Orthopedic Surgery   Contact information:   [address] [place] [address] [phone]       Signed: Mickel Crow 11/14/2013, 10:27 AM

## 2013-10-23 NOTE — Progress Notes (Signed)
Physical Therapy Treatment Patient Details Name: LIZZIE AN MRN: [id number] DOB: Dec 18, 1950 Today's Date: 10/23/2013    History of Present Illness s/p R TKA; PMHx: HTN,  bil CTR    PT Comments    POD # 2 pm session. Pt less groggy and safe to D/C to home with spouse.  Follow Up Recommendations        Equipment Recommendations       Recommendations for Other Services       Precautions / Restrictions      Mobility  Bed Mobility  Pt OOB in recliner                Transfers  MinGuard out of recliner and one VC on proper hand placement with sit to stand at MinGuard assist.  demon and instructed on tub transfer                Ambulation/Gait  75 feet at MinGuard/Supervision RW with spouse instructed on safe handling               Stairs  up/down one step forward with RW with spouse and <25% VC's on proper sequencing and safety          Wheelchair Mobility    Modified Rankin (Stroke Patients Only)       Balance                                    Cognition                            Exercises      General Comments        Pertinent Vitals/Pain     Home Living                      Prior Function            PT Goals (current goals can now be found in the care plan section)      Frequency       PT Plan      Co-evaluation             End of Session           Time: 1419-1445 PT Time Calculation (min): 26 min  Charges:      G Codes:      Rica Koyanagi  PTA [facility]  Acute  Rehab Pager      [phone]

## 2013-10-23 NOTE — Progress Notes (Signed)
   Subjective: 2 Days Post-Op Procedure(s) (LRB): RIGHT TOTAL KNEE ARTHROPLASTY (Right) Patient reports pain as mild.   Patient seen in rounds for Dr. Wynelle Link. Family in room. Patient is well, and has had no acute complaints or problems Patient is ready to go home after the afternoon therapy session.  Objective: Vital signs in last 24 hours: Temp:  [98 F (36.7 C)-98.4 F (36.9 C)] 98 F (36.7 C) (04/22 0730) Pulse Rate:  [69-88] 88 (04/22 0815) Resp:  [18] 18 (04/22 0730) BP: (133-151)/(74-79) 133/75 mmHg (04/22 0815) SpO2:  [95 %-99 %] 99 % (04/22 0730)  Intake/Output from previous day:  Intake/Output Summary (Last 24 hours) at 10/23/13 1210 Last data filed at 10/23/13 0941  Gross per 24 hour  Intake    480 ml  Output   2400 ml  Net  -1920 ml    Intake/Output this shift: Total I/O In: 240 [P.O.:240] Out: 600 [Urine:600]  Labs:  Recent Labs  10/22/13 0357 10/23/13 0402  HGB 9.9* 9.3*    Recent Labs  10/22/13 0357 10/23/13 0402  WBC 7.7 7.7  RBC 3.47* 3.38*  HCT 29.9* 29.5*  PLT 209 188    Recent Labs  10/22/13 0357 10/23/13 0402  NA 138 136*  K 4.2 3.7  CL 103 100  CO2 26 27  BUN 11 13  CREATININE 0.66 0.63  GLUCOSE 113* 92  CALCIUM 8.3* 8.6   No results found for this basename: LABPT, INR,  in the last 72 hours  EXAM: General - Patient is Alert, Appropriate and Oriented Extremity - Neurovascular intact Sensation intact distally Dorsiflexion/Plantar flexion intact Incision - clean, dry, no drainage, healing Motor Function - intact, moving foot and toes well on exam.   Assessment/Plan: 2 Days Post-Op Procedure(s) (LRB): RIGHT TOTAL KNEE ARTHROPLASTY (Right) Procedure(s) (LRB): RIGHT TOTAL KNEE ARTHROPLASTY (Right) Past Medical History  Diagnosis Date  . HTN (hypertension)   . Acid reflux   . Urticaria   . Tinnitus   . Varicose veins   . Hemorrhoids   . Transfusion history 1978    after childbirth - not all of placenta passed  and pt started bleeding  . Hyperlipidemia   . Uterine fibroid     no problems from the fibroids  . Arthritis     oa and pain both knees  . Cyst in hand     ?? CYST BACK OF LEFT HAND - PT STATES THE AREA POPPED UP - RAISED AREA - SOMETIMES PAINFUL - HAS A BRACE TO WEAR ON THAT HAND WHEN SLEEPING  . Atypical chest pain 5 / 2014    DISCHARGE SUMMARY [place] - RULLED OUT FOR MI, NEGATIVE NUCLEAR STRESS TEST - PAIN ATTRIBUTED TO PT'S GERD- PT STATES NO FURTHER CHEST PAINS - [place] HAS HELPED PAIN   Principal Problem:   OA (osteoarthritis) of knee Active Problems:   Postoperative anemia due to acute blood loss  Estimated body mass index is 35.61 kg/(m^2) as calculated from the following:   Height as of this encounter: 5\' 5"  (1.651 m).   Weight as of this encounter: 97.07 kg (214 lb). Up with therapy Discharge home with home health Diet - Cardiac diet Follow up - in 2 weeks Activity - WBAT Disposition - Home Condition Upon Discharge - Good D/C Meds - See DC Summary DVT Prophylaxis - Xarelto  Sreenidhi Ganson 10/23/2013, 12:10 PM

## 2013-10-23 NOTE — Plan of Care (Signed)
Problem: Consults Goal: Diagnosis- Total Joint Replacement Outcome: Completed/Met Date Met:  10/23/13 Primary Total Knee RIGHT  Problem: Phase III Progression Outcomes Goal: Anticoagulant follow-up in place Outcome: Not Applicable Date Met:  49/25/24 Xarelto for VTE,  No f/u needed.  Problem: Discharge Progression Outcomes Goal: Anticoagulant follow-up in place Outcome: Not Applicable Date Met:  15/90/17 Xarelto for VTE, no f/u needed.

## 2013-10-24 NOTE — Progress Notes (Signed)
Discharge summary sent to payer through [facility]  

## 2013-11-11 ENCOUNTER — Ambulatory Visit (HOSPITAL_COMMUNITY)
Admission: RE | Admit: 2013-11-11 | Discharge: 2013-11-11 | Disposition: A | Discharge status: Home / Self Care | Payer: PRIVATE HEALTH INSURANCE | Source: Ambulatory Visit | Attending: Orthopedic Surgery | Admitting: Orthopedic Surgery

## 2013-11-11 DIAGNOSIS — R600 Localized edema: Secondary | ICD-10-CM | POA: Insufficient documentation

## 2013-11-11 DIAGNOSIS — M25569 Pain in unspecified knee: Secondary | ICD-10-CM | POA: Insufficient documentation

## 2013-11-11 DIAGNOSIS — R262 Difficulty in walking, not elsewhere classified: Secondary | ICD-10-CM | POA: Insufficient documentation

## 2013-11-11 DIAGNOSIS — M25469 Effusion, unspecified knee: Secondary | ICD-10-CM | POA: Insufficient documentation

## 2013-11-11 DIAGNOSIS — I10 Essential (primary) hypertension: Secondary | ICD-10-CM | POA: Insufficient documentation

## 2013-11-11 DIAGNOSIS — M25669 Stiffness of unspecified knee, not elsewhere classified: Secondary | ICD-10-CM | POA: Insufficient documentation

## 2013-11-11 DIAGNOSIS — IMO0001 Reserved for inherently not codable concepts without codable children: Secondary | ICD-10-CM | POA: Insufficient documentation

## 2013-11-11 NOTE — Evaluation (Signed)
Physical Therapy Evaluation  Patient Details  Name: Susan Oneill MRN: [id number] Date of Birth: [birth year]  Today's Date: 11/11/2013 Time:  37- 1238  charge:  Evaluation:  There ex 1225-1235              Visit#: 1 of 12  Re-eval: 12/11/13 Assessment Diagnosis: Rt TKR Surgical Date: 10/21/13 Next MD Visit: 12/07/2012 Prior Therapy: HH  Authorization: [employer]    Past Medical History:  Past Medical History  Diagnosis Date  . HTN (hypertension)   . Acid reflux   . Urticaria   . Tinnitus   . Varicose veins   . Hemorrhoids   . Transfusion history 1978    after childbirth - not all of placenta passed and pt started bleeding  . Hyperlipidemia   . Uterine fibroid     no problems from the fibroids  . Arthritis     oa and pain both knees  . Cyst in hand     ?? CYST BACK OF LEFT HAND - PT STATES THE AREA POPPED UP - RAISED AREA - SOMETIMES PAINFUL - HAS A BRACE TO WEAR ON THAT HAND WHEN SLEEPING  . Atypical chest pain 5 / 2014    DISCHARGE SUMMARY [place] - RULLED OUT FOR MI, NEGATIVE NUCLEAR STRESS TEST - PAIN ATTRIBUTED TO PT'S GERD- PT STATES NO FURTHER CHEST PAINS - [facility] HAS HELPED PAIN   Past Surgical History:  Past Surgical History  Procedure Laterality Date  . Knee surgery      bilateral knee arthroscopy- rt knee sept 2011, left knee march 2010  . Carpal tunnel release      bilateral  . Hemorroidectomy    . Hernia repair  3299    umbilical hernia repair  . Total knee arthroplasty Right 10/21/2013    Procedure: RIGHT TOTAL KNEE ARTHROPLASTY;  Surgeon: Gearlean Alf, MD;  Location: [facility];  Service: Orthopedics;  Laterality: Right;    Subjective Symptoms/Limitations Symptoms: [name] states that she opted to have a Rt TKR on 10/21/2013.  She was discharged to [facility] on 10/23/2013 and currently she is being referred to outpatient physcial therapy to maximize her functional ability. How long can you sit comfortably?: able to sit for an hour How long can you  stand comfortably?: able to stand for an hour How long can you walk comfortably?: pt is walking with a quad cane the longest she has walked for 30 minutes.   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; steps Leisure: Hobbies-yes (Comment) Comments: yard work   Cognition/Observation  Pt has increased swelling from ankle to thigh   Sensation/Coordination/Flexibility/Functional Tests Functional Tests Functional Tests: foto 51  Assessment RLE AROM (degrees) Right Knee Extension: 10 Right Knee Flexion: 95 RLE Strength Right Hip Flexion: 5/5 Right Hip Extension: 3-/5 Right Hip ABduction: 4/5 Right Knee Flexion: 5/5 Right Knee Extension: 5/5 Right Ankle Dorsiflexion: 4/5  Exercise/Treatments Mobility/Balance      Stretches Active Hamstring Stretch: 3 reps;30 seconds   Supine Quad Sets: 10 reps Heel Slides: 10 reps Bridges: 5 reps    Prone  Hamstring Curl: 10 reps  Manual Therapy Manual Therapy: Edema management Edema Management: Rt LE- decongestion techniques used on Rt LE to decreased swelling  Physical Therapy Assessment and Plan PT Assessment and Plan Clinical Impression Statement: Pt is a 63 yo female who had a TKR on 10/21/2013.  She is now being referred to out-patient PT to maximize her functional ability.  Pt main difficulty at this time appear to be swelling, ROM and nocturnal pain.  Pt will benefit from skilled OP PT to return pt to previous level of activity including full time employment.   Pt will benefit from skilled therapeutic intervention  in order to improve on the following deficits: Abnormal gait;Decreased balance;Difficulty walking;Decreased range of motion;Decreased strength;Pain;Increased edema;Increased fascial restricitons Rehab Potential: Good PT Frequency: Min 3X/week PT Duration: 4 weeks PT Treatment/Interventions: Gait training;Therapeutic activities;Therapeutic exercise;Manual techniques PT Plan: Pt to begin standing heel raise, toe raise, rocker board, knee flex , terminal extension and PROM     Goals Home Exercise Program Pt/caregiver will Perform Home Exercise Program: For increased ROM PT Goal: Perform Home Exercise Program - Progress: Goal set today PT Short Term Goals PT Short Term Goal 1: Pt pain to be no greater than a 3/10 80% of the time PT Short Term Goal 2: Pt to be waking only 2 times a night. PT Short Term Goal 3: Pt to be walking without an assistive device PT Long Term Goals Time to Complete Long Term Goals: 4 weeks PT Long Term Goal 1: ROM to be improved to 4-120  for normalized ambulation PT Long Term Goal 2: Pt to be able to sit for two hours for traveling. Long Term Goal 3: Pt to be sleeping throughout the night. Long Term Goal 4: Pt to be able to stay on her feet for 3 hours to prepare for RTW PT Long Term Goal 5: Pt to be walking for 30 minutes for better health habits.  Problem List Patient Active Problem List   Diagnosis Date Noted  . Stiffness of joint, not elsewhere classified, lower leg 11/11/2013  . Edema of right lower extremity 11/11/2013  . Difficulty in walking(719.7) 11/11/2013  . Postoperative anemia due to acute blood loss 10/22/2013  . OA (osteoarthritis) of knee 10/21/2013  . Hyperlipidemia 05/28/2013  . Idiopathic urticaria 05/28/2013  . GERD (gastroesophageal reflux disease) 11/28/2012  . HTN (hypertension)   . Acid reflux   . PVD (peripheral vascular disease)   . Bursitis of knee 12/02/2011  . KNEE PAIN 12/25/2008  . ANSERINE BURSITIS, LEFT 12/25/2008    PT  Plan of Care PT Home Exercise Plan: given  GP    Leeroy Cha 11/11/2013, 1:38 PM  Physician Documentation Your signature is required to indicate approval of the treatment plan as stated above.  Please sign and either send electronically or make a copy of this report for your files and return this physician signed original.   Please mark one 1.__approve of plan  2. ___approve of plan with the following conditions.   ______________________________                                                          _____________________ Physician Signature  Date  

## 2013-11-13 ENCOUNTER — Ambulatory Visit (HOSPITAL_COMMUNITY)
Admission: RE | Admit: 2013-11-13 | Discharge: 2013-11-13 | Disposition: A | Discharge status: Home / Self Care | Payer: PRIVATE HEALTH INSURANCE | Source: Ambulatory Visit | Attending: Orthopedic Surgery | Admitting: Orthopedic Surgery

## 2013-11-13 NOTE — Progress Notes (Signed)
Physical Therapy Treatment Patient Details  Name: Susan Oneill MRN: [id number] Date of Birth: September 16, 1950  Today's Date: 11/13/2013 Time: 1287-8676 PT Time Calculation (min): 53 min Charges: Therex x [phone]) Ice x 10'(1345-1355)  Visit#: 2 of 12  Re-eval: 12/11/13  Authorization: [employer]   Subjective: Symptoms/Limitations Symptoms: Pt reports HEP compliance. She also states that she is not waking as often at night. Pain Assessment Currently in Pain?: No/denies  Exercise/Treatments Standing Heel Raises: 10 reps;Limitations Heel Raises Limitations: Toe raises x 10 Knee Flexion: 10 reps Forward Lunges: 10 reps;Right;Limitations Forward Lunges Limitations: on 8" step Functional Squat: 10 reps SLS: 2x30" HHA as needed Supine Quad Sets: 10 reps Short Arc Quad Sets: 10 reps Heel Slides: 10 reps  Modalities Modalities: Cryotherapy Cryotherapy Number Minutes Cryotherapy: 10 Minutes Cryotherapy Location: Knee (Right) Type of Cryotherapy: Ice pack  Physical Therapy Assessment and Plan PT Assessment and Plan Clinical Impression Statement: Progressed LE strengthening with minimal difficulty after initial cueing and demo. Pt requires multimodal cueing to improve distal quad coordination with supine SAQ, TKE and quad sets. Pt also requires multimodal cueing to improve form with functional squats. PT appears to tolerate session well. Ice applied at end of session to limit pain and inflammation. Pt reports 0/10 at end of session. Pt will benefit from skilled therapeutic intervention in order to improve on the following deficits: Abnormal gait;Decreased balance;Difficulty walking;Decreased range of motion;Decreased strength;Pain;Increased edema;Increased fascial restricitons Rehab Potential: Good PT Frequency: Min 3X/week PT Duration: 4 weeks PT Treatment/Interventions: Gait training;Therapeutic activities;Therapeutic exercise;Manual techniques PT Plan: Continue to progress  LE strength and ROM per PT POC.    Goals    Problem List Patient Active Problem List   Diagnosis Date Noted  . Stiffness of joint, not elsewhere classified, lower leg 11/11/2013  . Edema of right lower extremity 11/11/2013  . Difficulty in walking(719.7) 11/11/2013  . Postoperative anemia due to acute blood loss 10/22/2013  . OA (osteoarthritis) of knee 10/21/2013  . Hyperlipidemia 05/28/2013  . Idiopathic urticaria 05/28/2013  . GERD (gastroesophageal reflux disease) 11/28/2012  . HTN (hypertension)   . Acid reflux   . PVD (peripheral vascular disease)   . Bursitis of knee 12/02/2011  . KNEE PAIN 12/25/2008  . ANSERINE BURSITIS, LEFT 12/25/2008    PT - End of Session Activity Tolerance: Patient tolerated treatment well General Behavior During Therapy: [facility] for tasks assessed/performed  Rachelle Hora, PTA  11/13/2013, 2:01 PM

## 2013-11-14 ENCOUNTER — Ambulatory Visit (HOSPITAL_COMMUNITY)
Admission: RE | Admit: 2013-11-14 | Discharge: 2013-11-14 | Disposition: A | Discharge status: Home / Self Care | Payer: PRIVATE HEALTH INSURANCE | Source: Ambulatory Visit | Attending: Orthopedic Surgery | Admitting: Orthopedic Surgery

## 2013-11-14 NOTE — Progress Notes (Signed)
Physical Therapy Treatment Patient Details  Name: Susan Oneill MRN: [id number] Date of Birth: 06-Dec-1950  Today's Date: 11/14/2013 Time: 6945-0388 PT Time Calculation (min): 46 min Charges:  therex 8280-0349 (24'), manual 1791-5056 (12')  Visit#: 3 of 12  Re-eval: 12/11/13 Assessment Diagnosis: Rt TKR Surgical Date: 10/21/13 Next MD Visit: 12/07/2012 Prior Therapy: HH Authorization: [employer]cost   Subjective: Symptoms/Limitations Symptoms: Pt reports really no pain just stiffness and soreness.  Reports complaince with HEP. Pain Assessment Currently in Pain?: No/denies  Objective: AROM today in supine:  5-100 (was 10-95 on 11/11/13)  Exercise/Treatments Aerobic Stationary Bike: 6' 3.0 seat 8 full revolutions Standing Heel Raises: 10 reps;Limitations Heel Raises Limitations: Toe raises x 10 Knee Flexion: 10 reps Forward Lunges: 10 reps;Right;Limitations Forward Lunges Limitations: on 14" step Rocker Board: 2 minutes;Limitations Rocker Board Limitations: R/L A/P Supine Quad Sets: 15 reps Heel Slides: 10 reps Prone  Hip Extension: 10 reps Prone Knee Hang: Limitations Prone Knee Hang Limitations: prone with pillow under thigh, myofascial techniques to posterior knee   Manual Therapy Manual Therapy: Other (comment) Other Manual Therapy: Myofascial techniques to anterior and posterior Rt knee; retro massage to Rt knee in supine with elevation  Physical Therapy Assessment and Plan PT Assessment and Plan Clinical Impression Statement: Added manual techniques in prone to posterior knee with noted tightness/adhesions.  Continued with myofascial to anterior knee f/b retro massage to decrease swelling.  Noted loosening of tissues anteriorly.  Progressed with standing exercises with multimodal cueing to improve form with functional squats. PT appears to tolerate session well.  Pt instructed to perform manual techniques and apply ice at home to limit pain and inflammation. Pt  reports 0/10 at end of session PT Plan: Continue to progress LE strength and ROM per PT POC.     Problem List Patient Active Problem List   Diagnosis Date Noted  . Stiffness of joint, not elsewhere classified, lower leg 11/11/2013  . Edema of right lower extremity 11/11/2013  . Difficulty in walking(719.7) 11/11/2013  . Postoperative anemia due to acute blood loss 10/22/2013  . OA (osteoarthritis) of knee 10/21/2013  . Hyperlipidemia 05/28/2013  . Idiopathic urticaria 05/28/2013  . GERD (gastroesophageal reflux disease) 11/28/2012  . HTN (hypertension)   . Acid reflux   . PVD (peripheral vascular disease)   . Bursitis of knee 12/02/2011  . KNEE PAIN 12/25/2008  . ANSERINE BURSITIS, LEFT 12/25/2008    PT - End of Session Activity Tolerance: Patient tolerated treatment well General Behavior During Therapy: WFL for tasks assessed/performed   Teena Irani, PTA/CLT 11/14/2013, 2:51 PM

## 2013-11-18 ENCOUNTER — Ambulatory Visit (HOSPITAL_COMMUNITY)
Admission: RE | Admit: 2013-11-18 | Discharge: 2013-11-18 | Disposition: A | Discharge status: Home / Self Care | Payer: PRIVATE HEALTH INSURANCE | Source: Ambulatory Visit | Attending: Orthopedic Surgery | Admitting: Orthopedic Surgery

## 2013-11-18 NOTE — Progress Notes (Signed)
Physical Therapy Treatment Patient Details  Name: Susan Oneill MRN: [id number] Date of Birth: 28-May-1951  Today's Date: 11/18/2013 Time: 1515-1600 PT Time Calculation (min): 45 min  Visit#: 4 of 12  Re-eval: 12/11/13 Authorization: [employer]  Charges:  therex 6811-5726 (32'), manual 1548-1600 (12')  Subjective: Symptoms/Limitations Symptoms: No pain today only stiffness in the Rt knee   Exercise/Treatments Aerobic Stationary Bike: 8' 3.0 seat 8 full revolutions Standing Heel Raises: 15 reps Heel Raises Limitations: Toe raises x 15 Knee Flexion: 15 reps Forward Lunges: 15 reps Forward Lunges Limitations: on 14" step Lateral Step Up: 10 reps;Step Height: 4";Hand Hold: 1;Right Forward Step Up: 10 reps;Step Height: 4";Right;Hand Hold: 1 Rocker Board: 2 minutes;Limitations Rocker Board Limitations: R/L A/P Supine Quad Sets: 15 reps Heel Slides: 10 reps Prone  Hip Extension: 10 reps Prone Knee Hang: Limitations Prone Knee Hang Limitations: prone with pillow under thigh, myofacial techniques to posterior knee   Manual Therapy Manual Therapy: Other (comment) Other Manual Therapy: Myofascial techniques to anterior and posterior Rt knee; retro massage to Rt knee in supine with elevation  Physical Therapy Assessment and Plan PT Assessment and Plan Clinical Impression Statement: Added forward and lateral step ups today to progress to goal of reciprocal steps.  Pt able to complete without c/o pain.  Noted improvement in ROM and gait today.  Manual techniques contributing to increase in ROM and decreased pain.   PT Plan: Continue to progress LE strength and ROM per PT POC.     Problem List Patient Active Problem List   Diagnosis Date Noted  . Stiffness of joint, not elsewhere classified, lower leg 11/11/2013  . Edema of right lower extremity 11/11/2013  . Difficulty in walking(719.7) 11/11/2013  . Postoperative anemia due to acute blood loss 10/22/2013  . OA  (osteoarthritis) of knee 10/21/2013  . Hyperlipidemia 05/28/2013  . Idiopathic urticaria 05/28/2013  . GERD (gastroesophageal reflux disease) 11/28/2012  . HTN (hypertension)   . Acid reflux   . PVD (peripheral vascular disease)   . Bursitis of knee 12/02/2011  . KNEE PAIN 12/25/2008  . ANSERINE BURSITIS, LEFT 12/25/2008    PT - End of Session Activity Tolerance: Patient tolerated treatment well General Behavior During Therapy: WFL for tasks assessed/performed   Teena Irani, PTA/CLT 11/18/2013, 3:54 PM

## 2013-11-20 ENCOUNTER — Ambulatory Visit (HOSPITAL_COMMUNITY)
Admission: RE | Admit: 2013-11-20 | Discharge: 2013-11-20 | Disposition: A | Discharge status: Home / Self Care | Payer: PRIVATE HEALTH INSURANCE | Source: Ambulatory Visit | Attending: Family Medicine | Admitting: Family Medicine

## 2013-11-20 NOTE — Progress Notes (Addendum)
Physical Therapy Treatment Patient Details  Name: Susan Oneill MRN: [id number] Date of Birth: 1950/11/24  Today's Date: 11/20/2013 Time: 1302-1402 PT Time Calculation (min): 60 min Charges: Therex x 42'(6834-1962) Manual x 22'(9798-9211) Ice x 10'(1352-1402)  Visit#: 5 of 12  Re-eval: 12/11/13  Authorization: [employer]   Subjective: Symptoms/Limitations Symptoms: Pt states that she made two pies and a cake this morning which required a lot of standing. She took breaks but feels like she tolerates the activitiy well.  Pain Assessment Currently in Pain?: No/denies   Exercise/Treatments Stretches Gastroc Stretch: 5 reps;10 seconds;Limitations Gastroc Stretch Limitations: on slant board Aerobic Stationary Bike: 8' 3.0 seat 7 full revolutions Standing Lateral Step Up: 10 reps;Step Height: 4";Hand Hold: 1;Right Forward Step Up: 10 reps;Step Height: 4";Right;Hand Hold: 1 Functional Squat: 10 reps;Limitations Functional Squat Limitations: 3D hip excursion Rocker Board: 2 minutes;Limitations Rocker Board Limitations: R/L A/P Supine Quad Sets: 10 reps Short Arc Quad Sets: 10 reps Terminal Knee Extension: 10 reps  Manual Therapy Other Manual Therapy: Myofascial techniques to anterior and posterior Rt knee; retro massage to Rt knee in supine with elevation Cryotherapy Number Minutes Cryotherapy: 10 Minutes Cryotherapy Location: Knee (Right) Type of Cryotherapy: Ice pack  Physical Therapy Assessment and Plan PT Assessment and Plan Clinical Impression Statement: PTA facilitated exercises to improve functional strength, stability and ROM. Pt requires multimodal cueing to improve form with functional squats and step ups. Pt appears to lack eccentric quad control. Pt also requires manual facilitation to improve distal quad coordination with supine SAQs, TKEs and quad sets. Ice applied at end of session to limit pain and inflammation. PT Plan: Continue to progress LE strength and  ROM per PT POC.    Problem List Patient Active Problem List   Diagnosis Date Noted  . Stiffness of joint, not elsewhere classified, lower leg 11/11/2013  . Edema of right lower extremity 11/11/2013  . Difficulty in walking(719.7) 11/11/2013  . Postoperative anemia due to acute blood loss 10/22/2013  . OA (osteoarthritis) of knee 10/21/2013  . Hyperlipidemia 05/28/2013  . Idiopathic urticaria 05/28/2013  . GERD (gastroesophageal reflux disease) 11/28/2012  . HTN (hypertension)   . Acid reflux   . PVD (peripheral vascular disease)   . Bursitis of knee 12/02/2011  . KNEE PAIN 12/25/2008  . ANSERINE BURSITIS, LEFT 12/25/2008    PT - End of Session Activity Tolerance: Patient tolerated treatment well General Behavior During Therapy: [facility] for tasks assessed/performed  Rachelle Hora, PTA  11/20/2013, 3:13 PM

## 2013-11-21 ENCOUNTER — Ambulatory Visit (HOSPITAL_COMMUNITY)
Admission: RE | Admit: 2013-11-21 | Discharge: 2013-11-21 | Disposition: A | Discharge status: Home / Self Care | Payer: PRIVATE HEALTH INSURANCE | Source: Ambulatory Visit | Attending: Orthopedic Surgery | Admitting: Orthopedic Surgery

## 2013-11-21 NOTE — Progress Notes (Signed)
Physical Therapy Treatment Patient Details  Name: Susan Oneill MRN: [id number] Date of Birth: Aug 05, 1950  Today's Date: 11/21/2013 Time: 1304-1400 PT Time Calculation (min): 56 min Visit#: 6 of 12  Re-eval: 12/11/13 Authorization: [employer]  Charges:  therex 5188-4166 (26'), manual 0630-1601 (12'), icepack 1345-1355 (10')  Subjective: Symptoms/Limitations Symptoms: Pt states she slept better last night than she has lately.  States she currently is without pain. Pain Assessment Currently in Pain?: No/denies   Exercise/Treatments Stretches Gastroc Stretch: 5 reps;10 seconds;Limitations Gastroc Stretch Limitations: on slant board Aerobic Stationary Bike: 8' 3.0 seat 7 full revolutions Standing Heel Raises: 15 reps Heel Raises Limitations: no UE's Toe raises x 15 Forward Lunges: 15 reps Forward Lunges Limitations: on 8" step Lateral Step Up: 10 reps;Hand Hold: 1;Right Forward Step Up: 10 reps;Step Height: 4";Right;Hand Hold: 1 Functional Squat Limitations: 3D hip excursion Rocker Board: 2 minutes;Limitations Rocker Board Limitations: R/L A/P   Manual Therapy Manual Therapy: Other (comment) Other Manual Therapy: Myofascial techniques to anterior/lateral Rt knee; retro massage to Rt knee in supine with elevation Cryotherapy Cryotherapy Location: Knee (Right)  Physical Therapy Assessment and Plan PT Assessment and Plan Clinical Impression Statement: PTA facilitated exercises to improve functional strength, stability and ROM. Progressed to 6" step ups and 8" lunge without difficulty but with noted weakness.   Continued with manual techniques to loosen adhesions.  Especially tight across anterior knee with full flexion.   Ice applied at end of session to limit pain and inflammation PT Plan: Continue to progress LE strength and ROM per PT POC.     Problem List Patient Active Problem List   Diagnosis Date Noted  . Stiffness of joint, not elsewhere classified, lower leg  11/11/2013  . Edema of right lower extremity 11/11/2013  . Difficulty in walking(719.7) 11/11/2013  . Postoperative anemia due to acute blood loss 10/22/2013  . OA (osteoarthritis) of knee 10/21/2013  . Hyperlipidemia 05/28/2013  . Idiopathic urticaria 05/28/2013  . GERD (gastroesophageal reflux disease) 11/28/2012  . HTN (hypertension)   . Acid reflux   . PVD (peripheral vascular disease)   . Bursitis of knee 12/02/2011  . KNEE PAIN 12/25/2008  . ANSERINE BURSITIS, LEFT 12/25/2008    PT - End of Session Activity Tolerance: Patient tolerated treatment well General Behavior During Therapy: WFL for tasks assessed/performed   Teena Irani, PTA/CLT 11/21/2013, 2:50 PM

## 2013-11-26 ENCOUNTER — Ambulatory Visit (HOSPITAL_COMMUNITY)
Admission: RE | Admit: 2013-11-26 | Discharge: 2013-11-26 | Disposition: A | Discharge status: Home / Self Care | Payer: PRIVATE HEALTH INSURANCE | Source: Ambulatory Visit | Attending: Orthopedic Surgery | Admitting: Orthopedic Surgery

## 2013-11-26 NOTE — Progress Notes (Signed)
Physical Therapy Treatment Patient Details  Name: Susan Oneill MRN: [id number] Date of Birth: [birth year]  Today's Date: 11/26/2013 Time: 1300-1402 PT Time Calculation (min): 62 min  Visit#: 7 of 12  Re-eval: 12/11/13 Authorization: [employer]  Charges:  therex 2637-8588 (35'), manual 5027-7412 (15'), icepack 1352-1402 (10')  Subjective: Symptoms/Limitations Symptoms: Pt states she has no pain just discomfort Rt medial inferior knee.  States she's been active and doing her HEP. Pain Assessment Currently in Pain?: No/denies   Exercise/Treatments Stretches Gastroc Stretch: Limitations Press photographer Limitations: 10 X 5"  Aerobic Stationary Bike: 10' 3.0 seat 7 full revolutions Standing Heel Raises: 15 reps Heel Raises Limitations: no UE's Toe raises x 15 Forward Lunges: 15 reps Forward Lunges Limitations: on 8" step Lateral Step Up: 10 reps;Step Height: 6";Hand Hold: 1;Right Forward Step Up: 10 reps;Right;Hand Hold: 1;Step Height: 6" Functional Squat: 10 reps;Limitations Functional Squat Limitations: 3D hip excursion Supine Quad Sets: 10 reps Short Arc Quad Sets: 10 reps   Manual Therapy Manual Therapy: Other (comment) Other Manual Therapy: Myofascial techniques to anterior medial and lateral Rt knee; retro massage to Rt knee in supine with elevation  Physical Therapy Assessment and Plan PT Assessment and Plan Clinical Impression Statement: Progressed to 6" step today with 1 UE use without c/o pain or difficulty.  Pt required therapist facilitation to perform gastroc stretch without forward flexion of trunk.  Noted improvement in ROM with patient reporting compliance with HEP. Overall reduction of adhesive tissue surrounding knee with manual techniques.   PT Plan: Continue to progress LE strength and ROM per PT POC.  Complete progress note prior to MD appointment on 6/2.      Problem List Patient Active Problem List   Diagnosis Date Noted  . Stiffness of joint,  not elsewhere classified, lower leg 11/11/2013  . Edema of right lower extremity 11/11/2013  . Difficulty in walking(719.7) 11/11/2013  . Postoperative anemia due to acute blood loss 10/22/2013  . OA (osteoarthritis) of knee 10/21/2013  . Hyperlipidemia 05/28/2013  . Idiopathic urticaria 05/28/2013  . GERD (gastroesophageal reflux disease) 11/28/2012  . HTN (hypertension)   . Acid reflux   . PVD (peripheral vascular disease)   . Bursitis of knee 12/02/2011  . KNEE PAIN 12/25/2008  . ANSERINE BURSITIS, LEFT 12/25/2008    PT - End of Session Activity Tolerance: Patient tolerated treatment well General Behavior During Therapy: WFL for tasks assessed/performed   Teena Irani, PTA/CLT 11/26/2013, 2:14 PM

## 2013-11-28 ENCOUNTER — Ambulatory Visit (HOSPITAL_COMMUNITY)
Admission: RE | Admit: 2013-11-28 | Discharge: 2013-11-28 | Disposition: A | Discharge status: Home / Self Care | Payer: PRIVATE HEALTH INSURANCE | Source: Ambulatory Visit | Attending: Orthopedic Surgery | Admitting: Orthopedic Surgery

## 2013-11-28 NOTE — Progress Notes (Signed)
Physical Therapy Treatment Patient Details  Name: Susan Oneill MRN: [id number] Date of Birth: [birth year]  Today's Date: 11/28/2013 Time: 1300-1356 PT Time Calculation (min): 56 min  Visit#: 8 of 12  Re-eval: 12/11/13 Diagnosis: Rt TKR Surgical Date: 10/21/13 Next MD Visit: 12/07/2012 Prior Therapy: HH Authorization: [employer]  Charges:  therex 6712-4580 (36'), manual 9983-3825 (8'), ice 1346-1356 (10')  Subjective: Symptoms/Limitations Symptoms: Pt states very little pain today, 1/10.  Pain Assessment Currently in Pain?: Yes Pain Score: 1  Pain Location: Knee Pain Orientation: Right   Exercise/Treatments Stretches Gastroc Stretch: Limitations Gastroc Stretch Limitations: 10 X 5"  Aerobic Stationary Bike: 10' 3.0 seat 7 full revolutions Standing Heel Raises: 15 reps Heel Raises Limitations: no UE's Toe raises x 15 Forward Lunges: 10 reps Forward Lunges Limitations: on 6" step Lateral Step Up: 10 reps;Step Height: 6";Hand Hold: 1;Right Forward Step Up: 10 reps;Right;Hand Hold: 1;Step Height: 6" Step Down: 10 reps Functional Squat: 10 reps;Limitations Functional Squat Limitations: 3D hip excursion Rocker Board: 2 minutes;Limitations Rocker Board Limitations: R/L A/P Supine Quad Sets: 15 reps Short Arc Quad Sets: 15 reps   Manual Therapy Manual Therapy: Other (comment) Other Manual Therapy: Myofascial techniques to anterior medial and lateral Rt knee; retro massage to Rt knee in supine with elevation  Physical Therapy Assessment and Plan PT Assessment and Plan Clinical Impression Statement: Added forward step downs to work on eccentric quad strength and functional stability.  Pt able to complete with very little noted weakness.  Overall increased mobility of hips as demonstrated with hip excursion activity.  AROM in supine today 5-112 (was 5-100 on 5/14).  Pt reports able to complete all functional tasks and ADL's without difficulty.  Only with slight difficulty  negotiating stairs.  Continues to require more work wtih gait quality.   PT Plan: Continue to progress LE strength and ROM per PT POC.  Complete progress note prior to MD appointment on 6/2.      Problem List Patient Active Problem List   Diagnosis Date Noted  . Stiffness of joint, not elsewhere classified, lower leg 11/11/2013  . Edema of right lower extremity 11/11/2013  . Difficulty in walking(719.7) 11/11/2013  . Postoperative anemia due to acute blood loss 10/22/2013  . OA (osteoarthritis) of knee 10/21/2013  . Hyperlipidemia 05/28/2013  . Idiopathic urticaria 05/28/2013  . GERD (gastroesophageal reflux disease) 11/28/2012  . HTN (hypertension)   . Acid reflux   . PVD (peripheral vascular disease)   . Bursitis of knee 12/02/2011  . KNEE PAIN 12/25/2008  . ANSERINE BURSITIS, LEFT 12/25/2008    PT - End of Session Activity Tolerance: Patient tolerated treatment well General Behavior During Therapy: WFL for tasks assessed/performed   Teena Irani, PTA/CLT 11/28/2013, 1:50 PM

## 2013-11-29 ENCOUNTER — Ambulatory Visit (HOSPITAL_COMMUNITY)
Admission: RE | Admit: 2013-11-29 | Discharge: 2013-11-29 | Disposition: A | Discharge status: Home / Self Care | Payer: PRIVATE HEALTH INSURANCE | Source: Ambulatory Visit | Attending: Orthopedic Surgery | Admitting: Orthopedic Surgery

## 2013-11-29 DIAGNOSIS — R262 Difficulty in walking, not elsewhere classified: Secondary | ICD-10-CM

## 2013-11-29 DIAGNOSIS — R600 Localized edema: Secondary | ICD-10-CM

## 2013-11-29 DIAGNOSIS — M25669 Stiffness of unspecified knee, not elsewhere classified: Secondary | ICD-10-CM

## 2013-11-29 NOTE — Evaluation (Signed)
Physical Therapy Evaluation  Patient Details  Name: Susan Oneill MRN: [id number] Date of Birth: May 06, 1951  Today's Date: 11/29/2013 Time: 1520-1600 PT Time Calculation (min): 40 min Charge:  MM test 8016-5537;  Manual 1532-1552; there ex 1552-1600             Visit#: 9 of 12  Re-eval: 12/13/13 Assessment Diagnosis: Rt TKR Surgical Date: 10/21/13 Next MD Visit: 12/07/2012 Prior Therapy: HH  Authorization: [employer]    Past Medical History:  Past Medical History  Diagnosis Date  . HTN (hypertension)   . Acid reflux   . Urticaria   . Tinnitus   . Varicose veins   . Hemorrhoids   . Transfusion history 1978    after childbirth - not all of placenta passed and pt started bleeding  . Hyperlipidemia   . Uterine fibroid     no problems from the fibroids  . Arthritis     oa and pain both knees  . Cyst in hand     ?? CYST BACK OF LEFT HAND - PT STATES THE AREA POPPED UP - RAISED AREA - SOMETIMES PAINFUL - HAS A BRACE TO WEAR ON THAT HAND WHEN SLEEPING  . Atypical chest pain 5 / 2014    DISCHARGE SUMMARY [place] - RULLED OUT FOR MI, NEGATIVE NUCLEAR STRESS TEST - PAIN ATTRIBUTED TO PT'S GERD- PT STATES NO FURTHER CHEST PAINS - [facility] HAS HELPED PAIN   Past Surgical History:  Past Surgical History  Procedure Laterality Date  . Knee surgery      bilateral knee arthroscopy- rt knee sept 2011, left knee march 2010  . Carpal tunnel release      bilateral  . Hemorroidectomy    . Hernia repair  4827    umbilical hernia repair  . Total knee arthroplasty Right 10/21/2013    Procedure: RIGHT TOTAL KNEE ARTHROPLASTY;  Surgeon: Gearlean Alf, MD;  Location: [facility];  Service: Orthopedics;  Laterality: Right;    Subjective Symptoms/Limitations Symptoms: Pt states that she is sore today. How long can you sit comfortably?: Pt is able to sit for an hour was an hour. How long can you stand comfortably?: able to stand for an hour and a half but she could tell this was too long. How long  can you walk comfortably?: Pt is no longer walking with her quad cane.  She is able to walk without an assistive device at least 30 minutes. Pain Assessment Currently in Pain?: Yes Pain Score: 1  Pain Location: Knee    Obervation:  Pt has decreased push off during gait prohibiting normal calf mm pump.   Assessment RLE AROM (degrees) Right Knee Extension: 5 (was 10) Right Knee Flexion: 112 (was 95) RLE Strength Right Hip Flexion: 5/5 Right Hip Extension:  (5-/5 was 5/5) Right Hip ABduction: 5/5 (was 4/5) Right Knee Flexion: 5/5 Right Knee Extension: 5/5 Right Ankle Dorsiflexion: 5/5 (was 4/5)  Exercise/Treatments      Stretches Active Hamstring Stretch: 3 reps;30 seconds Passive Hamstring Stretch: 1 rep;60 seconds   Supine Quad Sets: 10 reps Patellar Mobs: done  Knee Extension: PROM   Manual Therapy Manual Therapy: Edema management Edema Management: Decongestin techniques used on Rt LE from ankle to thigh to decrease swelling.  Physical Therapy Assessment and Plan PT Assessment and Plan Clinical Impression Statement: Pt doing very well with strength.  Pt current deficits include edema and improper gait.  Pt is not using normal heel toe gt.  Pt reassessed and will benefit  from continuted skilled PT but for the focusl to be on Gt as well as manual techniques to decreae edema. PT Frequency: Min 3X/week PT Duration:  (2 more weeks for a total of 5 weeks.) PT Treatment/Interventions: Gait training;Manual techniques;Modalities;Therapeutic exercise PT Plan: D/C strengthening activities focus on gaining ROM, decreasing edema and normalizing gt.    Goals Home Exercise Program Pt/caregiver will Perform Home Exercise Program: For increased ROM PT Goal: Perform Home Exercise Program - Progress: Met PT Short Term Goals PT Short Term Goal 1: Pt pain to be no greater than a 3/10 80% of the time PT Short Term Goal 1 - Progress: Met PT Short Term Goal 2: Pt to be waking only 2  times a night. PT Short Term Goal 2 - Progress: Met PT Short Term Goal 3: Pt to be walking without an assistive device PT Short Term Goal 3 - Progress: Met PT Long Term Goals PT Long Term Goal 1: ROM to be improved to 4-120  for normalized ambulation PT Long Term Goal 1 - Progress: Progressing toward goal PT Long Term Goal 2: Pt to be able to sit for two hours for traveling. PT Long Term Goal 2 - Progress: Progressing toward goal Long Term Goal 3: Pt to be sleeping throughout the night. Long Term Goal 3 Progress: Progressing toward goal Long Term Goal 4: Pt to be able to stay on her feet for 3 hours to prepare for RTW Long Term Goal 4 Progress: Progressing toward goal PT Long Term Goal 5: Pt to be walking for 30 minutes for better health habits. Long Term Goal 5 Progress: Met  Problem List Patient Active Problem List   Diagnosis Date Noted  . Stiffness of joint, not elsewhere classified, lower leg 11/11/2013  . Edema of right lower extremity 11/11/2013  . Difficulty in walking(719.7) 11/11/2013  . Postoperative anemia due to acute blood loss 10/22/2013  . OA (osteoarthritis) of knee 10/21/2013  . Hyperlipidemia 05/28/2013  . Idiopathic urticaria 05/28/2013  . GERD (gastroesophageal reflux disease) 11/28/2012  . HTN (hypertension)   . Acid reflux   . PVD (peripheral vascular disease)   . Bursitis of knee 12/02/2011  . KNEE PAIN 12/25/2008  . ANSERINE BURSITIS, LEFT 12/25/2008    PT Plan of Care PT Home Exercise Plan: for heel toe gt activities.   GP    Leeroy Cha 11/29/2013, 4:07 PM  Physician Documentation Your signature is required to indicate approval of the treatment plan as stated above.  Please sign and either send electronically or make a copy of this report for your files and return this physician signed original.   Please mark one 1.__approve of plan  2. ___approve of plan with the following conditions.   ______________________________                                                           _____________________ Physician Signature  Date  

## 2013-12-02 ENCOUNTER — Ambulatory Visit (HOSPITAL_COMMUNITY)
Admission: RE | Admit: 2013-12-02 | Discharge: 2013-12-02 | Disposition: A | Discharge status: Home / Self Care | Payer: PRIVATE HEALTH INSURANCE | Source: Ambulatory Visit | Attending: Family Medicine | Admitting: Family Medicine

## 2013-12-02 ENCOUNTER — Encounter: Payer: Self-pay | Admitting: Family Medicine

## 2013-12-02 ENCOUNTER — Ambulatory Visit (INDEPENDENT_AMBULATORY_CARE_PROVIDER_SITE_OTHER): Payer: PRIVATE HEALTH INSURANCE | Admitting: Family Medicine

## 2013-12-02 VITALS — BP 138/90 | Ht 65.0 in | Wt 212.0 lb

## 2013-12-02 DIAGNOSIS — F411 Generalized anxiety disorder: Secondary | ICD-10-CM

## 2013-12-02 DIAGNOSIS — I10 Essential (primary) hypertension: Secondary | ICD-10-CM

## 2013-12-02 DIAGNOSIS — M25669 Stiffness of unspecified knee, not elsewhere classified: Secondary | ICD-10-CM | POA: Insufficient documentation

## 2013-12-02 DIAGNOSIS — IMO0001 Reserved for inherently not codable concepts without codable children: Secondary | ICD-10-CM | POA: Insufficient documentation

## 2013-12-02 DIAGNOSIS — M25569 Pain in unspecified knee: Secondary | ICD-10-CM

## 2013-12-02 DIAGNOSIS — M25469 Effusion, unspecified knee: Secondary | ICD-10-CM | POA: Insufficient documentation

## 2013-12-02 DIAGNOSIS — R262 Difficulty in walking, not elsewhere classified: Secondary | ICD-10-CM | POA: Insufficient documentation

## 2013-12-02 MED ORDER — HYDROCODONE-ACETAMINOPHEN 5-325 MG PO TABS: 1.0000 | ORAL_TABLET | Freq: Four times a day (QID) | ORAL | Status: DC | PRN

## 2013-12-02 MED ORDER — ALPRAZOLAM 0.5 MG PO TABS: ORAL_TABLET | ORAL | Status: DC

## 2013-12-02 MED ORDER — TRAMADOL HCL 50 MG PO TABS: 50.0000 mg | ORAL_TABLET | ORAL | Status: DC | PRN

## 2013-12-02 NOTE — Progress Notes (Signed)
Physical Therapy Treatment Patient Details  Name: TIAH HECKEL MRN: [id number] Date of Birth: 1951/05/26  Today's Date: 12/02/2013 Time: 3810-1751 PT Time Calculation (min): 55 min  Visit#: 10 of 12  Re-eval: 12/13/13 Assessment Diagnosis: Rt TKR Surgical Date: 10/21/13 Next MD Visit: 12/03/13 Prior Therapy: HH Authorization: [employer]  Charges:  therex 1140-1215 (35'), manual 1216-1224, ice 1225-1235 (10')  Subjective: Symptoms/Limitations Symptoms: Pt reports minimal pain and soreness today. Pt reports pain going from knee to groin on inner thigh.   Pain Assessment Currently in Pain?: Yes Pain Score: 1  Pain Location: Knee Pain Orientation: Right   Exercise/Treatments Stretches Gastroc Stretch: Limitations Gastroc Stretch Limitations: 10 X 5"  Aerobic Stationary Bike: 10' 3.0 seat 7 full revolutions Standing SLS with Vectors: 8X5" on Rt LE Gait Training: normalized gait pattern 250 feet without AD Supine Quad Sets: 10 reps Short Arc Quad Sets: 15 reps Patellar Mobs: done  Knee Extension: PROM Knee Flexion: PROM   Modalities Modalities: Cryotherapy Manual Therapy Manual Therapy: Other (comment) Other Manual Therapy: Myofascial techniques to anterior medial and lateral Rt knee; retro massage to Rt knee in supine with elevation Cryotherapy Number Minutes Cryotherapy: 10 Minutes Cryotherapy Location: Knee Type of Cryotherapy: Ice pack  Physical Therapy Assessment and Plan PT Assessment and Plan Clinical Impression Statement: continued to focus on ROM and gait.  Pt able to ambulate without deficits when slows gait velocity and rolls heel to toe.  Retro massage  and ice at end of session to help decrease edema.   PT Plan: Continue focus on increasing ROM, decreasing edema and normalizing gt.     Problem List Patient Active Problem List   Diagnosis Date Noted  . Stiffness of joint, not elsewhere classified, lower leg 11/11/2013  . Edema of right lower  extremity 11/11/2013  . Difficulty in walking(719.7) 11/11/2013  . Postoperative anemia due to acute blood loss 10/22/2013  . OA (osteoarthritis) of knee 10/21/2013  . Hyperlipidemia 05/28/2013  . Idiopathic urticaria 05/28/2013  . GERD (gastroesophageal reflux disease) 11/28/2012  . HTN (hypertension)   . Acid reflux   . PVD (peripheral vascular disease)   . Bursitis of knee 12/02/2011  . KNEE PAIN 12/25/2008  . ANSERINE BURSITIS, LEFT 12/25/2008       Teena Irani, PTA/CLT 12/02/2013, 12:29 PM

## 2013-12-02 NOTE — Patient Instructions (Signed)
Hypertension  As your heart beats, it forces blood through your arteries. This force is your blood pressure. If the pressure is too high, it is called hypertension (HTN) or high blood pressure. HTN is dangerous because you may have it and not know it. High blood pressure may mean that your heart has to work harder to pump blood. Your arteries may be narrow or stiff. The extra work puts you at risk for heart disease, stroke, and other problems.   Blood pressure consists of two numbers, a higher number over a lower, 110/72, for example. It is stated as "110 over 72." The ideal is below 120 for the top number (systolic) and under 80 for the bottom (diastolic). Write down your blood pressure today.  You should pay close attention to your blood pressure if you have certain conditions such as:   Heart failure.   Prior heart attack.   Diabetes   Chronic kidney disease.   Prior stroke.   Multiple risk factors for heart disease.  To see if you have HTN, your blood pressure should be measured while you are seated with your arm held at the level of the heart. It should be measured at least twice. A one-time elevated blood pressure reading (especially in the Emergency Department) does not mean that you need treatment. There may be conditions in which the blood pressure is different between your right and left arms. It is important to see your caregiver soon for a recheck.  Most people have essential hypertension which means that there is not a specific cause. This type of high blood pressure may be lowered by changing lifestyle factors such as:   Stress.   Smoking.   Lack of exercise.   Excessive weight.   Drug/tobacco/alcohol use.   Eating less salt.  Most people do not have symptoms from high blood pressure until it has caused damage to the body. Effective treatment can often prevent, delay or reduce that damage.  TREATMENT    When a cause has been identified, treatment for high blood pressure is directed at the cause. There are a large number of medications to treat HTN. These fall into several categories, and your caregiver will help you select the medicines that are best for you. Medications may have side effects. You should review side effects with your caregiver.  If your blood pressure stays high after you have made lifestyle changes or started on medicines,    Your medication(s) may need to be changed.   Other problems may need to be addressed.   Be certain you understand your prescriptions, and know how and when to take your medicine.   Be sure to follow up with your caregiver within the time frame advised (usually within two weeks) to have your blood pressure rechecked and to review your medications.   If you are taking more than one medicine to lower your blood pressure, make sure you know how and at what times they should be taken. Taking two medicines at the same time can result in blood pressure that is too low.  SEEK IMMEDIATE MEDICAL CARE IF:   You develop a severe headache, blurred or changing vision, or confusion.   You have unusual weakness or numbness, or a faint feeling.   You have severe chest or abdominal pain, vomiting, or breathing problems.  MAKE SURE YOU:    Understand these instructions.   Will watch your condition.   Will get help right away if you are not doing well   or get worse.  Document Released: 06/20/2005 Document Revised: 09/12/2011 Document Reviewed: 02/08/2008  ExitCare Patient Information 2014 [employer]  DASH Diet   The DASH diet stands for "Dietary Approaches to Stop Hypertension." It is a healthy eating plan that has been shown to reduce high blood pressure (hypertension) in as little as 14 days, while also possibly providing other significant health benefits. These other health benefits include reducing the risk of breast cancer after menopause and reducing the risk of type 2 diabetes, heart disease, colon cancer, and stroke. Health benefits also include weight loss and slowing kidney failure in patients with chronic kidney disease.   DIET GUIDELINES   Limit salt (sodium). Your diet should contain less than 1500 mg of sodium daily.   Limit refined or processed carbohydrates. Your diet should include mostly whole grains. Desserts and added sugars should be used sparingly.   Include small amounts of heart-healthy fats. These types of fats include nuts, oils, and tub margarine. Limit saturated and trans fats. These fats have been shown to be harmful in the body.  CHOOSING FOODS   The following food groups are based on a 2000 calorie diet. See your Registered Dietitian for individual calorie needs.  Grains and Grain Products (6 to 8 servings daily)   Eat More Often: Whole-wheat bread, brown rice, whole-grain or wheat pasta, quinoa, popcorn without added fat or salt (air popped).   Eat Less Often: White bread, white pasta, white rice, cornbread.  Vegetables (4 to 5 servings daily)   Eat More Often: Fresh, frozen, and canned vegetables. Vegetables may be raw, steamed, roasted, or grilled with a minimal amount of fat.   Eat Less Often/Avoid: Creamed or fried vegetables. Vegetables in a cheese sauce.  Fruit (4 to 5 servings daily)   Eat More Often: All fresh, canned (in natural juice), or frozen fruits. Dried fruits without added sugar. One hundred percent fruit juice ( cup [237 mL] daily).   Eat Less Often: Dried fruits with added sugar. Canned fruit in light or heavy syrup.   Lean Meats, Fish, and Poultry (2 servings or less daily. One serving is 3 to 4 oz [85-114 g]).   Eat More Often: Ninety percent or leaner ground beef, tenderloin, sirloin. Round cuts of beef, chicken breast, turkey breast. All fish. Grill, bake, or broil your meat. Nothing should be fried.   Eat Less Often/Avoid: Fatty cuts of meat, turkey, or chicken leg, thigh, or wing. Fried cuts of meat or fish.  Dairy (2 to 3 servings)   Eat More Often: Low-fat or fat-free milk, low-fat plain or light yogurt, reduced-fat or part-skim cheese.   Eat Less Often/Avoid: Milk (whole, 2%).Whole milk yogurt. Full-fat cheeses.  Nuts, Seeds, and Legumes (4 to 5 servings per week)   Eat More Often: All without added salt.   Eat Less Often/Avoid: Salted nuts and seeds, canned beans with added salt.  Fats and Sweets (limited)   Eat More Often: Vegetable oils, tub margarines without trans fats, sugar-free gelatin. Mayonnaise and salad dressings.   Eat Less Often/Avoid: Coconut oils, palm oils, butter, stick margarine, cream, half and half, cookies, candy, pie.  FOR MORE INFORMATION  The Dash Diet Eating Plan: www.dashdiet.org  Document Released: 06/09/2011 Document Revised: 09/12/2011 Document Reviewed: 06/09/2011  [employer] Patient Information 2014 [employer]

## 2013-12-02 NOTE — Progress Notes (Signed)
   Subjective:    Patient ID: Susan Oneill, female    DOB: [birth year], 63 y.o.   MRN: [id number]  HPI Patient is here today for a check up.  She would like a refill on her meds. She states she's been using oxycodone for the pain but she doesn't feel she needs it any further she would like to try hydrocodone. She is taking this before in the past without trouble. She denies overusing it.  Pt would like to see if you can refill her Xanax. She states she uses this intermittently for anxiety related issues  Pt prefers hydrocodone vs oxycodone.   Had right knee replacement surgery on April 20.     Review of Systems     Objective:   Physical Exam  Her lungs are clear hearts regular pulse normal extremities no edema skin warm dry right knee gives her subjective discomfort      Assessment & Plan:  Right knee pain-switch from oxycodone to hydrocodone she is to followup if ongoing need for this medicine. Followup again in 3-4 months  HTN fair control currently watch diet minimize salt try to get more physically active recheck blood pressure on followup again in 3 months  Anxiety issues-prescription for Xanax given patient cautioned not to over use it. She denies being depressed. She will monitor if she starts having frequent days of feeling sad or blue she will followup

## 2013-12-04 ENCOUNTER — Ambulatory Visit (HOSPITAL_COMMUNITY): Payer: PRIVATE HEALTH INSURANCE

## 2013-12-04 ENCOUNTER — Telehealth (HOSPITAL_COMMUNITY): Payer: Self-pay

## 2013-12-09 ENCOUNTER — Ambulatory Visit (HOSPITAL_COMMUNITY): Payer: PRIVATE HEALTH INSURANCE

## 2013-12-11 ENCOUNTER — Ambulatory Visit (HOSPITAL_COMMUNITY): Payer: PRIVATE HEALTH INSURANCE

## 2013-12-16 ENCOUNTER — Ambulatory Visit (HOSPITAL_COMMUNITY): Payer: PRIVATE HEALTH INSURANCE | Admitting: Physical Therapy

## 2013-12-18 ENCOUNTER — Ambulatory Visit (HOSPITAL_COMMUNITY): Payer: PRIVATE HEALTH INSURANCE | Admitting: Physical Therapy

## 2013-12-20 ENCOUNTER — Ambulatory Visit (HOSPITAL_COMMUNITY): Payer: PRIVATE HEALTH INSURANCE | Admitting: Physical Therapy

## 2013-12-23 ENCOUNTER — Ambulatory Visit (HOSPITAL_COMMUNITY): Payer: PRIVATE HEALTH INSURANCE

## 2013-12-25 ENCOUNTER — Ambulatory Visit (HOSPITAL_COMMUNITY): Payer: PRIVATE HEALTH INSURANCE | Admitting: Physical Therapy

## 2013-12-27 ENCOUNTER — Ambulatory Visit (HOSPITAL_COMMUNITY): Payer: PRIVATE HEALTH INSURANCE | Admitting: Physical Therapy

## 2014-01-04 ENCOUNTER — Other Ambulatory Visit: Payer: Self-pay | Admitting: Family Medicine

## 2014-02-05 ENCOUNTER — Other Ambulatory Visit: Payer: Self-pay | Admitting: Family Medicine

## 2014-03-08 ENCOUNTER — Other Ambulatory Visit: Payer: Self-pay | Admitting: Family Medicine

## 2014-03-11 NOTE — Telephone Encounter (Signed)
May have this +3 additional refills on each medicine. She needs a followup office visit by December.

## 2014-03-12 MED ORDER — HYDROCHLOROTHIAZIDE 25 MG PO TABS: ORAL_TABLET | ORAL | Status: DC

## 2014-03-13 ENCOUNTER — Other Ambulatory Visit: Payer: Self-pay | Admitting: Family Medicine

## 2014-03-14 ENCOUNTER — Other Ambulatory Visit: Payer: Self-pay | Admitting: *Deleted

## 2014-03-14 MED ORDER — NAPROXEN 500 MG PO TABS: ORAL_TABLET | ORAL | Status: DC

## 2014-03-14 MED ORDER — HYDROCHLOROTHIAZIDE 25 MG PO TABS: ORAL_TABLET | ORAL | Status: DC

## 2014-04-11 ENCOUNTER — Telehealth: Payer: Self-pay | Admitting: *Deleted

## 2014-04-11 MED ORDER — HYDROCHLOROTHIAZIDE 25 MG PO TABS: ORAL_TABLET | ORAL | Status: DC

## 2014-04-11 NOTE — Telephone Encounter (Signed)
Rx sent electronically to pharmacy. Patient notified. 

## 2014-04-11 NOTE — Telephone Encounter (Signed)
Pt's husband called stating his wife went by the [facility] in [place] to get her rx it is her fluid pill, per husband pt's rx is not there. Please advise [phone].

## 2014-04-23 IMAGING — CT CT ANGIO CHEST
1 of 6 series · 5 of 36 positions shown · IV contrast (Omnipaque 300)
Comparison: Chest x-ray today

CLINICAL DATA: Chest pain

CT ANGIOGRAPHY CHEST
TECHNIQUE: Multidetector CT imaging of the chest using the
standard protocol during bolus administration of intravenous
contrast. Multiplanar reconstructed images including MIPs were
obtained and reviewed to evaluate the vascular anatomy.
Contrast: 100mL OMNIPAQUE IOHEXOL 350 MG/ML SOLN

[Series 4: pe 3.0 b40f · axial · 0.62mm/px · z∈[-260,-98]mm · 5 of 82 slices shown]
[im 14/82  lung]
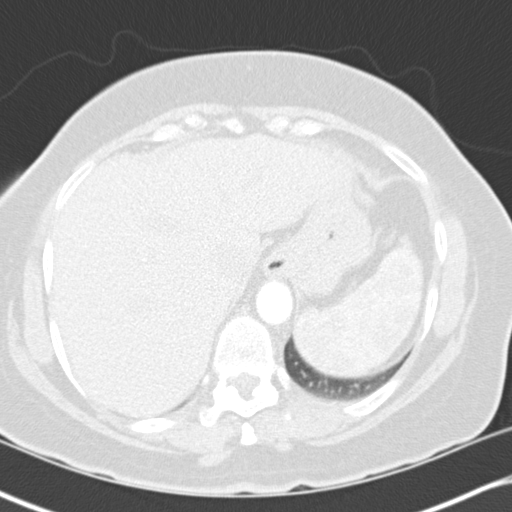
[im 28/82  mediastinal]
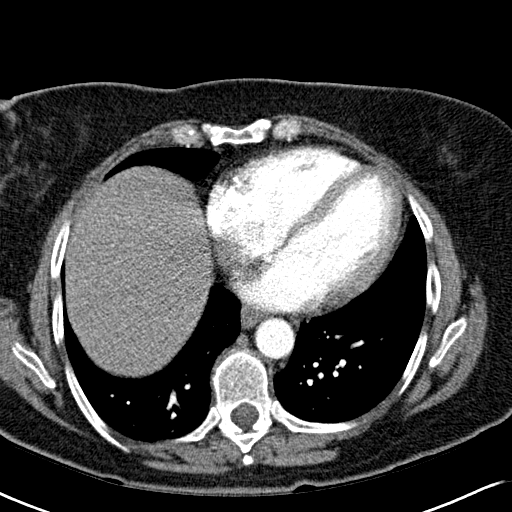
[im 41/82  lung]
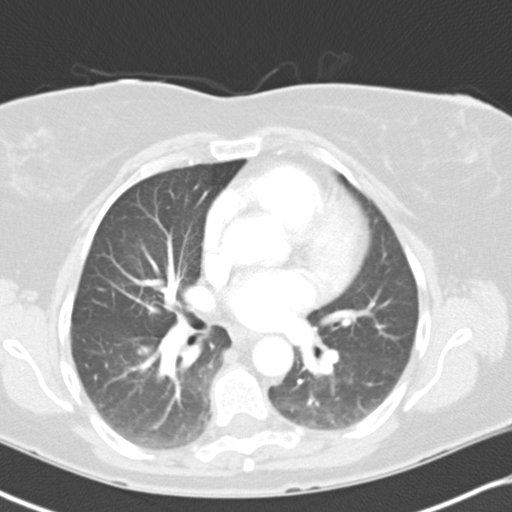
[im 55/82  mediastinal]
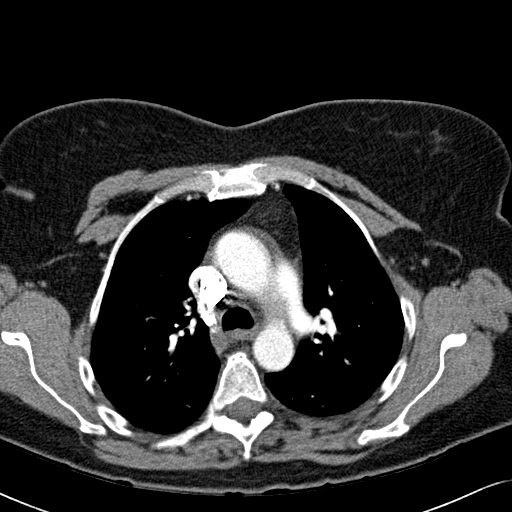
[im 68/82  lung]
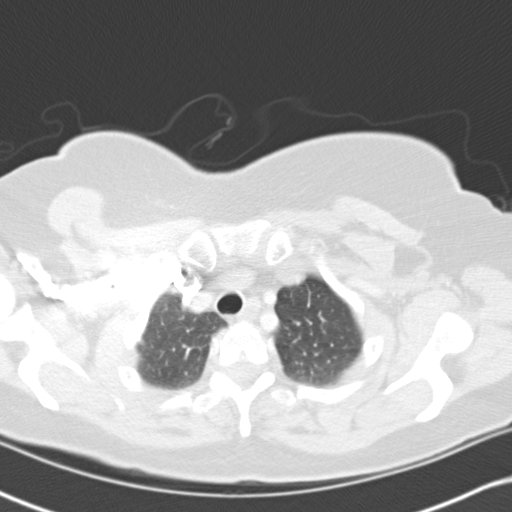

[5 of 36 positions shown; findings below may reference images not displayed]

FINDINGS: Negative for pulmonary embolism.  Aorta is normal in
caliber and  there is no aortic dissection.  Heart size is normal
and there is no pericardial effusion.  No coronary calcification is
identified.

Lungs are clear without infiltrate or effusion.  No mass or
adenopathy.  Mild degenerative change in the thoracic spine.
IMPRESSION: Negative for pulmonary embolism.  No acute abnormality.

## 2014-05-05 ENCOUNTER — Encounter: Payer: Self-pay | Admitting: Family Medicine

## 2014-05-05 ENCOUNTER — Other Ambulatory Visit: Payer: Self-pay | Admitting: Family Medicine

## 2014-05-05 NOTE — Telephone Encounter (Signed)
May have 1 refill 

## 2014-05-06 ENCOUNTER — Other Ambulatory Visit: Payer: Self-pay | Admitting: Family Medicine

## 2014-05-06 NOTE — Telephone Encounter (Signed)
Last seen 12/02/13

## 2014-05-06 NOTE — Telephone Encounter (Signed)
I sure thought that this was handled? Please see her medicine list. The date was for 11 2 yesterday

## 2014-05-08 NOTE — Telephone Encounter (Signed)
I thought this was done on November 2 but if not may have this +3 refills thank you if we have already faxed once that raises strongly into question did the pharmacy ever received the fax?

## 2014-05-19 ENCOUNTER — Encounter: Payer: Self-pay | Admitting: Family Medicine

## 2014-05-19 ENCOUNTER — Ambulatory Visit (INDEPENDENT_AMBULATORY_CARE_PROVIDER_SITE_OTHER): Payer: PRIVATE HEALTH INSURANCE | Admitting: Family Medicine

## 2014-05-19 VITALS — BP 166/94 | Ht 65.0 in | Wt 225.0 lb

## 2014-05-19 DIAGNOSIS — I10 Essential (primary) hypertension: Secondary | ICD-10-CM

## 2014-05-19 DIAGNOSIS — M25571 Pain in right ankle and joints of right foot: Secondary | ICD-10-CM

## 2014-05-19 MED ORDER — TRAMADOL HCL 50 MG PO TABS: 50.0000 mg | ORAL_TABLET | ORAL | Status: DC | PRN

## 2014-05-19 MED ORDER — HYDROCODONE-ACETAMINOPHEN 5-325 MG PO TABS: 1.0000 | ORAL_TABLET | Freq: Four times a day (QID) | ORAL | Status: DC | PRN

## 2014-05-19 NOTE — Progress Notes (Signed)
   Subjective:    Patient ID: Susan Oneill, female    DOB: 21-Apr-1951, 63 y.o.   MRN: [id number]  HPI Patient is here today for a check up.  She would like to go over her BW results.her lab work that was completed with her wellness with her workplace showed her cholesterol looks good sugar looks good kidney functions good  She would like a refill on her pain meds.   I talk with her at length about the importance of exercising watching diet.  Review of Systems  Constitutional: Negative for activity change, appetite change and fatigue.  HENT: Negative for congestion.   Respiratory: Negative for cough and shortness of breath.   Cardiovascular: Negative for chest pain.  Endocrine: Negative for polydipsia and polyphagia.  Genitourinary: Negative for frequency.  Neurological: Negative for weakness.  Psychiatric/Behavioral: Negative for confusion.       Objective:   Physical Exam  Constitutional: She appears well-nourished. No distress.  Cardiovascular: Normal rate, regular rhythm and normal heart sounds.   No murmur heard. Pulmonary/Chest: Effort normal and breath sounds normal. No respiratory distress.  Musculoskeletal: She exhibits no edema.  Lymphadenopathy:    She has no cervical adenopathy.  Neurological: She is alert. She exhibits normal muscle tone.  Psychiatric: Her behavior is normal.  Vitals reviewed.         Assessment & Plan:  Ankle pain-I feel this probably arthritis air. X-rays optional patient could get this in the future if she desires.  HTN-fairly good on recheck. Watch salt stay physically active take medication  Refills given regarding hydrocodone she was given additional prescription if she has ongoing trouble follow-up recheck here 3-4 months  Colonoscopy recommended patient defers

## 2014-05-19 NOTE — Patient Instructions (Signed)
DASH Eating Plan DASH stands for "Dietary Approaches to Stop Hypertension." The DASH eating plan is a healthy eating plan that has been shown to reduce high blood pressure (hypertension). Additional health benefits may include reducing the risk of type 2 diabetes mellitus, heart disease, and stroke. The DASH eating plan may also help with weight loss. WHAT DO I NEED TO KNOW ABOUT THE DASH EATING PLAN? For the DASH eating plan, you will follow these general guidelines:  Choose foods with a percent daily value for sodium of less than 5% (as listed on the food label).  Use salt-free seasonings or herbs instead of table salt or sea salt.  Check with your health care provider or pharmacist before using salt substitutes.  Eat lower-sodium products, often labeled as "lower sodium" or "no salt added."  Eat fresh foods.  Eat more vegetables, fruits, and low-fat dairy products.  Choose whole grains. Look for the word "whole" as the first word in the ingredient list.  Choose fish and skinless chicken or turkey more often than red meat. Limit fish, poultry, and meat to 6 oz (170 g) each day.  Limit sweets, desserts, sugars, and sugary drinks.  Choose heart-healthy fats.  Limit cheese to 1 oz (28 g) per day.  Eat more home-cooked food and less restaurant, buffet, and fast food.  Limit fried foods.  Cook foods using methods other than frying.  Limit canned vegetables. If you do use them, rinse them well to decrease the sodium.  When eating at a restaurant, ask that your food be prepared with less salt, or no salt if possible. WHAT FOODS CAN I EAT? Seek help from a dietitian for individual calorie needs. Grains Whole grain or whole wheat bread. Brown rice. Whole grain or whole wheat pasta. Quinoa, bulgur, and whole grain cereals. Low-sodium cereals. Corn or whole wheat flour tortillas. Whole grain cornbread. Whole grain crackers. Low-sodium crackers. Vegetables Fresh or frozen vegetables  (raw, steamed, roasted, or grilled). Low-sodium or reduced-sodium tomato and vegetable juices. Low-sodium or reduced-sodium tomato sauce and paste. Low-sodium or reduced-sodium canned vegetables.  Fruits All fresh, canned (in natural juice), or frozen fruits. Meat and Other Protein Products Ground beef (85% or leaner), grass-fed beef, or beef trimmed of fat. Skinless chicken or turkey. Ground chicken or turkey. Pork trimmed of fat. All fish and seafood. Eggs. Dried beans, peas, or lentils. Unsalted nuts and seeds. Unsalted canned beans. Dairy Low-fat dairy products, such as skim or 1% milk, 2% or reduced-fat cheeses, low-fat ricotta or cottage cheese, or plain low-fat yogurt. Low-sodium or reduced-sodium cheeses. Fats and Oils Tub margarines without trans fats. Light or reduced-fat mayonnaise and salad dressings (reduced sodium). Avocado. Safflower, olive, or canola oils. Natural peanut or almond butter. Other Unsalted popcorn and pretzels. The items listed above may not be a complete list of recommended foods or beverages. Contact your dietitian for more options. WHAT FOODS ARE NOT RECOMMENDED? Grains White bread. White pasta. White rice. Refined cornbread. Bagels and croissants. Crackers that contain trans fat. Vegetables Creamed or fried vegetables. Vegetables in a cheese sauce. Regular canned vegetables. Regular canned tomato sauce and paste. Regular tomato and vegetable juices. Fruits Dried fruits. Canned fruit in light or heavy syrup. Fruit juice. Meat and Other Protein Products Fatty cuts of meat. Ribs, chicken wings, bacon, sausage, bologna, salami, chitterlings, fatback, hot dogs, bratwurst, and packaged luncheon meats. Salted nuts and seeds. Canned beans with salt. Dairy Whole or 2% milk, cream, half-and-half, and cream cheese. Whole-fat or sweetened yogurt. Full-fat   cheeses or blue cheese. Nondairy creamers and whipped toppings. Processed cheese, cheese spreads, or cheese  curds. Condiments Onion and garlic salt, seasoned salt, table salt, and sea salt. Canned and packaged gravies. Worcestershire sauce. Tartar sauce. Barbecue sauce. Teriyaki sauce. Soy sauce, including reduced sodium. Steak sauce. Fish sauce. Oyster sauce. Cocktail sauce. Horseradish. Ketchup and mustard. Meat flavorings and tenderizers. Bouillon cubes. Hot sauce. Tabasco sauce. Marinades. Taco seasonings. Relishes. Fats and Oils Butter, stick margarine, lard, shortening, ghee, and bacon fat. Coconut, palm kernel, or palm oils. Regular salad dressings. Other Pickles and olives. Salted popcorn and pretzels. The items listed above may not be a complete list of foods and beverages to avoid. Contact your dietitian for more information. WHERE CAN I FIND MORE INFORMATION? National Heart, Lung, and Blood Institute: www.nhlbi.nih.gov/health/health-topics/topics/dash/ Document Released: 06/09/2011 Document Revised: 11/04/2013 Document Reviewed: 04/24/2013 ExitCare Patient Information 2015 Exit[employer]. This information is not intended to replace advice given to you by your health care provider. Make sure you discuss any questions you have with your health care provider. Hypertension Hypertension, commonly called high blood pressure, is when the force of blood pumping through your arteries is too strong. Your arteries are the blood vessels that carry blood from your heart throughout your body. A blood pressure reading consists of a higher number over a lower number, such as 110/72. The higher number (systolic) is the pressure inside your arteries when your heart pumps. The lower number (diastolic) is the pressure inside your arteries when your heart relaxes. Ideally you want your blood pressure below 120/80. Hypertension forces your heart to work harder to pump blood. Your arteries may become narrow or stiff. Having hypertension puts you at risk for heart disease, stroke, and other problems.  RISK  FACTORS Some risk factors for high blood pressure are controllable. Others are not.  Risk factors you cannot control include:   Race. You may be at higher risk if you are African American.  Age. Risk increases with age.  Gender. Men are at higher risk than women before age 45 years. After age 65, women are at higher risk than men. Risk factors you can control include:  Not getting enough exercise or physical activity.  Being overweight.  Getting too much fat, sugar, calories, or salt in your diet.  Drinking too much alcohol. SIGNS AND SYMPTOMS Hypertension does not usually cause signs or symptoms. Extremely high blood pressure (hypertensive crisis) may cause headache, anxiety, shortness of breath, and nosebleed. DIAGNOSIS  To check if you have hypertension, your health care provider will measure your blood pressure while you are seated, with your arm held at the level of your heart. It should be measured at least twice using the same arm. Certain conditions can cause a difference in blood pressure between your right and left arms. A blood pressure reading that is higher than normal on one occasion does not mean that you need treatment. If one blood pressure reading is high, ask your health care provider about having it checked again. TREATMENT  Treating high blood pressure includes making lifestyle changes and possibly taking medicine. Living a healthy lifestyle can help lower high blood pressure. You may need to change some of your habits. Lifestyle changes may include:  Following the DASH diet. This diet is high in fruits, vegetables, and whole grains. It is low in salt, red meat, and added sugars.  Getting at least 2 hours of brisk physical activity every week.  Losing weight if necessary.  Not smoking.  Limiting   alcoholic beverages.  Learning ways to reduce stress. If lifestyle changes are not enough to get your blood pressure under control, your health care provider may  prescribe medicine. You may need to take more than one. Work closely with your health care provider to understand the risks and benefits. HOME CARE INSTRUCTIONS  Have your blood pressure rechecked as directed by your health care provider.   Take medicines only as directed by your health care provider. Follow the directions carefully. Blood pressure medicines must be taken as prescribed. The medicine does not work as well when you skip doses. Skipping doses also puts you at risk for problems.   Do not smoke.   Monitor your blood pressure at home as directed by your health care provider. SEEK MEDICAL CARE IF:   You think you are having a reaction to medicines taken.  You have recurrent headaches or feel dizzy.  You have swelling in your ankles.  You have trouble with your vision. SEEK IMMEDIATE MEDICAL CARE IF:  You develop a severe headache or confusion.  You have unusual weakness, numbness, or feel faint.  You have severe chest or abdominal pain.  You vomit repeatedly.  You have trouble breathing. MAKE SURE YOU:   Understand these instructions.  Will watch your condition.  Will get help right away if you are not doing well or get worse. Document Released: 06/20/2005 Document Revised: 11/04/2013 Document Reviewed: 04/12/2013 ExitCare Patient Information 2015 ExitCare, [employer]. This information is not intended to replace advice given to you by your health care provider. Make sure you discuss any questions you have with your health care provider.  

## 2014-06-23 ENCOUNTER — Other Ambulatory Visit: Payer: Self-pay | Admitting: Family Medicine

## 2014-07-10 ENCOUNTER — Other Ambulatory Visit: Payer: Self-pay | Admitting: Family Medicine

## 2014-07-31 ENCOUNTER — Other Ambulatory Visit: Payer: Self-pay | Admitting: Family Medicine

## 2014-08-06 ENCOUNTER — Other Ambulatory Visit: Payer: Self-pay | Admitting: Family Medicine

## 2014-08-26 ENCOUNTER — Encounter: Payer: Self-pay | Admitting: Family Medicine

## 2014-08-26 ENCOUNTER — Ambulatory Visit (INDEPENDENT_AMBULATORY_CARE_PROVIDER_SITE_OTHER): Payer: PRIVATE HEALTH INSURANCE | Admitting: Family Medicine

## 2014-08-26 VITALS — BP 128/94 | Ht 65.0 in | Wt 220.0 lb

## 2014-08-26 DIAGNOSIS — I10 Essential (primary) hypertension: Secondary | ICD-10-CM

## 2014-08-26 DIAGNOSIS — M545 Low back pain, unspecified: Secondary | ICD-10-CM

## 2014-08-26 DIAGNOSIS — R5383 Other fatigue: Secondary | ICD-10-CM

## 2014-08-26 MED ORDER — PREDNISONE 20 MG PO TABS: ORAL_TABLET | ORAL | Status: DC

## 2014-08-26 MED ORDER — NAPROXEN 500 MG PO TABS: ORAL_TABLET | ORAL | Status: DC

## 2014-08-26 MED ORDER — HYDROCODONE-ACETAMINOPHEN 5-325 MG PO TABS: 1.0000 | ORAL_TABLET | Freq: Four times a day (QID) | ORAL | Status: DC | PRN

## 2014-08-26 MED ORDER — LISINOPRIL 20 MG PO TABS: 20.0000 mg | ORAL_TABLET | Freq: Every day | ORAL | Status: DC

## 2014-08-26 MED ORDER — HYDROCHLOROTHIAZIDE 25 MG PO TABS: ORAL_TABLET | ORAL | Status: DC

## 2014-08-26 NOTE — Progress Notes (Signed)
   Subjective:    Patient ID: Susan Oneill, female    DOB: Aug 03, 1950, 64 y.o.   MRN: [id number]  Hypertension This is a chronic problem. The current episode started more than 1 year ago. Compliance problems include exercise and diet.    Low Back pain on right side. Started about 2 weeks ago after pulling on something. Worse when first getting up or after standing a long time. She states there is no numbness going down the legs she relates it Ezequiel Ganser difficult to get out of bed in the morning because of stiffness in this area was not present before this. She also relates how she has not been doing any exercises cause he is not sure what would help her. She also voices that she does not one any advanced testing done currently because of her insurance does not cover this well  Needs refills on hydrocodone, lisinopril, HCTZ, and naproxen.    Review of Systems     Objective:   Physical Exam Lumbar area has moderate tenderness in the right lower back negative straight leg raise there is subjective discomfort in her knees. No excessive swelling in the ankles. The lungs are clear no crackle heart is regular pulses normal neurologic gross normal. Blood pressure was checked several times.       Assessment & Plan:  Lumbar pain-she will try short course steroids along with resuming her anti-inflammatories after steroids. Hydrocodone for pain, range of motion exercises were shown.  HTN-slight elevation she is try to do a better diet watch diet closely stay physically active on recheck her blood pressure was improved. Follow-up 3 months  Chronic knee pain back pain tramadol uses hydrocodone sparingly  Patient has history of anemia check CBC With patient's medications use she does need a metabolic 7. Hemoglobin A1c and lipid profile was good on a screening test from her work late last fall

## 2014-12-08 ENCOUNTER — Other Ambulatory Visit: Payer: Self-pay | Admitting: Family Medicine

## 2014-12-08 NOTE — Telephone Encounter (Signed)
May have this +3 refills 

## 2015-02-23 ENCOUNTER — Other Ambulatory Visit: Payer: Self-pay | Admitting: Family Medicine

## 2015-02-23 NOTE — Telephone Encounter (Signed)
May have this and 4 refills 

## 2015-03-04 ENCOUNTER — Other Ambulatory Visit: Payer: Self-pay | Admitting: Family Medicine

## 2015-03-10 ENCOUNTER — Other Ambulatory Visit: Payer: Self-pay | Admitting: *Deleted

## 2015-03-10 MED ORDER — HYDROCHLOROTHIAZIDE 25 MG PO TABS: ORAL_TABLET | ORAL | Status: DC

## 2015-03-13 ENCOUNTER — Ambulatory Visit (INDEPENDENT_AMBULATORY_CARE_PROVIDER_SITE_OTHER): Payer: PRIVATE HEALTH INSURANCE | Admitting: Family Medicine

## 2015-03-13 ENCOUNTER — Encounter: Payer: Self-pay | Admitting: Family Medicine

## 2015-03-13 VITALS — BP 130/86 | Ht 65.0 in | Wt 225.4 lb

## 2015-03-13 DIAGNOSIS — M1712 Unilateral primary osteoarthritis, left knee: Secondary | ICD-10-CM | POA: Diagnosis not present

## 2015-03-13 DIAGNOSIS — K219 Gastro-esophageal reflux disease without esophagitis: Secondary | ICD-10-CM | POA: Diagnosis not present

## 2015-03-13 DIAGNOSIS — M25571 Pain in right ankle and joints of right foot: Secondary | ICD-10-CM | POA: Diagnosis not present

## 2015-03-13 DIAGNOSIS — D6489 Other specified anemias: Secondary | ICD-10-CM

## 2015-03-13 DIAGNOSIS — I10 Essential (primary) hypertension: Secondary | ICD-10-CM

## 2015-03-13 NOTE — Progress Notes (Signed)
   Subjective:    Patient ID: Susan Oneill, female    DOB: Oct 11, 1950, 64 y.o.   MRN: [id number]  Hypertension This is a chronic problem. The current episode started more than 1 year ago. Pertinent negatives include no chest pain. Risk factors for coronary artery disease include post-menopausal state. Treatments tried: lisinopril, hctz. There are no compliance problems.     Patient under a moderate amount of stress her daughter and her husband are in the arms services they are being transferred to [facility] in [place]  Patient uses Xanax intermittently to help her with her nerves denies a causing drowsiness denies being addicted  Patient does have low back pain with intermittent sciatica down the right leg causes enough discomfort where it causes pain aching discomfort. Uses tramadol seems to help well  Patient uses naproxen twice a day for her left knee does not cause any gastritis. She does take her Nexium she states that she does not she does have some reflux issues. She does try to watch her diet. She does take 81 mg aspirin is preventative for heart disease.  Patient when she had her last knee replacement 9 months ago had significant anemia never had repeat CBC after that.  Colonoscopy was advised patient defers  Review of Systems  Constitutional: Negative for activity change, appetite change and fatigue.  HENT: Negative for congestion.   Respiratory: Negative for cough.   Cardiovascular: Negative for chest pain.  Gastrointestinal: Negative for abdominal pain.  Endocrine: Negative for polydipsia and polyphagia.  Neurological: Negative for weakness.  Psychiatric/Behavioral: Negative for confusion.   Patient with intermittent right ankle pain discomfort when she stands on it for long lengths of time    Objective:   Physical Exam  Constitutional: She appears well-nourished. No distress.  Cardiovascular: Normal rate, regular rhythm and normal heart sounds.   No murmur  heard. Pulmonary/Chest: Effort normal and breath sounds normal. No respiratory distress.  Musculoskeletal: She exhibits no edema.  Lymphadenopathy:    She has no cervical adenopathy.  Neurological: She is alert. She exhibits normal muscle tone.  Psychiatric: Her behavior is normal.  Vitals reviewed.  Patient has significant osteoarthritis of the left knee patient had surgically repaired right knee no significant swelling in the legs  More than likely has mild arthritis in the right ankle no deformity noted     Assessment & Plan:  1. Essential hypertension Blood pressure issues under good control continue current measures. Check metabolic 7. - CBC with Differential/Platelet - Basic metabolic panel  2. Right ankle pain Right ankle pain would benefit from Naprosyn twice a day should help this overall  3. Primary osteoarthritis of left knee Osteoarthritis left knee should benefit with the Naprosyn. Eventually will need knee replacement  4. Gastroesophageal reflux disease without esophagitis Does better on Nexium continue this currently.  5. Anemia due to other cause Had anemia earlier this year after surgery repeat CBC make sure that back to normal - CBC with Differential/Platelet Patient was advised colonoscopy. Xanax maybe use intermittently for anxiety issues caution drowsiness Tramadol intermittently for low back pain with right leg sciatica. Follow-up sooner if any conditions get worse otherwise follow-up in 6 months

## 2015-03-13 NOTE — Patient Instructions (Signed)
Mammogram - [phone]   Dear Patient,  It has been recommended to you that you have a colonoscopy. It is your responsibility to carry through with this recommendation.   Did you realize that colon cancer is the second leading cancer killer in the [place]. One in every 20 adults will get colon cancer. If all adults would go through the recommended screening for colon cancer (getting a colonoscopy), then there would be a 60% reduction in the number of people dying from colon cancer.  Colon cancer just doesn't come out of the blue. It starts off as a small polyp which over time grows into a cancer. A colonoscopy can prevent cancer and in many cases detected when it is at a very treatable phase. Small colon cancers can have cure rates of 95%. Advanced colon cancer, which often occurs in people who do not do their screenings, have cure rates less than 20%. The risk of colon cancer advances with age. Most adults should have regular colonoscopies every 10 years starting at age 54. This recommendation can vary depending on a person's medical history.  Health-care laws now allow for you to call the gastroenterologist office directly in order to set yourself up for this very important tests. Today we have recommended to you that you do this test. This test may save your life. Failure to do this test puts you at risk for premature death from colon cancer. Do the right thing and schedule this test now.  Here as a list of specialists we recommend in the surrounding area. When you call their office let them know that you are a patient of our practice in your interested in doing a screening colonoscopy. They should assist you without problems. You will need the following information when you called them: 1-name of which Dr. you see, 2-your insurance information, 3-a list of medications that you currently take, 4-any allergies you have to medications.  [facility] gastroenterologist Dr. Milton Ferguson, Dr Felicie Morn gastroenterologist   [facility]   [phone]  [facility] gastroenterology (Dr. Garnetta Buddy and [place]) [phone]  [facility] gastroenterology (Dr. Leia Alf, Harrietta Guardian, [place]) [phone]  Each group of specialists has assured [place] that when you called them they will help you get your colonoscopy set up. Should you have problems or if the GI practice insist a referral be done please let us know. Be sure to call soon. Sincerely, Pearson Forster, Dr Mickie Hillier, [place]

## 2015-03-14 LAB — BASIC METABOLIC PANEL
BUN/Creatinine Ratio: 22 (ref 11–26)
BUN: 15 mg/dL (ref 8–27)
CO2: 22 mmol/L (ref 18–29)
CREATININE: 0.69 mg/dL (ref 0.57–1.00)
Calcium: 9 mg/dL (ref 8.7–10.3)
Chloride: 107 mmol/L (ref 97–108)
GFR calc non Af Amer: 94 mL/min/{1.73_m2} (ref >59)
GFR, EST AFRICAN AMERICAN: 109 mL/min/{1.73_m2} (ref >59)
GLUCOSE: 86 mg/dL (ref 65–99)
Potassium: 4.1 mmol/L (ref 3.5–5.2)
SODIUM: 143 mmol/L (ref 134–144)

## 2015-03-14 LAB — CBC WITH DIFFERENTIAL/PLATELET
BASOS ABS: 0 10*3/uL (ref 0.0–0.2)
Basos: 1 %
EOS (ABSOLUTE): 0.1 10*3/uL (ref 0.0–0.4)
Eos: 3 %
Hematocrit: 36.9 % (ref 34.0–46.6)
Hemoglobin: 11.9 g/dL (ref 11.1–15.9)
Immature Grans (Abs): 0 10*3/uL (ref 0.0–0.1)
Immature Granulocytes: 0 %
Lymphocytes Absolute: 1.4 10*3/uL (ref 0.7–3.1)
Lymphs: 26 %
MCH: 27.9 pg (ref 26.6–33.0)
MCHC: 32.2 g/dL (ref 31.5–35.7)
MCV: 86 fL (ref 79–97)
Monocytes Absolute: 0.4 10*3/uL (ref 0.1–0.9)
Monocytes: 7 %
NEUTROS ABS: 3.4 10*3/uL (ref 1.4–7.0)
Neutrophils: 63 %
Platelets: 245 10*3/uL (ref 150–379)
RBC: 4.27 x10E6/uL (ref 3.77–5.28)
RDW: 14.8 % (ref 12.3–15.4)
WBC: 5.3 10*3/uL (ref 3.4–10.8)

## 2015-03-15 ENCOUNTER — Encounter: Payer: Self-pay | Admitting: Family Medicine

## 2015-03-25 ENCOUNTER — Other Ambulatory Visit: Payer: Self-pay | Admitting: Family Medicine

## 2015-03-31 ENCOUNTER — Telehealth: Payer: Self-pay | Admitting: Family Medicine

## 2015-03-31 MED ORDER — NAPROXEN 500 MG PO TABS: ORAL_TABLET | ORAL | Status: DC

## 2015-03-31 NOTE — Telephone Encounter (Signed)
naproxen (NAPROSYN) 500 MG tablet  Pt needs refill on this med please  [facility] [place]

## 2015-03-31 NOTE — Telephone Encounter (Signed)
Rx sent electronically to pharmacy. Patient notified. 

## 2015-03-31 NOTE — Telephone Encounter (Signed)
May refill 3 

## 2015-04-06 ENCOUNTER — Telehealth: Payer: Self-pay | Admitting: *Deleted

## 2015-04-06 ENCOUNTER — Other Ambulatory Visit: Payer: Self-pay | Admitting: *Deleted

## 2015-04-06 MED ORDER — HYDROCHLOROTHIAZIDE 25 MG PO TABS: ORAL_TABLET | ORAL | Status: DC

## 2015-04-06 NOTE — Telephone Encounter (Signed)
Rx request faxed from [facility] for atorvastatin 20mg . Med not on med list. [facility] to clarify with pt.

## 2015-04-10 ENCOUNTER — Telehealth: Payer: Self-pay | Admitting: Family Medicine

## 2015-04-10 MED ORDER — LISINOPRIL 20 MG PO TABS: 20.0000 mg | ORAL_TABLET | Freq: Every day | ORAL | Status: DC

## 2015-04-10 NOTE — Telephone Encounter (Signed)
Called Patient and informed her that refill for Lisinopril was sent into [facility]. Patient verbalized understanding.

## 2015-04-10 NOTE — Telephone Encounter (Signed)
lisinopril (PRINIVIL,ZESTRIL) 20 MG tablet  Pt needs a refill on this med please, last OV was 03/13/15   [place]

## 2015-05-25 ENCOUNTER — Other Ambulatory Visit: Payer: Self-pay | Admitting: Family Medicine

## 2015-05-25 NOTE — Telephone Encounter (Signed)
May have this in 2 refills 

## 2015-07-02 ENCOUNTER — Other Ambulatory Visit: Payer: Self-pay | Admitting: Family Medicine

## 2015-08-26 ENCOUNTER — Other Ambulatory Visit: Payer: Self-pay | Admitting: Family Medicine

## 2015-09-28 ENCOUNTER — Other Ambulatory Visit: Payer: Self-pay | Admitting: Family Medicine

## 2015-10-05 ENCOUNTER — Other Ambulatory Visit: Payer: Self-pay | Admitting: Family Medicine

## 2015-10-12 ENCOUNTER — Other Ambulatory Visit: Payer: Self-pay | Admitting: Family Medicine

## 2015-10-12 NOTE — Telephone Encounter (Signed)
May we refill Tramadol?

## 2015-10-12 NOTE — Telephone Encounter (Signed)
May refill all of these medicines with 2 additional refills-patient needs office visit in May or June

## 2015-10-13 ENCOUNTER — Other Ambulatory Visit: Payer: Self-pay | Admitting: Family Medicine

## 2015-10-13 NOTE — Telephone Encounter (Signed)
May have this and 5 refills

## 2015-10-26 ENCOUNTER — Other Ambulatory Visit: Payer: Self-pay | Admitting: Family Medicine

## 2015-10-26 NOTE — Telephone Encounter (Signed)
Patient may have this in 2 refills does need follow-up office visit

## 2015-11-02 ENCOUNTER — Ambulatory Visit (INDEPENDENT_AMBULATORY_CARE_PROVIDER_SITE_OTHER): Payer: BLUE CROSS/BLUE SHIELD | Admitting: Family Medicine

## 2015-11-02 ENCOUNTER — Encounter: Payer: Self-pay | Admitting: Family Medicine

## 2015-11-02 VITALS — BP 132/82 | Ht 65.0 in | Wt 221.1 lb

## 2015-11-02 DIAGNOSIS — M5431 Sciatica, right side: Secondary | ICD-10-CM

## 2015-11-02 DIAGNOSIS — I10 Essential (primary) hypertension: Secondary | ICD-10-CM

## 2015-11-02 DIAGNOSIS — M6740 Ganglion, unspecified site: Secondary | ICD-10-CM | POA: Diagnosis not present

## 2015-11-02 MED ORDER — GABAPENTIN 100 MG PO CAPS: 100.0000 mg | ORAL_CAPSULE | Freq: Three times a day (TID) | ORAL | Status: DC

## 2015-11-02 MED ORDER — ALPRAZOLAM 0.5 MG PO TABS: ORAL_TABLET | ORAL | Status: DC

## 2015-11-02 MED ORDER — MOMETASONE FUROATE 0.1 % EX CREA: 1.0000 "application " | TOPICAL_CREAM | Freq: Two times a day (BID) | CUTANEOUS | Status: DC

## 2015-11-02 NOTE — Progress Notes (Signed)
   Subjective:    Patient ID: Susan Oneill, female    DOB: 1951-01-31, 65 y.o.   MRN: [id number]  Hypertension This is a chronic problem. The current episode started more than 1 year ago. The problem has been gradually improving since onset. There are no associated agents to hypertension. There are no known risk factors for coronary artery disease. Treatments tried: lisinopril. The current treatment provides moderate improvement. There are no compliance problems.    Patient has rashes that will appear on her arms and legs from time to time. Onset 1 year ago. These rashes are nonspecific itches a little bit then he gets better than it goes away then it comes back again she tries steroid cream on it before seem to help a little bit. Patient has a cyst on her left hand also. Patient relates large cyst on the back of her left hand causes pain discomfort tenderness. Intermittent.  Patient also relates severe sciatica down the right leg present for the past 3 months no weakness in the leg.   Review of Systems Patient denies chest tightness pressure pain shortness breath nausea vomiting diarrhea she relates joint pains and discomforts.    Objective:   Physical Exam Neck no masses lungs clear no crackles heart regular pulse normal. Large ganglion cyst noted in the left hand. No rashes noted today. Negative straight leg raise reflexes good strength seemingly good      Assessment & Plan:  Nonspecific rash I recommend steroid cream as necessary also recommend her taking pictures if frequent reoccurrence next step would be referral to dermatology  Large ganglion cyst referral to orthopedics Dr. Aline Brochure for removal  HTN good control continue current measures  Sciatica right leg try Neurontin gradually titrated up 100 mg 3 times a day. If weakness occurs or progressive pain may need MRI follow-up again in 3 months  Significant osteoarthritis issues tramadol and Naprosyn seems to be doing  well continue current measures  25 minutes was spent with the patient. Greater than half the time was spent in discussion and answering questions and counseling regarding the issues that the patient came in for today.

## 2015-11-07 LAB — BASIC METABOLIC PANEL
BUN/Creatinine Ratio: 21 (ref 12–28)
BUN: 13 mg/dL (ref 8–27)
CO2: 24 mmol/L (ref 18–29)
Calcium: 9.1 mg/dL (ref 8.7–10.3)
Chloride: 101 mmol/L (ref 96–106)
Creatinine, Ser: 0.61 mg/dL (ref 0.57–1.00)
GFR calc non Af Amer: 97 mL/min/{1.73_m2} (ref >59)
GFR, EST AFRICAN AMERICAN: 112 mL/min/{1.73_m2} (ref >59)
GLUCOSE: 84 mg/dL (ref 65–99)
Potassium: 4.4 mmol/L (ref 3.5–5.2)
Sodium: 139 mmol/L (ref 134–144)

## 2015-11-07 LAB — LIPID PANEL
CHOL/HDL RATIO: 3.3 ratio (ref 0.0–4.4)
Cholesterol, Total: 192 mg/dL (ref 100–199)
HDL: 58 mg/dL (ref >39)
LDL CALC: 109 mg/dL — AB (ref 0–99)
TRIGLYCERIDES: 125 mg/dL (ref 0–149)
VLDL CHOLESTEROL CAL: 25 mg/dL (ref 5–40)

## 2015-11-08 ENCOUNTER — Encounter: Payer: Self-pay | Admitting: Family Medicine

## 2015-11-25 ENCOUNTER — Ambulatory Visit (INDEPENDENT_AMBULATORY_CARE_PROVIDER_SITE_OTHER): Payer: PRIVATE HEALTH INSURANCE | Admitting: Orthopedic Surgery

## 2015-11-25 ENCOUNTER — Ambulatory Visit (INDEPENDENT_AMBULATORY_CARE_PROVIDER_SITE_OTHER): Payer: PRIVATE HEALTH INSURANCE

## 2015-11-25 ENCOUNTER — Encounter: Payer: Self-pay | Admitting: Orthopedic Surgery

## 2015-11-25 VITALS — BP 173/81 | HR 65 | Resp 16 | Ht 65.0 in | Wt 222.0 lb

## 2015-11-25 DIAGNOSIS — M6740 Ganglion, unspecified site: Secondary | ICD-10-CM

## 2015-11-25 DIAGNOSIS — M85642 Other cyst of bone, left hand: Secondary | ICD-10-CM

## 2015-11-25 NOTE — Patient Instructions (Signed)
Ganglion Cyst  A ganglion cyst is a noncancerous, fluid-filled lump that occurs near joints or tendons. The ganglion cyst grows out of a joint or the lining of a tendon. It most often develops in the hand or wrist, but it can also develop in the shoulder, elbow, hip, knee, ankle, or foot. The round or oval ganglion cyst can be the size of a pea or larger than a grape. Increased activity may enlarge the size of the cyst because more fluid starts to build up.   CAUSES  It is not known what causes a ganglion cyst to grow. However, it may be related to:  · Inflammation or irritation around the joint.  · An injury.  · Repetitive movements or overuse.  · Arthritis.  RISK FACTORS  Risk factors include:  · Being a woman.  · Being age 20-50.  SIGNS AND SYMPTOMS  Symptoms may include:   · A lump. This most often appears on the hand or wrist, but it can occur in other areas of the body.  · Tingling.  · Pain.  · Numbness.  · Muscle weakness.  · Weak grip.  · Less movement in a joint.  DIAGNOSIS  Ganglion cysts are most often diagnosed based on a physical exam. Your health care provider will feel the lump and may shine a light alongside it. If it is a ganglion cyst, a light often shines through it. Your health care provider may order an X-ray, ultrasound, or MRI to rule out other conditions.  TREATMENT  Ganglion cysts usually go away on their own without treatment. If pain or other symptoms are involved, treatment may be needed. Treatment is also needed if the ganglion cyst limits your movement or if it gets infected. Treatment may include:  · Wearing a brace or splint on your wrist or finger.  · Taking anti-inflammatory medicine.  · Draining fluid from the lump with a needle (aspiration).  · Injecting a steroid into the joint.  · Surgery to remove the ganglion cyst.  HOME CARE INSTRUCTIONS  · Do not press on the ganglion cyst, poke it with a needle, or hit it.  · Take medicines only as directed by your health care  provider.  · Wear your brace or splint as directed by your health care provider.  · Watch your ganglion cyst for any changes.  · Keep all follow-up visits as directed by your health care provider. This is important.  SEEK MEDICAL CARE IF:  · Your ganglion cyst becomes larger or more painful.  · You have increased redness, red streaks, or swelling.  · You have pus coming from the lump.  · You have weakness or numbness in the affected area.  · You have a fever or chills.     This information is not intended to replace advice given to you by your health care provider. Make sure you discuss any questions you have with your health care provider.     Document Released: 06/17/2000 Document Revised: 07/11/2014 Document Reviewed: 12/03/2013  [employer]vier Interactive Patient Education ©2016 [employer]
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
## 2015-11-25 NOTE — Progress Notes (Signed)
Aspiration dorsal wrist/hand ganglion  Verbal consent was obtained timeout confirmed site of aspiration  Alcohol and ethyl chloride were used to anesthetize the hand we aspirated after the prep using an 18-gauge needle and got 2-3 mL of gelatinous material  This was well tolerated

## 2015-11-27 ENCOUNTER — Other Ambulatory Visit: Payer: Self-pay | Admitting: Family Medicine

## 2015-12-21 ENCOUNTER — Ambulatory Visit (INDEPENDENT_AMBULATORY_CARE_PROVIDER_SITE_OTHER): Payer: Self-pay | Admitting: Family Medicine

## 2015-12-21 ENCOUNTER — Encounter: Payer: Self-pay | Admitting: Family Medicine

## 2015-12-21 VITALS — BP 114/76 | Temp 98.9°F | Ht 65.0 in | Wt 212.0 lb

## 2015-12-21 DIAGNOSIS — S20211A Contusion of right front wall of thorax, initial encounter: Secondary | ICD-10-CM

## 2015-12-21 DIAGNOSIS — S301XXD Contusion of abdominal wall, subsequent encounter: Secondary | ICD-10-CM

## 2015-12-21 DIAGNOSIS — N390 Urinary tract infection, site not specified: Secondary | ICD-10-CM

## 2015-12-21 DIAGNOSIS — M5431 Sciatica, right side: Secondary | ICD-10-CM

## 2015-12-21 LAB — POCT URINALYSIS DIPSTICK
PH UA: 5
Spec Grav, UA: 1.02
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
## 2015-12-22 DIAGNOSIS — Z029 Encounter for administrative examinations, unspecified: Secondary | ICD-10-CM

## 2015-12-24 LAB — URINE CULTURE: ORGANISM ID, BACTERIA: NO GROWTH

## 2015-12-27 ENCOUNTER — Encounter: Payer: Self-pay | Admitting: Family Medicine

## 2016-01-26 ENCOUNTER — Ambulatory Visit (INDEPENDENT_AMBULATORY_CARE_PROVIDER_SITE_OTHER): Payer: Self-pay | Admitting: Family Medicine

## 2016-01-26 ENCOUNTER — Encounter: Payer: Self-pay | Admitting: Family Medicine

## 2016-01-26 VITALS — BP 128/84 | Ht 65.0 in | Wt 221.0 lb

## 2016-01-26 DIAGNOSIS — S301XXD Contusion of abdominal wall, subsequent encounter: Secondary | ICD-10-CM

## 2016-01-26 DIAGNOSIS — S161XXA Strain of muscle, fascia and tendon at neck level, initial encounter: Secondary | ICD-10-CM

## 2016-01-26 MED ORDER — MOMETASONE FUROATE 0.1 % EX CREA: TOPICAL_CREAM | CUTANEOUS | 1 refills | Status: DC

## 2016-01-26 MED ORDER — GABAPENTIN 100 MG PO CAPS: 100.0000 mg | ORAL_CAPSULE | Freq: Two times a day (BID) | ORAL | 5 refills | Status: DC

## 2016-01-26 MED ORDER — LISINOPRIL 20 MG PO TABS: ORAL_TABLET | ORAL | 6 refills | Status: DC

## 2016-01-26 MED ORDER — HYDROCHLOROTHIAZIDE 25 MG PO TABS: 25.0000 mg | ORAL_TABLET | Freq: Every day | ORAL | 6 refills | Status: DC
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
## 2016-02-01 ENCOUNTER — Other Ambulatory Visit: Payer: Self-pay | Admitting: Family Medicine

## 2016-02-01 NOTE — Telephone Encounter (Signed)
Ok plus 3 monthly ref 

## 2016-02-11 DIAGNOSIS — Z0289 Encounter for other administrative examinations: Secondary | ICD-10-CM

## 2016-03-01 ENCOUNTER — Other Ambulatory Visit: Payer: Self-pay | Admitting: Family Medicine

## 2016-04-26 ENCOUNTER — Other Ambulatory Visit: Payer: Self-pay | Admitting: Family Medicine

## 2016-04-26 NOTE — Telephone Encounter (Signed)
May have this +3 refills 

## 2016-04-27 ENCOUNTER — Ambulatory Visit: Payer: BLUE CROSS/BLUE SHIELD | Admitting: Family Medicine

## 2016-04-28 ENCOUNTER — Encounter: Payer: Self-pay | Admitting: Family Medicine

## 2016-04-28 ENCOUNTER — Ambulatory Visit (INDEPENDENT_AMBULATORY_CARE_PROVIDER_SITE_OTHER): Payer: Self-pay | Admitting: Family Medicine

## 2016-04-28 VITALS — BP 138/90 | Ht 65.0 in | Wt 221.5 lb

## 2016-04-28 DIAGNOSIS — S161XXD Strain of muscle, fascia and tendon at neck level, subsequent encounter: Secondary | ICD-10-CM

## 2016-04-28 DIAGNOSIS — S301XXD Contusion of abdominal wall, subsequent encounter: Secondary | ICD-10-CM

## 2016-04-28 MED ORDER — TRAMADOL HCL 50 MG PO TABS: 50.0000 mg | ORAL_TABLET | ORAL | 3 refills | Status: DC | PRN
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
## 2016-05-31 ENCOUNTER — Other Ambulatory Visit: Payer: Self-pay | Admitting: Family Medicine

## 2016-05-31 NOTE — Telephone Encounter (Signed)
Refill this plus one additional

## 2016-06-02 ENCOUNTER — Other Ambulatory Visit: Payer: Self-pay | Admitting: Family Medicine

## 2016-07-27 ENCOUNTER — Other Ambulatory Visit: Payer: Self-pay | Admitting: Family Medicine

## 2016-07-27 NOTE — Telephone Encounter (Signed)
May have this +2 additional refills needs office visit this spring

## 2016-08-15 ENCOUNTER — Other Ambulatory Visit: Payer: Self-pay | Admitting: Family Medicine

## 2016-08-15 NOTE — Telephone Encounter (Signed)
Patient may have this +2 refills needs office visit in the spring

## 2016-08-15 NOTE — Telephone Encounter (Signed)
Last seen 04/28/16 ([place])

## 2016-08-16 ENCOUNTER — Other Ambulatory Visit: Payer: Self-pay | Admitting: Family Medicine

## 2016-08-17 NOTE — Telephone Encounter (Signed)
Patient may have this +3 refills

## 2016-08-31 ENCOUNTER — Other Ambulatory Visit: Payer: Self-pay | Admitting: Family Medicine

## 2016-08-31 NOTE — Telephone Encounter (Signed)
May have one refill of each with one additional refill needs office visit

## 2016-10-24 ENCOUNTER — Other Ambulatory Visit: Payer: Self-pay | Admitting: Family Medicine

## 2016-10-24 NOTE — Telephone Encounter (Signed)
May have one refill needs office visit

## 2016-10-27 ENCOUNTER — Other Ambulatory Visit: Payer: Self-pay | Admitting: Family Medicine

## 2016-10-27 NOTE — Telephone Encounter (Signed)
One refill each needs office visit 

## 2016-10-27 NOTE — Telephone Encounter (Signed)
Last seen 04/28/16

## 2016-11-10 ENCOUNTER — Encounter: Payer: Self-pay | Admitting: Family Medicine

## 2016-11-10 ENCOUNTER — Ambulatory Visit (INDEPENDENT_AMBULATORY_CARE_PROVIDER_SITE_OTHER): Payer: BLUE CROSS/BLUE SHIELD | Admitting: Family Medicine

## 2016-11-10 VITALS — BP 128/84 | Ht 65.0 in | Wt 214.0 lb

## 2016-11-10 DIAGNOSIS — K219 Gastro-esophageal reflux disease without esophagitis: Secondary | ICD-10-CM | POA: Diagnosis not present

## 2016-11-10 DIAGNOSIS — I10 Essential (primary) hypertension: Secondary | ICD-10-CM | POA: Diagnosis not present

## 2016-11-10 DIAGNOSIS — M1712 Unilateral primary osteoarthritis, left knee: Secondary | ICD-10-CM

## 2016-11-10 MED ORDER — ALPRAZOLAM 0.5 MG PO TABS: ORAL_TABLET | ORAL | 4 refills | Status: DC

## 2016-11-10 MED ORDER — LISINOPRIL 20 MG PO TABS: 20.0000 mg | ORAL_TABLET | Freq: Every day | ORAL | 5 refills | Status: DC

## 2016-11-10 MED ORDER — HYDROCHLOROTHIAZIDE 25 MG PO TABS: 25.0000 mg | ORAL_TABLET | Freq: Every day | ORAL | 5 refills | Status: DC

## 2016-11-10 MED ORDER — TRAMADOL HCL 50 MG PO TABS: ORAL_TABLET | ORAL | 5 refills | Status: DC

## 2016-11-10 NOTE — Progress Notes (Signed)
   Subjective:    Patient ID: Susan Oneill, female    DOB: [birth year], 66 y.o.   MRN: [id number]  Hypertension  This is a chronic problem. The current episode started more than 1 year ago. Treatments tried: lisinopril, hctz. There are no compliance problems (takes meds every day, health conscious with diet, active job).    Pt brought in bloodwork results done at work in January.  The patient had lab work which was completed which primarily looked at her glucose and her lipids but did not look at metabolic 7  Review of Systems    denies any chest tightness pressure pain shortness breath Objective:   Physical Exam Lungs are clear hearts regular pulse normal extremities no edema skin warm dry       Assessment & Plan:  Hyperlipidemia HDL very good LDL slightly elevated watch diet stay active  Mild arthritis anti-inflammatories and tramadol she does not abuse that  HTN good control continue current measures  Anxiety uses Xanax intermittently denies abusing it denies drowsiness  Reflux good control continue current measures

## 2016-11-11 ENCOUNTER — Encounter: Payer: Self-pay | Admitting: Family Medicine

## 2016-11-11 LAB — BASIC METABOLIC PANEL
BUN / CREAT RATIO: 26 (ref 12–28)
BUN: 17 mg/dL (ref 8–27)
CHLORIDE: 101 mmol/L (ref 96–106)
CO2: 25 mmol/L (ref 18–29)
Calcium: 9.3 mg/dL (ref 8.7–10.3)
Creatinine, Ser: 0.66 mg/dL (ref 0.57–1.00)
GFR calc Af Amer: 109 mL/min/{1.73_m2} (ref >59)
GFR, EST NON AFRICAN AMERICAN: 94 mL/min/{1.73_m2} (ref >59)
Glucose: 83 mg/dL (ref 65–99)
POTASSIUM: 4.3 mmol/L (ref 3.5–5.2)
Sodium: 141 mmol/L (ref 134–144)

## 2016-12-01 ENCOUNTER — Other Ambulatory Visit: Payer: Self-pay | Admitting: Family Medicine

## 2016-12-30 ENCOUNTER — Other Ambulatory Visit: Payer: Self-pay | Admitting: Nurse Practitioner

## 2016-12-30 NOTE — Telephone Encounter (Signed)
Last seen 11/10/16

## 2017-01-31 ENCOUNTER — Other Ambulatory Visit: Payer: Self-pay | Admitting: Nurse Practitioner

## 2017-02-27 ENCOUNTER — Other Ambulatory Visit: Payer: Self-pay | Admitting: Nurse Practitioner

## 2017-03-29 ENCOUNTER — Other Ambulatory Visit: Payer: Self-pay | Admitting: Nurse Practitioner

## 2017-04-25 ENCOUNTER — Other Ambulatory Visit: Payer: Self-pay | Admitting: Family Medicine

## 2017-04-25 NOTE — Telephone Encounter (Signed)
May give this plus one additional refill needs follow-up office visit by the end of the year

## 2017-04-25 NOTE — Telephone Encounter (Signed)
Last seen 05/21/8

## 2017-04-26 ENCOUNTER — Telehealth: Payer: Self-pay | Admitting: Family Medicine

## 2017-04-26 ENCOUNTER — Other Ambulatory Visit: Payer: Self-pay | Admitting: Family Medicine

## 2017-04-26 MED ORDER — ALPRAZOLAM 0.5 MG PO TABS: ORAL_TABLET | ORAL | 2 refills | Status: DC

## 2017-04-26 NOTE — Telephone Encounter (Signed)
Prescription faxed to pharmacy. Patient notified. 

## 2017-04-26 NOTE — Telephone Encounter (Signed)
Ok plus two monthly ref 

## 2017-04-26 NOTE — Telephone Encounter (Signed)
Patient is completely out of her Alprazolam and is requesting a refill to be sent in today.  Please advise.  [place]

## 2017-04-27 ENCOUNTER — Other Ambulatory Visit: Payer: Self-pay | Admitting: Family Medicine

## 2017-04-27 NOTE — Telephone Encounter (Signed)
This +1 refill patient does need follow-up office visit

## 2017-05-01 ENCOUNTER — Other Ambulatory Visit: Payer: Self-pay | Admitting: Nurse Practitioner

## 2017-05-29 ENCOUNTER — Ambulatory Visit: Payer: BLUE CROSS/BLUE SHIELD | Admitting: Family Medicine

## 2017-05-29 ENCOUNTER — Encounter: Payer: Self-pay | Admitting: Family Medicine

## 2017-05-29 VITALS — BP 140/88 | Ht 65.0 in | Wt 222.4 lb

## 2017-05-29 DIAGNOSIS — I10 Essential (primary) hypertension: Secondary | ICD-10-CM | POA: Diagnosis not present

## 2017-05-29 DIAGNOSIS — E785 Hyperlipidemia, unspecified: Secondary | ICD-10-CM | POA: Diagnosis not present

## 2017-05-29 DIAGNOSIS — K219 Gastro-esophageal reflux disease without esophagitis: Secondary | ICD-10-CM | POA: Diagnosis not present

## 2017-05-29 DIAGNOSIS — Z1159 Encounter for screening for other viral diseases: Secondary | ICD-10-CM

## 2017-05-29 DIAGNOSIS — R5383 Other fatigue: Secondary | ICD-10-CM

## 2017-05-29 DIAGNOSIS — Z114 Encounter for screening for human immunodeficiency virus [HIV]: Secondary | ICD-10-CM

## 2017-05-29 MED ORDER — LISINOPRIL 20 MG PO TABS: 20.0000 mg | ORAL_TABLET | Freq: Every day | ORAL | 6 refills | Status: DC

## 2017-05-29 MED ORDER — HYDROCHLOROTHIAZIDE 25 MG PO TABS: 25.0000 mg | ORAL_TABLET | Freq: Every day | ORAL | 6 refills | Status: DC

## 2017-05-29 MED ORDER — TRAMADOL HCL 50 MG PO TABS: ORAL_TABLET | ORAL | 5 refills | Status: DC

## 2017-05-29 NOTE — Progress Notes (Signed)
   Subjective:    Patient ID: Susan Oneill, female    DOB: Feb 04, 1951, 66 y.o.   MRN: [id number]  Hypertension  This is a chronic problem. The current episode started more than 1 year ago. Pertinent negatives include no chest pain, headaches or shortness of breath. Risk factors for coronary artery disease include dyslipidemia and post-menopausal state. Treatments tried: lisinopril and hctz. There are no compliance problems.    Patient does have reflux issues takes medication as directed denies any problems She does have significant tiredness and fatigue she relates this to her work she denies bleeding issues Patient with lipid issues she keeps this under control with diet Occasional anxiety related related issues takes Xanax on a as needed basis denies being depressed He does have joint pain and discomfort in her back and her knees takes tramadol twice daily on a regular basis    Review of Systems  Constitutional: Negative for activity change, fatigue and fever.  HENT: Negative for congestion.   Respiratory: Negative for cough, chest tightness and shortness of breath.   Cardiovascular: Negative for chest pain and leg swelling.  Gastrointestinal: Negative for abdominal pain.  Skin: Negative for color change.  Neurological: Negative for headaches.  Psychiatric/Behavioral: Negative for behavioral problems.       Objective:   Physical Exam  Constitutional: She appears well-developed and well-nourished. No distress.  HENT:  Head: Normocephalic and atraumatic.  Eyes: Right eye exhibits no discharge. Left eye exhibits no discharge.  Neck: No tracheal deviation present.  Cardiovascular: Normal rate, regular rhythm and normal heart sounds.  No murmur heard. Pulmonary/Chest: Effort normal and breath sounds normal. No respiratory distress. She has no wheezes. She has no rales.  Musculoskeletal: She exhibits no edema.  Lymphadenopathy:    She has no cervical adenopathy.    Neurological: She is alert. She exhibits normal muscle tone.  Skin: Skin is warm and dry. No erythema.  Psychiatric: Her behavior is normal.  Vitals reviewed.    25 minutes was spent with the patient. Greater than half the time was spent in discussion and answering questions and counseling regarding the issues that the patient came in for today.      Assessment & Plan:  Blood pressure good doing good job with taking medication.  Watching diet  Mild obesity portion control activity recommended  Mild fatigue check CBC  Reflux under good control with OTC medication  Hyperlipidemia mild no medications indicated currently check lab work  Chronic knee pain and discomfort tramadol on a regular basis  Mild anxiety related issues may use Xanax on a as needed basis not for frequent use cautioned drowsiness  Follow-up 6 months  Colonoscopy recommended patient is considering

## 2017-05-30 ENCOUNTER — Other Ambulatory Visit: Payer: Self-pay | Admitting: Family Medicine

## 2017-06-26 ENCOUNTER — Other Ambulatory Visit: Payer: Self-pay | Admitting: Family Medicine

## 2017-06-26 NOTE — Telephone Encounter (Signed)
May refill this x5

## 2017-07-16 ENCOUNTER — Encounter: Payer: Self-pay | Admitting: Family Medicine

## 2017-07-16 LAB — LIPID PANEL
CHOL/HDL RATIO: 3.3 ratio (ref 0.0–4.4)
Cholesterol, Total: 189 mg/dL (ref 100–199)
HDL: 57 mg/dL (ref >39)
LDL CALC: 111 mg/dL — AB (ref 0–99)
Triglycerides: 104 mg/dL (ref 0–149)
VLDL Cholesterol Cal: 21 mg/dL (ref 5–40)

## 2017-07-16 LAB — CBC WITH DIFFERENTIAL/PLATELET
BASOS ABS: 0 10*3/uL (ref 0.0–0.2)
BASOS: 1 %
EOS (ABSOLUTE): 0.1 10*3/uL (ref 0.0–0.4)
Eos: 4 %
HEMOGLOBIN: 12.5 g/dL (ref 11.1–15.9)
Hematocrit: 39 % (ref 34.0–46.6)
Immature Grans (Abs): 0 10*3/uL (ref 0.0–0.1)
Immature Granulocytes: 0 %
LYMPHS: 34 %
Lymphocytes Absolute: 1.1 10*3/uL (ref 0.7–3.1)
MCH: 28.2 pg (ref 26.6–33.0)
MCHC: 32.1 g/dL (ref 31.5–35.7)
MCV: 88 fL (ref 79–97)
MONOCYTES: 6 %
Monocytes Absolute: 0.2 10*3/uL (ref 0.1–0.9)
NEUTROS ABS: 1.8 10*3/uL (ref 1.4–7.0)
Neutrophils: 55 %
Platelets: 238 10*3/uL (ref 150–379)
RBC: 4.43 x10E6/uL (ref 3.77–5.28)
RDW: 14.1 % (ref 12.3–15.4)
WBC: 3.3 10*3/uL — ABNORMAL LOW (ref 3.4–10.8)

## 2017-07-16 LAB — BASIC METABOLIC PANEL
BUN / CREAT RATIO: 23 (ref 12–28)
BUN: 15 mg/dL (ref 8–27)
CO2: 22 mmol/L (ref 20–29)
CREATININE: 0.64 mg/dL (ref 0.57–1.00)
Calcium: 9 mg/dL (ref 8.7–10.3)
Chloride: 105 mmol/L (ref 96–106)
GFR calc Af Amer: 109 mL/min/{1.73_m2} (ref >59)
GFR, EST NON AFRICAN AMERICAN: 95 mL/min/{1.73_m2} (ref >59)
Glucose: 87 mg/dL (ref 65–99)
Potassium: 5 mmol/L (ref 3.5–5.2)
SODIUM: 142 mmol/L (ref 134–144)

## 2017-07-16 LAB — HIV ANTIBODY (ROUTINE TESTING W REFLEX): HIV Screen 4th Generation wRfx: NONREACTIVE

## 2017-07-16 LAB — HEPATIC FUNCTION PANEL
ALBUMIN: 4.1 g/dL (ref 3.6–4.8)
ALT: 11 IU/L (ref 0–32)
AST: 17 IU/L (ref 0–40)
Alkaline Phosphatase: 58 IU/L (ref 39–117)
BILIRUBIN TOTAL: 0.4 mg/dL (ref 0.0–1.2)
Bilirubin, Direct: 0.12 mg/dL (ref 0.00–0.40)
TOTAL PROTEIN: 6.1 g/dL (ref 6.0–8.5)

## 2017-07-16 LAB — HEPATITIS C ANTIBODY: Hep C Virus Ab: <0.1 s/co ratio (ref 0.0–0.9)

## 2017-08-25 ENCOUNTER — Other Ambulatory Visit: Payer: Self-pay | Admitting: Family Medicine

## 2017-08-25 NOTE — Telephone Encounter (Signed)
Ok plus five monthly ref 

## 2017-11-28 ENCOUNTER — Other Ambulatory Visit: Payer: Self-pay | Admitting: Family Medicine

## 2017-11-28 NOTE — Telephone Encounter (Signed)
1 refill needs office visit 

## 2017-11-29 ENCOUNTER — Other Ambulatory Visit: Payer: Self-pay | Admitting: Family Medicine

## 2017-12-01 ENCOUNTER — Ambulatory Visit: Payer: BLUE CROSS/BLUE SHIELD | Admitting: Family Medicine

## 2017-12-01 ENCOUNTER — Encounter: Payer: Self-pay | Admitting: Family Medicine

## 2017-12-01 VITALS — BP 150/90 | Ht 65.0 in | Wt 222.0 lb

## 2017-12-01 DIAGNOSIS — M1710 Unilateral primary osteoarthritis, unspecified knee: Secondary | ICD-10-CM | POA: Diagnosis not present

## 2017-12-01 DIAGNOSIS — Z79899 Other long term (current) drug therapy: Secondary | ICD-10-CM

## 2017-12-01 DIAGNOSIS — R5383 Other fatigue: Secondary | ICD-10-CM | POA: Diagnosis not present

## 2017-12-01 DIAGNOSIS — I10 Essential (primary) hypertension: Secondary | ICD-10-CM

## 2017-12-01 DIAGNOSIS — E785 Hyperlipidemia, unspecified: Secondary | ICD-10-CM

## 2017-12-01 DIAGNOSIS — M179 Osteoarthritis of knee, unspecified: Secondary | ICD-10-CM

## 2017-12-01 MED ORDER — TRAMADOL HCL 50 MG PO TABS: ORAL_TABLET | ORAL | 5 refills | Status: DC

## 2017-12-01 MED ORDER — LISINOPRIL 20 MG PO TABS: 20.0000 mg | ORAL_TABLET | Freq: Every day | ORAL | 6 refills | Status: DC

## 2017-12-01 MED ORDER — HYDROCHLOROTHIAZIDE 25 MG PO TABS: 25.0000 mg | ORAL_TABLET | Freq: Every day | ORAL | 6 refills | Status: DC

## 2017-12-01 NOTE — Progress Notes (Signed)
   Subjective:    Patient ID: Susan Oneill, female    DOB: 06-07-1951, 67 y.o.   MRN: [id number]  HPI  Patient is here today to follow up on her chronic health issues. She takes lisinopril 20 mg one daily for htn. She eats healthy and does not get much exercise other than walking at work.She does not see any specialist.  Patient for blood pressure check up. Patient relates compliance with meds. Todays BP reviewed with the patient. Patient denies issues with medication. Patient relates reasonable diet. Patient tries to minimize salt. Patient aware of BP goals.  Patient here for follow-up regarding cholesterol.  Patient does try to maintain a reasonable diet.  Patient does take the medication on a regular basis.  Denies missing a dose.  The patient denies any obvious side effects.  Prior blood work results reviewed with the patient.  The patient is aware of his cholesterol goals and the need to keep it under good control to lessen the risk of disease.  Arthritis of her knees are discussed today as well she is trying to be as active as possible but it is very difficult for her to stand on a regular basis with her work Review of Systems  Constitutional: Negative for activity change, fatigue and fever.  HENT: Negative for congestion.   Respiratory: Negative for cough, chest tightness and shortness of breath.   Cardiovascular: Negative for chest pain and leg swelling.  Gastrointestinal: Negative for abdominal pain.  Skin: Negative for color change.  Neurological: Negative for headaches.  Psychiatric/Behavioral: Negative for behavioral problems.       Objective:   Physical Exam  Constitutional: She appears well-nourished. No distress.  HENT:  Head: Normocephalic and atraumatic.  Eyes: Right eye exhibits no discharge. Left eye exhibits no discharge. No scleral icterus.  Cardiovascular: Normal rate, regular rhythm and normal heart sounds.  No murmur heard. Pulmonary/Chest: Effort normal  and breath sounds normal. No respiratory distress.  Musculoskeletal: She exhibits no edema.  Lymphadenopathy:    She has no cervical adenopathy.  Neurological: She is alert. She exhibits normal muscle tone.  Psychiatric: Her behavior is normal.  Vitals reviewed.     Colonoscopy recommended patient defers she will think about it    Assessment & Plan:  HTN good control watch diet stay physically active  Osteoarthritis may continue anti-inflammatory-May continue tramadol  Hyperlipidemia watch diet closely stay active check lab work  Significant fatigue lab work ordered await results  Patient denies being depressed  Mild stress related issues uses Xanax sparingly  Patient has significant leg related to her working 5 days a week sometimes 50 hours a week she hopes to retire within the next year

## 2018-01-27 ENCOUNTER — Other Ambulatory Visit: Payer: Self-pay | Admitting: Family Medicine

## 2018-02-01 ENCOUNTER — Telehealth: Payer: Self-pay | Admitting: Family Medicine

## 2018-02-01 NOTE — Telephone Encounter (Signed)
Patient got a EMM[facility] prevent call reminding her to get her colonoscopy.  She wants to know if she needs to come to the office and get a stool test or does she need the traditional colonoscopy?

## 2018-02-02 ENCOUNTER — Other Ambulatory Visit: Payer: Self-pay | Admitting: Family Medicine

## 2018-02-02 DIAGNOSIS — Z1211 Encounter for screening for malignant neoplasm of colon: Secondary | ICD-10-CM

## 2018-02-02 NOTE — Progress Notes (Unsigned)
if

## 2018-02-02 NOTE — Telephone Encounter (Signed)
So I have spoken with this patient multiple times through the years how she should do a colonoscopy.  The colonoscopy is the best test.  I do not recommend Cologuard because it has too many false positives Stool testing for blood can be done through the office but that would be through a office visit If she decides she would like to go ahead with the colonoscopy we can make a referral to gastroenterology.

## 2018-02-02 NOTE — Telephone Encounter (Signed)
Spoke with patient husband; pt husband will come by today to pick up the IFOBT or patient will come by Monday and pick up the IFOBT. IFOBT is up front.

## 2018-02-16 ENCOUNTER — Other Ambulatory Visit: Payer: Self-pay

## 2018-02-16 DIAGNOSIS — Z1211 Encounter for screening for malignant neoplasm of colon: Secondary | ICD-10-CM

## 2018-02-16 LAB — IFOBT (OCCULT BLOOD): IFOBT: NEGATIVE

## 2018-02-17 ENCOUNTER — Encounter: Payer: Self-pay | Admitting: Family Medicine

## 2018-02-26 ENCOUNTER — Other Ambulatory Visit: Payer: Self-pay | Admitting: Family Medicine

## 2018-02-26 NOTE — Telephone Encounter (Signed)
This and 5 rfills

## 2018-03-27 ENCOUNTER — Other Ambulatory Visit: Payer: Self-pay | Admitting: Family Medicine

## 2018-05-24 ENCOUNTER — Encounter: Payer: Self-pay | Admitting: Family Medicine

## 2018-05-24 ENCOUNTER — Ambulatory Visit: Payer: BLUE CROSS/BLUE SHIELD | Admitting: Family Medicine

## 2018-05-24 VITALS — BP 148/90 | Ht 65.0 in | Wt 230.0 lb

## 2018-05-24 DIAGNOSIS — I10 Essential (primary) hypertension: Secondary | ICD-10-CM | POA: Diagnosis not present

## 2018-05-24 DIAGNOSIS — D709 Neutropenia, unspecified: Secondary | ICD-10-CM | POA: Diagnosis not present

## 2018-05-24 DIAGNOSIS — E785 Hyperlipidemia, unspecified: Secondary | ICD-10-CM | POA: Diagnosis not present

## 2018-05-24 DIAGNOSIS — Z79899 Other long term (current) drug therapy: Secondary | ICD-10-CM | POA: Diagnosis not present

## 2018-05-24 MED ORDER — TRAMADOL HCL 50 MG PO TABS: ORAL_TABLET | ORAL | 5 refills | Status: DC

## 2018-05-24 MED ORDER — NAPROXEN 500 MG PO TABS: ORAL_TABLET | ORAL | 5 refills | Status: DC

## 2018-05-24 MED ORDER — HYDROCHLOROTHIAZIDE 25 MG PO TABS: 25.0000 mg | ORAL_TABLET | Freq: Every day | ORAL | 6 refills | Status: DC

## 2018-05-24 MED ORDER — LISINOPRIL 20 MG PO TABS: 20.0000 mg | ORAL_TABLET | Freq: Every day | ORAL | 6 refills | Status: DC

## 2018-05-24 NOTE — Progress Notes (Signed)
Subjective:    Patient ID: Susan Oneill, female    DOB: 11/08/1950, 67 y.o.   MRN: [id number]  Hypertension  This is a chronic problem. The current episode started more than 1 year ago. Pertinent negatives include no chest pain or shortness of breath. Risk factors for coronary artery disease include post-menopausal state. Treatments tried: lisinopril 20mg , hctz 25. There are no compliance problems.    No problems or concerns per patient. She is Xanax intermittently for anxiety denies abusing it.  Patient does have ongoing trouble with reflux.  Takes medication on a regular basis.  Tries to minimize foods as best they can.  They understand the importance of dietary compliance.  May also try to avoid eating a large meal close to bedtime.  Patient denies any dysphagia denies hematochezia.  States medicine does a good job keeping the problem under good control.  Without the medication may certainly have issues.They desire to continue taking their medication.  Patient has persistent joint pains in her back knees hips ankles feet she works very hard being on her feet bothers her a lot unable to do much in way of exercise and tolerates anti-inflammatory well  She does use tramadol when necessary to help her with her pain drug registry was checked was legitimate she denies abusing any medications she uses typically 2 or less per day but sometimes more than that  Review of Systems  Constitutional: Negative for activity change, appetite change and fatigue.  HENT: Negative for congestion and rhinorrhea.   Respiratory: Negative for cough and shortness of breath.   Cardiovascular: Negative for chest pain and leg swelling.  Gastrointestinal: Negative for abdominal pain and diarrhea.  Endocrine: Negative for polydipsia and polyphagia.  Skin: Negative for color change.  Neurological: Negative for dizziness and weakness.  Psychiatric/Behavioral: Negative for behavioral problems and confusion.       Objective:   Physical Exam  Constitutional: She appears well-nourished. No distress.  HENT:  Head: Normocephalic and atraumatic.  Eyes: Right eye exhibits no discharge. Left eye exhibits no discharge.  Neck: No tracheal deviation present.  Cardiovascular: Normal rate, regular rhythm and normal heart sounds.  No murmur heard. Pulmonary/Chest: Effort normal and breath sounds normal. No respiratory distress.  Musculoskeletal: She exhibits no edema.  Lymphadenopathy:    She has no cervical adenopathy.  Neurological: She is alert. Coordination normal.  Skin: Skin is warm and dry.  Psychiatric: She has a normal mood and affect. Her behavior is normal.  Vitals reviewed.         Assessment & Plan:  Chronic arthralgias and joint pains-osteoarthritis tramadol as needed for pain caution drowsiness several refills given follow-up in 6 months  Patient does have ongoing trouble with reflux.  Takes medication on a regular basis.  Tries to minimize foods as best they can.  They understand the importance of dietary compliance.  May also try to avoid eating a large meal close to bedtime.  Patient denies any dysphagia denies hematochezia.  States medicine does a good job keeping the problem under good control.  Without the medication may certainly have issues.They desire to continue taking their medication.  Chronic underlying anxiety anxiousness uses abuse it prescription given she needs further refills to notify us do not drive with medication  Does need lab work recent CBC showed white blood count was low will repeat CBC need to make sure that this is going back up  HTN- Patient was seen today as part of a visit regarding  hypertension. The importance of healthy diet and regular physical activity was discussed. The importance of compliance with medications discussed.  Ideal goal is to keep blood pressure low elevated levels certainly below 865/78 when possible.  The patient was counseled that  keeping blood pressure under control lessen his risk of complications.  The importance of regular follow-ups was discussed with the patient.  Low-salt diet such as DASH recommended.  Regular physical activity was recommended as well.  Patient was advised to keep regular follow-ups.  25 minutes was spent with the patient.  This statement verifies that 25 minutes was indeed spent with the patient.  More than 50% of this visit-total duration of the visit-was spent in counseling and coordination of care. The issues that the patient came in for today as reflected in the diagnosis (s) please refer to documentation for further details.  Patient encouraged to get female health checkup

## 2018-05-25 ENCOUNTER — Encounter: Payer: Self-pay | Admitting: Family Medicine

## 2018-05-25 LAB — CBC WITH DIFFERENTIAL/PLATELET
BASOS ABS: 0.1 10*3/uL (ref 0.0–0.2)
BASOS: 1 %
EOS (ABSOLUTE): 0.2 10*3/uL (ref 0.0–0.4)
Eos: 4 %
HEMOGLOBIN: 12.3 g/dL (ref 11.1–15.9)
Hematocrit: 36.7 % (ref 34.0–46.6)
IMMATURE GRANS (ABS): 0 10*3/uL (ref 0.0–0.1)
IMMATURE GRANULOCYTES: 0 %
LYMPHS: 26 %
Lymphocytes Absolute: 1.4 10*3/uL (ref 0.7–3.1)
MCH: 28 pg (ref 26.6–33.0)
MCHC: 33.5 g/dL (ref 31.5–35.7)
MCV: 84 fL (ref 79–97)
MONOCYTES: 9 %
Monocytes Absolute: 0.5 10*3/uL (ref 0.1–0.9)
NEUTROS PCT: 60 %
Neutrophils Absolute: 3.3 10*3/uL (ref 1.4–7.0)
PLATELETS: 265 10*3/uL (ref 150–450)
RBC: 4.39 x10E6/uL (ref 3.77–5.28)
RDW: 13.2 % (ref 12.3–15.4)
WBC: 5.5 10*3/uL (ref 3.4–10.8)

## 2018-05-25 LAB — LIPID PANEL
Chol/HDL Ratio: 3.2 ratio (ref 0.0–4.4)
Cholesterol, Total: 192 mg/dL (ref 100–199)
HDL: 60 mg/dL (ref >39)
LDL Calculated: 109 mg/dL — ABNORMAL HIGH (ref 0–99)
Triglycerides: 114 mg/dL (ref 0–149)
VLDL CHOLESTEROL CAL: 23 mg/dL (ref 5–40)

## 2018-05-25 LAB — BASIC METABOLIC PANEL
BUN/Creatinine Ratio: 25 (ref 12–28)
BUN: 18 mg/dL (ref 8–27)
CALCIUM: 9.1 mg/dL (ref 8.7–10.3)
CHLORIDE: 98 mmol/L (ref 96–106)
CO2: 23 mmol/L (ref 20–29)
CREATININE: 0.72 mg/dL (ref 0.57–1.00)
GFR calc Af Amer: 102 mL/min/{1.73_m2} (ref >59)
GFR calc non Af Amer: 89 mL/min/{1.73_m2} (ref >59)
GLUCOSE: 91 mg/dL (ref 65–99)
POTASSIUM: 4.2 mmol/L (ref 3.5–5.2)
SODIUM: 137 mmol/L (ref 134–144)

## 2018-05-25 LAB — HEPATIC FUNCTION PANEL
ALBUMIN: 4.4 g/dL (ref 3.6–4.8)
ALK PHOS: 84 IU/L (ref 39–117)
ALT: 12 IU/L (ref 0–32)
AST: 21 IU/L (ref 0–40)
BILIRUBIN TOTAL: 0.2 mg/dL (ref 0.0–1.2)
Bilirubin, Direct: 0.07 mg/dL (ref 0.00–0.40)
TOTAL PROTEIN: 6.7 g/dL (ref 6.0–8.5)

## 2018-07-11 ENCOUNTER — Telehealth: Payer: Self-pay | Admitting: Family Medicine

## 2018-07-11 MED ORDER — LISINOPRIL 20 MG PO TABS: 20.0000 mg | ORAL_TABLET | Freq: Every day | ORAL | 0 refills | Status: DC

## 2018-07-11 MED ORDER — HYDROCHLOROTHIAZIDE 25 MG PO TABS: 25.0000 mg | ORAL_TABLET | Freq: Every day | ORAL | 0 refills | Status: DC

## 2018-07-11 NOTE — Telephone Encounter (Signed)
Chronic medications(non controlled meds) sent electronically to mail order pharmacy. Patient notified.

## 2018-07-11 NOTE — Telephone Encounter (Signed)
error 

## 2018-07-11 NOTE — Telephone Encounter (Signed)
Mr.Agustin calling on behalf of pt to check on [facility] prescriptions that were faxed over today.   (RX requests are in nurse's RX box)  Everette Etienne not on DPR, Ethyl Guderian's number is listed for callback

## 2018-07-13 ENCOUNTER — Telehealth: Payer: Self-pay | Admitting: Family Medicine

## 2018-07-13 MED ORDER — NAPROXEN 500 MG PO TABS: ORAL_TABLET | ORAL | 1 refills | Status: DC

## 2018-07-13 NOTE — Telephone Encounter (Signed)
Fax from pharmacy requesting refill on naproxen 500 mg tablet. Take one tablet by mouth two times a day with a meal as needed for pain. Please advise. Thank you

## 2018-07-13 NOTE — Telephone Encounter (Signed)
May have 90-day with 1 refill

## 2018-07-13 NOTE — Telephone Encounter (Signed)
Prescription sent electronically to pharmacy. 

## 2018-07-13 NOTE — Addendum Note (Signed)
Addended by: Dairl Ponder on: 07/13/2018 01:36 PM   Modules accepted: Orders

## 2018-07-24 ENCOUNTER — Encounter: Payer: Self-pay | Admitting: Family Medicine

## 2018-07-24 ENCOUNTER — Ambulatory Visit (INDEPENDENT_AMBULATORY_CARE_PROVIDER_SITE_OTHER): Payer: Medicare HMO | Admitting: Family Medicine

## 2018-07-24 ENCOUNTER — Other Ambulatory Visit (HOSPITAL_COMMUNITY)
Admission: RE | Admit: 2018-07-24 | Discharge: 2018-07-24 | Disposition: A | Discharge status: Home / Self Care | Payer: Medicare HMO | Source: Ambulatory Visit | Attending: Family Medicine | Admitting: Family Medicine

## 2018-07-24 VITALS — BP 156/91 | HR 71 | Wt 227.0 lb

## 2018-07-24 DIAGNOSIS — N898 Other specified noninflammatory disorders of vagina: Secondary | ICD-10-CM | POA: Insufficient documentation

## 2018-07-24 DIAGNOSIS — N950 Postmenopausal bleeding: Secondary | ICD-10-CM

## 2018-07-24 DIAGNOSIS — N8111 Cystocele, midline: Secondary | ICD-10-CM | POA: Insufficient documentation

## 2018-07-24 DIAGNOSIS — Z124 Encounter for screening for malignant neoplasm of cervix: Secondary | ICD-10-CM | POA: Insufficient documentation

## 2018-07-24 DIAGNOSIS — N719 Inflammatory disease of uterus, unspecified: Secondary | ICD-10-CM | POA: Diagnosis not present

## 2018-07-24 DIAGNOSIS — Z01419 Encounter for gynecological examination (general) (routine) without abnormal findings: Secondary | ICD-10-CM | POA: Diagnosis not present

## 2018-07-24 NOTE — Progress Notes (Signed)
Need Mammogram  Need Pap  Vaginal discharge for several months Vaginal spotting for several months No family history of cancer

## 2018-07-24 NOTE — Assessment & Plan Note (Signed)
?   Is there some infection causing bleeding. R/o and treat accordingly

## 2018-07-24 NOTE — Assessment & Plan Note (Signed)
Check EMB and pelvic sonogram.

## 2018-07-24 NOTE — Patient Instructions (Signed)
Endometrial Biopsy, Care After  This sheet gives you information about how to care for yourself after your procedure. Your health care provider may also give you more specific instructions. If you have problems or questions, contact your health care provider.  What can I expect after the procedure?  After the procedure, it is common to have:  · Mild cramping.  · A small amount of vaginal bleeding for a few days. This is normal.  Follow these instructions at home:    · Take over-the-counter and prescription medicines only as told by your health care provider.  · Do not douche, use tampons, or have sexual intercourse until your health care provider approves.  · Return to your normal activities as told by your health care provider. Ask your health care provider what activities are safe for you.  · Follow instructions from your health care provider about any activity restrictions, such as restrictions on strenuous exercise or heavy lifting.  Contact a health care provider if:  · You have heavy bleeding, or bleed for longer than 2 days after the procedure.  · You have bad smelling discharge from your vagina.  · You have a fever or chills.  · You have a burning sensation when urinating or you have difficulty urinating.  · You have severe pain in your lower abdomen.  Get help right away if:  · You have severe cramps in your stomach or back.  · You pass large blood clots.  · Your bleeding increases.  · You become weak or light-headed, or you pass out.  Summary  · After the procedure, it is common to have mild cramping and a small amount of vaginal bleeding for a few days.  · Do not douche, use tampons, or have sexual intercourse until your health care provider approves.  · Return to your normal activities as told by your health care provider. Ask your health care provider what activities are safe for you.  This information is not intended to replace advice given to you by your health care provider. Make sure you discuss any  questions you have with your health care provider.  Document Released: 04/10/2013 Document Revised: 07/06/2016 Document Reviewed: 07/06/2016  Elsevier Interactive Patient Education © 2019 [employer]    //      Postmenopausal Bleeding                    Postmenopausal bleeding is any bleeding that happens after menopause. Menopause is when a woman's period stops. Any type of bleeding after menopause should be checked by your doctor. Treatment will depend on the cause.  Follow these instructions at home:  · Pay attention to any changes in your symptoms.  · Avoid using tampons and douches as told by your doctor.  · Change your pads regularly.  · Get regular pelvic exams and Pap tests.  · Take iron pills as told by your doctor.  · Take over-the-counter and prescription medicines only as told by your doctor.  · Keep all follow-up visits as told by your doctor. This is important.  Contact a doctor if:  · Your bleeding lasts for more than 1 week.  · You have belly (abdominal) pain.  · You have bleeding during or after sex (intercourse).  · You have bleeding that happens more often than every 3 weeks.  Get help right away if:  · You have a fever, chills, a headache, dizziness, muscle aches, and bleeding.  · You have strong   pain with bleeding.  · You have clumps of blood (blood clots) coming from your vagina.  · You have a lot of bleeding and:  ? You need more than 1 pad an hour.  ? This has never happened before.  · You feel like you are going to pass out (faint).  Summary  · Any type of bleeding after menopause should be checked by your doctor.  · Pay attention to any changes in your symptoms.  · Keep all follow-up visits as told by your doctor.  This information is not intended to replace advice given to you by your health care provider. Make sure you discuss any questions you have with your health care provider.  Document Released: 03/29/2008 Document Revised: 07/26/2016 Document Reviewed: 07/26/2016  Elsevier  Interactive Patient Education © 2019 [employer] Inc[employer]

## 2018-07-24 NOTE — Assessment & Plan Note (Signed)
Discussed, may need surgical repair, would likely send out for Uro/Gyn referral.

## 2018-07-24 NOTE — Progress Notes (Signed)
  Subjective:     Susan Oneill is a 68 y.o. female and is here for a comprehensive physical exam. The patient reports problems - mild spotting. Bleeding since September with vaginal discharge and spotting. Also notes something is hanging out x 1 year. Unclear if this is bladder or uterus. It is soft.goes and comes. SVD x 2 largest was 8 lb 3 oz. Denies a lot of urinary incontinence.  The following portions of the patient's history were reviewed and updated as appropriate: allergies, current medications, past family history, past medical history, past social history, past surgical history and problem list.  Review of Systems Pertinent items noted in HPI and remainder of comprehensive ROS otherwise negative.   Objective:    BP (!) 156/91   Pulse 71   Wt 227 lb (103 kg)   BMI 37.77 kg/m  General appearance: alert, cooperative, appears stated age and moderately obese Head: Normocephalic, without obvious abnormality, atraumatic Neck: no adenopathy, supple, symmetrical, trachea midline and thyroid not enlarged, symmetric, no tenderness/mass/nodules Lungs: clear to auscultation bilaterally Breasts: normal appearance, no masses or tenderness Heart: regular rate and rhythm, S1, S2 normal, no murmur, click, rub or gallop Abdomen: soft, non-tender; bowel sounds normal; no masses,  no organomegaly Pelvic: cervix normal in appearance, external genitalia normal, no adnexal masses or tenderness, no cervical motion tenderness and exam limited by body habitus, but uterus does not feel overly large, green discharge noted, large cystocele noted, several hemorrhoids noted Extremities: extremities normal, atraumatic, no cyanosis or edema Bunion on left foot Pulses: 2+ and symmetric Skin: Skin color, texture, turgor normal. No rashes or lesions Lymph nodes: Cervical, supraclavicular, and axillary nodes normal. Neurologic: Grossly normal    Procedure: Patient given informed consent, signed copy in the  chart, time out was performed. Appropriate time out taken. The patient was placed in the lithotomy position and the cervix brought into view with sterile speculum.  Portio of cervix cleansed x 2 with betadine swabs.  A tenaculum was placed in the anterior lip of the cervix. Os finder used to dilate closed cervix. The uterus was sounded for depth of 6 cm. A pipelle was introduced to into the uterus, suction created, and an endometrial sample was obtained.   Assessment:    GYN female exam.      Plan:   Problem List Items Addressed This Visit      Unprioritized   Female cystocele    Discussed, may need surgical repair, would likely send out for Uro/Gyn referral.      Postmenopausal bleeding - Primary    Check EMB and pelvic sonogram.      Relevant Orders   Surgical pathology( [facility])   US PELVIC COMPLETE WITH TRANSVAGINAL   Vaginal discharge    ? Is there some infection causing bleeding. R/o and treat accordingly      Relevant Orders   Cytology - PAP( [facility])   POCT Urinalysis Dipstick    Other Visit Diagnoses    Screening for malignant neoplasm of cervix       Relevant Orders   Cytology - PAP( [facility])   Encounter for gynecological examination without abnormal finding       Relevant Orders   MM 3D SCREEN BREAST BILATERAL     Return in 4 weeks (on 08/21/2018) for a follow-up.    See After Visit Summary for Counseling Recommendations

## 2018-07-26 ENCOUNTER — Telehealth: Payer: Self-pay

## 2018-07-26 NOTE — Telephone Encounter (Signed)
Spoke with patient concerning her benign biopsy result. Patient voice understanding.

## 2018-07-26 NOTE — Telephone Encounter (Signed)
-----   Message from Donnamae Jude, MD sent at 07/26/2018  4:16 PM EST ----- Her biopsy was benign, but minimal tissue. Give her these results. After the u/s we will decide if we need to get more tissue.

## 2018-07-27 ENCOUNTER — Other Ambulatory Visit: Payer: Self-pay | Admitting: Family Medicine

## 2018-07-27 LAB — CYTOLOGY - PAP
Bacterial vaginitis: NEGATIVE
Candida vaginitis: NEGATIVE
Chlamydia: NEGATIVE
DIAGNOSIS: NEGATIVE
HPV (WINDOPATH): NOT DETECTED
NEISSERIA GONORRHEA: NEGATIVE
Trichomonas: NEGATIVE

## 2018-07-31 ENCOUNTER — Ambulatory Visit (HOSPITAL_COMMUNITY)
Admission: RE | Admit: 2018-07-31 | Discharge: 2018-07-31 | Disposition: A | Discharge status: Home / Self Care | Payer: Medicare HMO | Source: Ambulatory Visit | Attending: Family Medicine | Admitting: Family Medicine

## 2018-07-31 DIAGNOSIS — D259 Leiomyoma of uterus, unspecified: Secondary | ICD-10-CM | POA: Diagnosis not present

## 2018-07-31 DIAGNOSIS — N950 Postmenopausal bleeding: Secondary | ICD-10-CM | POA: Diagnosis not present

## 2018-08-07 ENCOUNTER — Other Ambulatory Visit: Payer: Self-pay

## 2018-08-07 MED ORDER — MEGESTROL ACETATE 40 MG PO TABS: 40.0000 mg | ORAL_TABLET | Freq: Two times a day (BID) | ORAL | 0 refills | Status: DC

## 2018-08-07 NOTE — Telephone Encounter (Signed)
Dr.Pratt would like to patient to try Megace 40mg  BID for two weeks.

## 2018-08-13 ENCOUNTER — Ambulatory Visit (HOSPITAL_COMMUNITY)
Admission: RE | Admit: 2018-08-13 | Discharge: 2018-08-13 | Disposition: A | Discharge status: Home / Self Care | Payer: Medicare HMO | Source: Ambulatory Visit | Attending: Family Medicine | Admitting: Family Medicine

## 2018-08-13 DIAGNOSIS — Z1231 Encounter for screening mammogram for malignant neoplasm of breast: Secondary | ICD-10-CM | POA: Insufficient documentation

## 2018-08-13 DIAGNOSIS — Z01419 Encounter for gynecological examination (general) (routine) without abnormal findings: Secondary | ICD-10-CM | POA: Insufficient documentation

## 2018-08-14 ENCOUNTER — Other Ambulatory Visit (HOSPITAL_COMMUNITY): Payer: Self-pay | Admitting: Family Medicine

## 2018-08-14 DIAGNOSIS — R928 Other abnormal and inconclusive findings on diagnostic imaging of breast: Secondary | ICD-10-CM

## 2018-08-20 ENCOUNTER — Encounter: Payer: Self-pay | Admitting: Family Medicine

## 2018-08-20 ENCOUNTER — Ambulatory Visit (INDEPENDENT_AMBULATORY_CARE_PROVIDER_SITE_OTHER): Payer: Medicare HMO | Admitting: Family Medicine

## 2018-08-20 DIAGNOSIS — N950 Postmenopausal bleeding: Secondary | ICD-10-CM

## 2018-08-20 NOTE — Progress Notes (Signed)
   Subjective:    Patient ID: Susan Oneill is a 68 y.o. female presenting with Follow-up  on 08/20/2018  HPI: F/u from PMB. U/s shows 3.2 mm stripe, c/w benign etiology. EMB is negative, with scant endometrial tissue. Still with occasional spotting. Seems to happen at this time of day. After work or done something heavy. Has been on Megace x 1 wk, and seems to be lightening some. She is quite concerned about why she is bleeding and wants more definitive diagnosis and possible treatment.  Review of Systems  Constitutional: Negative for chills and fever.  Respiratory: Negative for shortness of breath.   Cardiovascular: Negative for chest pain.  Gastrointestinal: Negative for abdominal pain, nausea and vomiting.  Genitourinary: Negative for dysuria.  Skin: Negative for rash.      Objective:    BP (!) 167/82   Pulse 72   Wt 218 lb 12.8 oz (99.2 kg)   BMI 36.41 kg/m  Physical Exam Constitutional:      General: She is not in acute distress.    Appearance: She is well-developed.  HENT:     Head: Normocephalic and atraumatic.  Eyes:     General: No scleral icterus. Neck:     Musculoskeletal: Neck supple.  Cardiovascular:     Rate and Rhythm: Normal rate.  Pulmonary:     Effort: Pulmonary effort is normal.  Abdominal:     Palpations: Abdomen is soft.  Skin:    General: Skin is warm and dry.  Neurological:     Mental Status: She is alert and oriented to person, place, and time.        Assessment & Plan:   Problem List Items Addressed This Visit      Unprioritized   Postmenopausal bleeding    For more definitive diagnosis and effective treatment, will proceed with D and C with Hysteroscopy. Risks include but are not limited to bleeding, infection, injury to surrounding structures, including bowel, bladder and ureters, blood clots, and death.  Likelihood of success is high.          Total face-to-face time with patient: 15 minutes. Over 50% of encounter was  spent on counseling and coordination of care. Return in about 3 months (around 11/18/2018) for postop check.  Susan Oneill 08/20/2018 4:15 PM

## 2018-08-21 ENCOUNTER — Encounter: Payer: Self-pay | Admitting: Family Medicine

## 2018-08-21 ENCOUNTER — Encounter (HOSPITAL_COMMUNITY): Payer: Self-pay

## 2018-08-21 NOTE — Assessment & Plan Note (Signed)
For more definitive diagnosis and effective treatment, will proceed with D and C with Hysteroscopy. Risks include but are not limited to bleeding, infection, injury to surrounding structures, including bowel, bladder and ureters, blood clots, and death.  Likelihood of success is high.

## 2018-08-27 ENCOUNTER — Other Ambulatory Visit: Payer: Self-pay | Admitting: Family Medicine

## 2018-08-27 ENCOUNTER — Other Ambulatory Visit: Payer: Self-pay

## 2018-08-27 ENCOUNTER — Encounter: Payer: Self-pay | Admitting: Radiology

## 2018-08-27 ENCOUNTER — Telehealth: Payer: Self-pay | Admitting: Family Medicine

## 2018-08-27 MED ORDER — MEGESTROL ACETATE 40 MG PO TABS: 40.0000 mg | ORAL_TABLET | Freq: Two times a day (BID) | ORAL | 1 refills | Status: DC

## 2018-08-27 NOTE — Telephone Encounter (Signed)
Patient is requesting a refill on megestrol.

## 2018-08-27 NOTE — Telephone Encounter (Signed)
Awaiting signature.

## 2018-08-27 NOTE — Telephone Encounter (Signed)
May have this +2 refills needs office visit

## 2018-08-27 NOTE — Telephone Encounter (Signed)
Checking on status of refill.

## 2018-08-28 ENCOUNTER — Ambulatory Visit (HOSPITAL_COMMUNITY)
Admission: RE | Admit: 2018-08-28 | Discharge: 2018-08-28 | Disposition: A | Discharge status: Home / Self Care | Payer: Medicare HMO | Source: Ambulatory Visit | Attending: Family Medicine | Admitting: Family Medicine

## 2018-08-28 ENCOUNTER — Ambulatory Visit (HOSPITAL_COMMUNITY): Payer: Medicare HMO

## 2018-08-28 DIAGNOSIS — N6011 Diffuse cystic mastopathy of right breast: Secondary | ICD-10-CM | POA: Diagnosis not present

## 2018-08-28 DIAGNOSIS — R921 Mammographic calcification found on diagnostic imaging of breast: Secondary | ICD-10-CM | POA: Diagnosis not present

## 2018-08-28 DIAGNOSIS — R928 Other abnormal and inconclusive findings on diagnostic imaging of breast: Secondary | ICD-10-CM | POA: Diagnosis not present

## 2018-08-28 NOTE — Telephone Encounter (Signed)
Patient aware we sent to the drug store.

## 2018-09-06 ENCOUNTER — Other Ambulatory Visit: Payer: Self-pay

## 2018-09-06 ENCOUNTER — Encounter (HOSPITAL_BASED_OUTPATIENT_CLINIC_OR_DEPARTMENT_OTHER): Payer: Self-pay

## 2018-09-08 NOTE — H&P (Signed)
Susan Oneill is an 68 y.o. [id number] female.    Chief Complaint: postmenopausal bleeding  HPI: Has known PMB. Negative w/u including endometrial sampling and u/s which shows endometrial stripe of 3.2 mm. Patient placed on Megace and bleeding has persisted and she would like treatment + expanded diagnosis with further testing.  Past Medical History:  Diagnosis Date  . Acid reflux   . Anxiety    xanax as needed  . Arthritis    oa and pain both knees  . Atypical chest pain 5 / 2014   DISCHARGE SUMMARY [place] - RULLED OUT FOR MI, NEGATIVE NUCLEAR STRESS TEST - PAIN ATTRIBUTED TO PT'S GERD- PT STATES NO FURTHER CHEST PAINS - [facility] HAS HELPED PAIN  . Cyst in hand    ?? CYST BACK OF LEFT HAND - PT STATES THE AREA POPPED UP - RAISED AREA - SOMETIMES PAINFUL - HAS A BRACE TO WEAR ON THAT HAND WHEN SLEEPING  . Hemorrhoids   . HTN (hypertension)   . Hyperlipidemia   . Tinnitus   . Transfusion history 1978   after childbirth - not all of placenta passed and pt started bleeding  . Urticaria   . Uterine fibroid    no problems from the fibroids  . Varicose veins     Past Surgical History:  Procedure Laterality Date  . CARPAL TUNNEL RELEASE     bilateral  . HEMORROIDECTOMY    . HERNIA REPAIR  7628   umbilical hernia repair  . KNEE SURGERY     bilateral knee arthroscopy- rt knee sept 2011, left knee march 2010  . TOTAL KNEE ARTHROPLASTY Right 10/21/2013   Procedure: RIGHT TOTAL KNEE ARTHROPLASTY;  Surgeon: Gearlean Alf, MD;  Location: [facility];  Service: Orthopedics;  Laterality: Right;    Family History  Problem Relation Age of Onset  . Arthritis Other   . Heart disease Other    Social History:  reports that she has never smoked. She has never used smokeless tobacco. She reports that she does not drink alcohol or use drugs.  Allergies:  Allergies  Allergen Reactions  . Amoxicillin Nausea And Vomiting and Rash  . Augmentin [Amoxicillin-Pot Clavulanate] Nausea And Vomiting      . Ciprofloxacin Nausea And Vomiting  . Codeine Nausea And Vomiting and Rash  . Mobic [Meloxicam] Other (See Comments)    Constipation  . Sulfonamide Derivatives Nausea And Vomiting and Rash    No medications prior to admission.    A comprehensive review of systems was negative.  Height 5\' 5"  (1.651 m), weight 100.2 kg. General appearance: alert, cooperative and appears stated age Head: Normocephalic, without obvious abnormality, atraumatic Neck: supple, symmetrical, trachea midline Lungs: normal effort Heart: regular rate and rhythm Abdomen: soft, non-tender; bowel sounds normal; no masses,  no organomegaly Extremities: extremities normal, atraumatic, no cyanosis or edema Skin: Skin color, texture, turgor normal. No rashes or lesions Neurologic: Grossly normal   Lab Results  Component Value Date   WBC 5.5 05/24/2018   HGB 12.3 05/24/2018   HCT 36.7 05/24/2018   MCV 84 05/24/2018   PLT 265 05/24/2018   No results found for: PREGTESTUR, PREGSERUM, HCG, HCGQUANT   Assessment/Plan Principal Problem:   Postmenopausal bleeding  For Dilation and Curettage with Hysteroscopy Risks include but are not limited to bleeding, infection, injury to surrounding structures, including bowel, bladder and ureters, blood clots, and death.  Likelihood of success is high in obtaining sample.    Susan Oneill 09/08/2018, 2:12  PM

## 2018-09-10 ENCOUNTER — Encounter (HOSPITAL_BASED_OUTPATIENT_CLINIC_OR_DEPARTMENT_OTHER)
Admission: RE | Admit: 2018-09-10 | Discharge: 2018-09-10 | Disposition: A | Discharge status: Home / Self Care | Payer: Medicare HMO | Source: Ambulatory Visit | Attending: Family Medicine | Admitting: Family Medicine

## 2018-09-10 DIAGNOSIS — Z880 Allergy status to penicillin: Secondary | ICD-10-CM | POA: Diagnosis not present

## 2018-09-10 DIAGNOSIS — F419 Anxiety disorder, unspecified: Secondary | ICD-10-CM | POA: Diagnosis not present

## 2018-09-10 DIAGNOSIS — Z79891 Long term (current) use of opiate analgesic: Secondary | ICD-10-CM | POA: Diagnosis not present

## 2018-09-10 DIAGNOSIS — Z7982 Long term (current) use of aspirin: Secondary | ICD-10-CM | POA: Diagnosis not present

## 2018-09-10 DIAGNOSIS — Z885 Allergy status to narcotic agent status: Secondary | ICD-10-CM | POA: Diagnosis not present

## 2018-09-10 DIAGNOSIS — Z886 Allergy status to analgesic agent status: Secondary | ICD-10-CM | POA: Diagnosis not present

## 2018-09-10 DIAGNOSIS — Z79899 Other long term (current) drug therapy: Secondary | ICD-10-CM | POA: Diagnosis not present

## 2018-09-10 DIAGNOSIS — K219 Gastro-esophageal reflux disease without esophagitis: Secondary | ICD-10-CM | POA: Diagnosis not present

## 2018-09-10 DIAGNOSIS — N858 Other specified noninflammatory disorders of uterus: Secondary | ICD-10-CM | POA: Diagnosis not present

## 2018-09-10 DIAGNOSIS — M170 Bilateral primary osteoarthritis of knee: Secondary | ICD-10-CM | POA: Diagnosis not present

## 2018-09-10 DIAGNOSIS — I10 Essential (primary) hypertension: Secondary | ICD-10-CM | POA: Diagnosis not present

## 2018-09-10 DIAGNOSIS — Z881 Allergy status to other antibiotic agents status: Secondary | ICD-10-CM | POA: Diagnosis not present

## 2018-09-10 DIAGNOSIS — Z882 Allergy status to sulfonamides status: Secondary | ICD-10-CM | POA: Diagnosis not present

## 2018-09-10 DIAGNOSIS — Z96651 Presence of right artificial knee joint: Secondary | ICD-10-CM | POA: Diagnosis not present

## 2018-09-10 DIAGNOSIS — N950 Postmenopausal bleeding: Secondary | ICD-10-CM | POA: Diagnosis present

## 2018-09-10 LAB — BASIC METABOLIC PANEL
Anion gap: 5 (ref 5–15)
BUN: 15 mg/dL (ref 8–23)
CHLORIDE: 106 mmol/L (ref 98–111)
CO2: 25 mmol/L (ref 22–32)
Calcium: 9 mg/dL (ref 8.9–10.3)
Creatinine, Ser: 0.85 mg/dL (ref 0.44–1.00)
GFR calc Af Amer: >60 mL/min (ref >60)
GFR calc non Af Amer: >60 mL/min (ref >60)
Glucose, Bld: 97 mg/dL (ref 70–99)
Potassium: 3.8 mmol/L (ref 3.5–5.1)
Sodium: 136 mmol/L (ref 135–145)

## 2018-09-10 LAB — CBC
HCT: 38.1 % (ref 36.0–46.0)
HEMOGLOBIN: 11.9 g/dL — AB (ref 12.0–15.0)
MCH: 26.9 pg (ref 26.0–34.0)
MCHC: 31.2 g/dL (ref 30.0–36.0)
MCV: 86 fL (ref 80.0–100.0)
Platelets: 234 10*3/uL (ref 150–400)
RBC: 4.43 MIL/uL (ref 3.87–5.11)
RDW: 14.4 % (ref 11.5–15.5)
WBC: 4.5 10*3/uL (ref 4.0–10.5)
nRBC: 0 % (ref 0.0–0.2)

## 2018-09-10 NOTE — Progress Notes (Signed)
Ensure pre surgery drink given with instructions to complete by 0430 dos, pt verbalized understanding. 

## 2018-09-10 NOTE — Anesthesia Preprocedure Evaluation (Addendum)
Anesthesia Evaluation  Patient identified by MRN, date of birth, ID band Patient awake    Reviewed: Allergy & Precautions, NPO status , Patient's Chart, lab work & pertinent test results  Airway Mallampati: II  TM Distance: >3 FB Neck ROM: Full    Dental no notable dental hx. (+) Teeth Intact, Dental Advisory Given   Pulmonary neg pulmonary ROS,    Pulmonary exam normal breath sounds clear to auscultation       Cardiovascular hypertension, Pt. on medications Normal cardiovascular exam Rhythm:Regular Rate:Normal  09/10/2018 Ekg  SR R 74 W occ pvs   Neuro/Psych Anxiety negative neurological ROS     GI/Hepatic GERD  Medicated,  Endo/Other    Renal/GU Cr 0.85     Musculoskeletal  (+) Arthritis ,   Abdominal (+) + obese,   Peds  Hematology Hgb 11.9 Plt 234   Anesthesia Other Findings   Reproductive/Obstetrics                            Anesthesia Physical Anesthesia Plan  ASA: II  Anesthesia Plan: General   Post-op Pain Management:    Induction: Intravenous  PONV Risk Score and Plan: 2 and Treatment may vary due to age or medical condition, Ondansetron and Dexamethasone  Airway Management Planned: LMA  Additional Equipment:   Intra-op Plan:   Post-operative Plan:   Informed Consent: I have reviewed the patients History and Physical, chart, labs and discussed the procedure including the risks, benefits and alternatives for the proposed anesthesia with the patient or authorized representative who has indicated his/her understanding and acceptance.     Dental advisory given  Plan Discussed with:   Anesthesia Plan Comments: (D& C Hysteroscopy w LMA)        Anesthesia Quick Evaluation

## 2018-09-11 ENCOUNTER — Encounter (HOSPITAL_BASED_OUTPATIENT_CLINIC_OR_DEPARTMENT_OTHER)
Admission: RE | Disposition: A | Discharge status: Home / Self Care | Payer: Self-pay | Source: Home / Self Care | Attending: Family Medicine

## 2018-09-11 ENCOUNTER — Ambulatory Visit (HOSPITAL_BASED_OUTPATIENT_CLINIC_OR_DEPARTMENT_OTHER): Payer: Medicare HMO | Admitting: Anesthesiology

## 2018-09-11 ENCOUNTER — Ambulatory Visit (HOSPITAL_BASED_OUTPATIENT_CLINIC_OR_DEPARTMENT_OTHER)
Admission: RE | Admit: 2018-09-11 | Discharge: 2018-09-11 | Disposition: A | Discharge status: Home / Self Care | Payer: Medicare HMO | Attending: Family Medicine | Admitting: Family Medicine

## 2018-09-11 ENCOUNTER — Ambulatory Visit (HOSPITAL_BASED_OUTPATIENT_CLINIC_OR_DEPARTMENT_OTHER): Admit: 2018-09-11 | Payer: Medicare HMO | Admitting: Family Medicine

## 2018-09-11 ENCOUNTER — Other Ambulatory Visit: Payer: Self-pay

## 2018-09-11 ENCOUNTER — Encounter (HOSPITAL_BASED_OUTPATIENT_CLINIC_OR_DEPARTMENT_OTHER): Payer: Self-pay

## 2018-09-11 DIAGNOSIS — I10 Essential (primary) hypertension: Secondary | ICD-10-CM | POA: Diagnosis not present

## 2018-09-11 DIAGNOSIS — Z79891 Long term (current) use of opiate analgesic: Secondary | ICD-10-CM | POA: Insufficient documentation

## 2018-09-11 DIAGNOSIS — Z96651 Presence of right artificial knee joint: Secondary | ICD-10-CM | POA: Diagnosis not present

## 2018-09-11 DIAGNOSIS — N950 Postmenopausal bleeding: Secondary | ICD-10-CM

## 2018-09-11 DIAGNOSIS — Z79899 Other long term (current) drug therapy: Secondary | ICD-10-CM | POA: Diagnosis not present

## 2018-09-11 DIAGNOSIS — Z880 Allergy status to penicillin: Secondary | ICD-10-CM | POA: Insufficient documentation

## 2018-09-11 DIAGNOSIS — M170 Bilateral primary osteoarthritis of knee: Secondary | ICD-10-CM | POA: Insufficient documentation

## 2018-09-11 DIAGNOSIS — K219 Gastro-esophageal reflux disease without esophagitis: Secondary | ICD-10-CM | POA: Insufficient documentation

## 2018-09-11 DIAGNOSIS — Z882 Allergy status to sulfonamides status: Secondary | ICD-10-CM | POA: Insufficient documentation

## 2018-09-11 DIAGNOSIS — F419 Anxiety disorder, unspecified: Secondary | ICD-10-CM | POA: Diagnosis not present

## 2018-09-11 DIAGNOSIS — E785 Hyperlipidemia, unspecified: Secondary | ICD-10-CM | POA: Diagnosis not present

## 2018-09-11 DIAGNOSIS — N8110 Cystocele, unspecified: Secondary | ICD-10-CM | POA: Diagnosis not present

## 2018-09-11 DIAGNOSIS — N858 Other specified noninflammatory disorders of uterus: Secondary | ICD-10-CM | POA: Diagnosis not present

## 2018-09-11 DIAGNOSIS — Z886 Allergy status to analgesic agent status: Secondary | ICD-10-CM | POA: Insufficient documentation

## 2018-09-11 DIAGNOSIS — Z885 Allergy status to narcotic agent status: Secondary | ICD-10-CM | POA: Insufficient documentation

## 2018-09-11 DIAGNOSIS — Z881 Allergy status to other antibiotic agents status: Secondary | ICD-10-CM | POA: Insufficient documentation

## 2018-09-11 DIAGNOSIS — Z7982 Long term (current) use of aspirin: Secondary | ICD-10-CM | POA: Insufficient documentation

## 2018-09-11 HISTORY — DX: Anxiety disorder, unspecified: F41.9

## 2018-09-11 HISTORY — PX: DILATATION & CURETTAGE/HYSTEROSCOPY WITH MYOSURE: SHX6511

## 2018-09-11 SURGERY — DILATATION & CURETTAGE/HYSTEROSCOPY WITH MYOSURE
Anesthesia: General | Site: Vagina

## 2018-09-11 SURGERY — DILATATION AND CURETTAGE /HYSTEROSCOPY
Anesthesia: Choice

## 2018-09-11 MED ORDER — ACETAMINOPHEN 500 MG PO TABS
ORAL_TABLET | ORAL | Status: AC
  Filled 2018-09-11: qty 2

## 2018-09-11 MED ORDER — DEXAMETHASONE SODIUM PHOSPHATE 10 MG/ML IJ SOLN
INTRAMUSCULAR | Status: AC
  Filled 2018-09-11: qty 1

## 2018-09-11 MED ORDER — PROPOFOL 10 MG/ML IV BOLUS
INTRAVENOUS | Status: AC
  Filled 2018-09-11: qty 20

## 2018-09-11 MED ORDER — KETOROLAC TROMETHAMINE 30 MG/ML IJ SOLN
INTRAMUSCULAR | Status: DC | PRN
  Administered 2018-09-11: 30 mg via INTRAVENOUS

## 2018-09-11 MED ORDER — MEPERIDINE HCL 25 MG/ML IJ SOLN: 6.2500 mg | INTRAMUSCULAR | Status: DC | PRN

## 2018-09-11 MED ORDER — FENTANYL CITRATE (PF) 100 MCG/2ML IJ SOLN
50.0000 ug | INTRAMUSCULAR | Status: DC | PRN
  Administered 2018-09-11: 100 ug via INTRAVENOUS

## 2018-09-11 MED ORDER — KETOROLAC TROMETHAMINE 30 MG/ML IJ SOLN: 30.0000 mg | Freq: Once | INTRAMUSCULAR | Status: DC | PRN

## 2018-09-11 MED ORDER — SODIUM CHLORIDE 0.9 % IR SOLN
Status: DC | PRN
  Administered 2018-09-11: 3000 mL

## 2018-09-11 MED ORDER — KETOROLAC TROMETHAMINE 30 MG/ML IJ SOLN
INTRAMUSCULAR | Status: AC
  Filled 2018-09-11: qty 1

## 2018-09-11 MED ORDER — FENTANYL CITRATE (PF) 100 MCG/2ML IJ SOLN: 25.0000 ug | INTRAMUSCULAR | Status: DC | PRN

## 2018-09-11 MED ORDER — PROPOFOL 10 MG/ML IV BOLUS
INTRAVENOUS | Status: DC | PRN
  Administered 2018-09-11: 200 mg via INTRAVENOUS

## 2018-09-11 MED ORDER — LACTATED RINGERS IV SOLN: INTRAVENOUS | Status: DC

## 2018-09-11 MED ORDER — ONDANSETRON HCL 4 MG/2ML IJ SOLN
INTRAMUSCULAR | Status: AC
  Filled 2018-09-11: qty 2

## 2018-09-11 MED ORDER — BUPIVACAINE-EPINEPHRINE 0.25% -1:200000 IJ SOLN
INTRAMUSCULAR | Status: DC | PRN
  Administered 2018-09-11: 20 mL

## 2018-09-11 MED ORDER — LACTATED RINGERS IV SOLN
INTRAVENOUS | Status: DC
  Administered 2018-09-11: 08:00:00 via INTRAVENOUS

## 2018-09-11 MED ORDER — SCOPOLAMINE 1 MG/3DAYS TD PT72: 1.0000 | MEDICATED_PATCH | Freq: Once | TRANSDERMAL | Status: DC | PRN

## 2018-09-11 MED ORDER — ACETAMINOPHEN 500 MG PO TABS
1000.0000 mg | ORAL_TABLET | Freq: Once | ORAL | Status: AC
  Administered 2018-09-11: 1000 mg via ORAL

## 2018-09-11 MED ORDER — MIDAZOLAM HCL 2 MG/2ML IJ SOLN
INTRAMUSCULAR | Status: AC
  Filled 2018-09-11: qty 2

## 2018-09-11 MED ORDER — MIDAZOLAM HCL 2 MG/2ML IJ SOLN
1.0000 mg | INTRAMUSCULAR | Status: DC | PRN
  Administered 2018-09-11: 2 mg via INTRAVENOUS

## 2018-09-11 MED ORDER — GABAPENTIN 300 MG PO CAPS
300.0000 mg | ORAL_CAPSULE | Freq: Once | ORAL | Status: AC
  Administered 2018-09-11: 300 mg via ORAL

## 2018-09-11 MED ORDER — ONDANSETRON HCL 4 MG/2ML IJ SOLN
INTRAMUSCULAR | Status: DC | PRN
  Administered 2018-09-11: 4 mg via INTRAVENOUS

## 2018-09-11 MED ORDER — DEXAMETHASONE SODIUM PHOSPHATE 10 MG/ML IJ SOLN
INTRAMUSCULAR | Status: DC | PRN
  Administered 2018-09-11: 4 mg via INTRAVENOUS

## 2018-09-11 MED ORDER — ONDANSETRON HCL 4 MG/2ML IJ SOLN: 4.0000 mg | Freq: Once | INTRAMUSCULAR | Status: DC | PRN

## 2018-09-11 MED ORDER — GABAPENTIN 300 MG PO CAPS
ORAL_CAPSULE | ORAL | Status: AC
  Filled 2018-09-11: qty 1

## 2018-09-11 MED ORDER — OXYCODONE HCL 5 MG/5ML PO SOLN: 5.0000 mg | Freq: Once | ORAL | Status: DC | PRN

## 2018-09-11 MED ORDER — OXYCODONE HCL 5 MG PO TABS: 5.0000 mg | ORAL_TABLET | Freq: Once | ORAL | Status: DC | PRN

## 2018-09-11 MED ORDER — FENTANYL CITRATE (PF) 100 MCG/2ML IJ SOLN
INTRAMUSCULAR | Status: AC
  Filled 2018-09-11: qty 2

## 2018-09-11 SURGICAL SUPPLY — 18 items
BIPOLAR CUTTING LOOP 21FR (ELECTRODE)
BRIEF STRETCH FOR OB PAD XXL (UNDERPADS AND DIAPERS) ×3 IMPLANT
CANISTER SUCT 3000ML PPV (MISCELLANEOUS) ×3 IMPLANT
DEVICE MYOSURE LITE (MISCELLANEOUS) ×2 IMPLANT
ELECT REM PT RETURN 9FT ADLT (ELECTROSURGICAL)
ELECTRODE REM PT RTRN 9FT ADLT (ELECTROSURGICAL) IMPLANT
GLOVE BIOGEL PI IND STRL 7.0 (GLOVE) ×5 IMPLANT
GLOVE BIOGEL PI INDICATOR 7.0 (GLOVE) ×3
GLOVE SURG SS PI 7.0 STRL IVOR (GLOVE) ×4 IMPLANT
GOWN STRL REUS W/TWL LRG LVL3 (GOWN DISPOSABLE) ×6 IMPLANT
KIT PROCEDURE FLUENT (KITS) ×3 IMPLANT
LOOP CUTTING BIPOLAR 21FR (ELECTRODE) IMPLANT
PACK VAGINAL MINOR WOMEN LF (CUSTOM PROCEDURE TRAY) ×3 IMPLANT
PAD OB MATERNITY 4.3X12.25 (PERSONAL CARE ITEMS) ×3 IMPLANT
PAD PREP 24X48 CUFFED NSTRL (MISCELLANEOUS) ×3 IMPLANT
SEAL ROD LENS SCOPE MYOSURE (ABLATOR) ×2 IMPLANT
SLEEVE SCD COMPRESS KNEE MED (MISCELLANEOUS) ×3 IMPLANT
TOWEL GREEN STERILE FF (TOWEL DISPOSABLE) ×6 IMPLANT

## 2018-09-11 NOTE — Discharge Instructions (Signed)
Hysteroscopy, Care After This sheet gives you information about how to care for yourself after your procedure. Your health care provider may also give you more specific instructions. If you have problems or questions, contact your health care provider. What can I expect after the procedure? After the procedure, it is common to have:  Cramping.  Bleeding. This can vary from light spotting to menstrual-like bleeding. Follow these instructions at home: Activity  Rest for 1-2 days after the procedure.  Do not douche, use tampons, or have sex for 2 weeks after the procedure, or until your health care provider approves.  Do not drive for 24 hours after the procedure, or for as long as told by your health care provider.  Do not drive, use heavy machinery, or drink alcohol while taking prescription pain medicines. Medicines   Take over-the-counter and prescription medicines only as told by your health care provider.  Do not take aspirin during recovery. It can increase the risk of bleeding. General instructions  Do not take baths, swim, or use a hot tub until your health care provider approves. Take showers instead of baths for 2 weeks, or for as long as told by your health care provider.  To prevent or treat constipation while you are taking prescription pain medicine, your health care provider may recommend that you: ? Drink enough fluid to keep your urine clear or pale yellow. ? Take over-the-counter or prescription medicines. ? Eat foods that are high in fiber, such as fresh fruits and vegetables, whole grains, and beans. ? Limit foods that are high in fat and processed sugars, such as fried and sweet foods.  Keep all follow-up visits as told by your health care provider. This is important. Contact a health care provider if:  You feel dizzy or lightheaded.  You feel nauseous.  You have abnormal vaginal discharge.  You have a rash.  You have pain that does not get better with  medicine.  You have chills. Get help right away if:  You have bleeding that is heavier than a normal menstrual period.  You have a fever.  You have pain or cramps that get worse.  You develop new abdominal pain.  You faint.  You have pain in your shoulders.  You have shortness of breath. Summary  After the procedure, you may have cramping and some vaginal bleeding.  Do not douche, use tampons, or have sex for 2 weeks after the procedure, or until your health care provider approves.  Do not take baths, swim, or use a hot tub until your health care provider approves. Take showers instead of baths for 2 weeks, or for as long as told by your health care provider.  Report any unusual symptoms to your health care provider.  Keep all follow-up visits as told by your health care provider. This is important. This information is not intended to replace advice given to you by your health care provider. Make sure you discuss any questions you have with your health care provider. Document Released: 04/10/2013 Document Revised: 07/19/2016 Document Reviewed: 07/19/2016 Elsevier Interactive Patient Education  2019 [facility] Instructions  NO TYLENOL UNTIL AFTER 2:00PM ON 09/10/17. NO IBUPROFEN UNTIL AFTER 3:00PM ON 09/10/17.  Activity: Get plenty of rest for the remainder of the day. A responsible individual must stay with you for 24 hours following the procedure.  For the next 24 hours, DO NOT: -Drive a car -Paediatric nurse -Drink alcoholic beverages -Take any  medication unless instructed by your physician -Make any legal decisions or sign important papers.  Meals: Start with liquid foods such as gelatin or soup. Progress to regular foods as tolerated. Avoid greasy, spicy, heavy foods. If nausea and/or vomiting occur, drink only clear liquids until the nausea and/or vomiting subsides. Call your physician if vomiting continues.  Special  Instructions/Symptoms: Your throat may feel dry or sore from the anesthesia or the breathing tube placed in your throat during surgery. If this causes discomfort, gargle with warm salt water. The discomfort should disappear within 24 hours.  If you had a scopolamine patch placed behind your ear for the management of post- operative nausea and/or vomiting:  1. The medication in the patch is effective for 72 hours, after which it should be removed.  Wrap patch in a tissue and discard in the trash. Wash hands thoroughly with soap and water. 2. You may remove the patch earlier than 72 hours if you experience unpleasant side effects which may include dry mouth, dizziness or visual disturbances. 3. Avoid touching the patch. Wash your hands with soap and water after contact with the patch.

## 2018-09-11 NOTE — Op Note (Signed)
Preoperative diagnoses:  Postmenopausal bleeding  Postoperative diagnosis: Same  Procedure: D & C, hysteroscopy with Myosure  Surgeon: Standley Dakins. Kennon Rounds, MD  Anesthesia: Barnet Glasgow, MD  Findings:  Thin atrophic endometrium, large cystocele  Fluid deficit: 20 cc  Estimated blood loss: Minimum  Pathology: Endometrial curettings  Indications for procedure: 68 y.o. [id number] with history of postmenopausal bleeding who presents for  Procedure: The patient was taken to the operating room where spinal analgesia was inserted. SCDs were in place.  Time out was performed. Patient was placed in dorsolithotomy in [place]. She was prepped and draped in the usual sterile fashion. A Red Rubber catheter was used to drain her bladder. A speculum was placeed in the vagina. The cervix was visualized anteriorly and grasped with a single-tooth tenaculum. Paracervical block was performed with 1% lidocaine plain with 20 cc injected.  Sequential dilation was performed with Kennon Rounds dilators. The hysteroscope was inserted and the endometrial cavity and inspected. There were above findings noted in the endometrial cavity with both ostia seen. The Myosure device used to obtain adequate sampling of endometrium. The hysteroscope was removed. Attempts at sharp curettage were unable to be completed due to angle of uterus. All instruments were removed from the vagina. All instrument, needle and lap counts were correct x2. The patient was awakened and is recovering in stable condition.  Donnamae Jude MD 09/11/2018 8:55 AM

## 2018-09-11 NOTE — Anesthesia Postprocedure Evaluation (Signed)
Anesthesia Post Note  Patient: Susan Oneill  Procedure(s) Performed: DILATATION & CURETTAGE/HYSTEROSCOPY WITH MYOSURE (N/A Vagina )     Patient location during evaluation: PACU Anesthesia Type: General Level of consciousness: awake and alert Pain management: pain level controlled Vital Signs Assessment: post-procedure vital signs reviewed and stable Respiratory status: spontaneous breathing, nonlabored ventilation, respiratory function stable and patient connected to nasal cannula oxygen Cardiovascular status: blood pressure returned to baseline and stable Postop Assessment: no apparent nausea or vomiting Anesthetic complications: no    Last Vitals:  Vitals:   09/11/18 0930 09/11/18 0954  BP: (!) 146/80 (!) 148/76  Pulse: 67 79  Resp: 16 16  Temp:  36.5 C  SpO2: 99% 100%    Last Pain:  Vitals:   09/11/18 0954  TempSrc: Oral  PainSc: 0-No pain   Pain Goal:                   Barnet Glasgow

## 2018-09-11 NOTE — Interval H&P Note (Signed)
History and Physical Interval Note:  09/11/2018 8:11 AM  Susan Oneill  has presented today for surgery, with the diagnosis of PMB.  The various methods of treatment have been discussed with the patient and family. After consideration of risks, benefits and other options for treatment, the patient has consented to  Procedure(s): DILATATION AND CURETTAGE /HYSTEROSCOPY (N/A) as a surgical intervention.  The patient's history has been reviewed, patient examined, no change in status, stable for surgery.  I have reviewed the patient's chart and labs.  Questions were answered to the patient's satisfaction.     Donnamae Jude

## 2018-09-11 NOTE — Transfer of Care (Signed)
Immediate Anesthesia Transfer of Care Note  Patient: Susan Oneill  Procedure(s) Performed: DILATATION & CURETTAGE/HYSTEROSCOPY WITH MYOSURE (N/A Vagina )  Patient Location: PACU  Anesthesia Type:General  Level of Consciousness: awake, alert  and oriented  Airway & Oxygen Therapy: Patient Spontanous Breathing and Patient connected to face mask oxygen  Post-op Assessment: Report given to RN and Post -op Vital signs reviewed and stable  Post vital signs: Reviewed and stable  Last Vitals:  Vitals Value Taken Time  BP    Temp    Pulse 82 09/11/2018  9:04 AM  Resp    SpO2 98 % 09/11/2018  9:04 AM  Vitals shown include unvalidated device data.  Last Pain:  Vitals:   09/11/18 0804  TempSrc: Oral  PainSc: 0-No pain         Complications: No apparent anesthesia complications

## 2018-09-11 NOTE — Anesthesia Procedure Notes (Signed)
Procedure Name: LMA Insertion Date/Time: 09/11/2018 8:31 AM Performed by: Jonna Munro, CRNA Pre-anesthesia Checklist: Patient identified, Emergency Drugs available, Suction available, Patient being monitored and Timeout performed Patient Re-evaluated:Patient Re-evaluated prior to induction Oxygen Delivery Method: Circle system utilized Preoxygenation: Pre-oxygenation with 100% oxygen Induction Type: IV induction LMA: LMA inserted LMA Size: 4.0 Number of attempts: 1 Dental Injury: Teeth and Oropharynx as per pre-operative assessment

## 2018-09-12 ENCOUNTER — Encounter (HOSPITAL_BASED_OUTPATIENT_CLINIC_OR_DEPARTMENT_OTHER): Payer: Self-pay | Admitting: Family Medicine

## 2018-09-13 ENCOUNTER — Telehealth: Payer: Self-pay

## 2018-09-13 NOTE — Telephone Encounter (Signed)
Called patient to inform her of lab results. LMTCB

## 2018-09-13 NOTE — Telephone Encounter (Signed)
-----   Message from Donnamae Jude, MD sent at 09/12/2018  5:33 PM EDT ----- Biopsy is benign--please inform pt.  Endometrium, curettage - BENIGN POLYPOID INACTIVE ENDOMETRIUM WITH FOCAL TUBAL METAPLASIA. - NEGATIVE FOR HYPERPLASIA OR MALIGNANCY .

## 2018-09-25 ENCOUNTER — Other Ambulatory Visit: Payer: Self-pay | Admitting: Family Medicine

## 2018-09-27 ENCOUNTER — Other Ambulatory Visit: Payer: Self-pay

## 2018-09-27 ENCOUNTER — Ambulatory Visit (INDEPENDENT_AMBULATORY_CARE_PROVIDER_SITE_OTHER): Payer: Medicare HMO | Admitting: Family Medicine

## 2018-09-27 ENCOUNTER — Encounter: Payer: Self-pay | Admitting: Family Medicine

## 2018-09-27 DIAGNOSIS — N8111 Cystocele, midline: Secondary | ICD-10-CM

## 2018-09-27 DIAGNOSIS — N950 Postmenopausal bleeding: Secondary | ICD-10-CM | POA: Diagnosis not present

## 2018-09-27 MED ORDER — MEDROXYPROGESTERONE ACETATE 5 MG PO TABS: 5.0000 mg | ORAL_TABLET | Freq: Every day | ORAL | 1 refills | Status: DC

## 2018-09-27 NOTE — Assessment & Plan Note (Signed)
Trial of Provera, low dose to see if this stops her bleeding. May stop med once bleeding has stopped.

## 2018-09-27 NOTE — Progress Notes (Signed)
TELEHEALTH VIRTUAL GYNECOLOGY VISIT ENCOUNTER NOTE  I connected with Susan Oneill on 09/27/18 at  4:15 PM EDT by telephone at home and verified that I am speaking with the correct person using two identifiers.   I discussed the limitations, risks, security and privacy concerns of performing an evaluation and management service by telephone and the availability of in person appointments. I also discussed with the patient that there may be a patient responsible charge related to this service. The patient expressed understanding and agreed to proceed.   History:  Susan Oneill is a 68 y.o. [id number] female being evaluated today for post op check. Had a D & C and Hysteroscopy, with negative pathology--reviewed with patient.  Having some drainage, that is a lot like spotting. She denies any abnormal vaginal discharge, bleeding, pelvic pain or other concerns.      Past Medical History:  Diagnosis Date   Acid reflux    Anxiety    xanax as needed   Arthritis    oa and pain both knees   Atypical chest pain 5 / 2014   DISCHARGE SUMMARY [facility] - RULLED OUT FOR MI, NEGATIVE NUCLEAR STRESS TEST - PAIN ATTRIBUTED TO PT'S GERD- PT STATES NO FURTHER CHEST PAINS - [facility] HAS HELPED PAIN   Cyst in hand    ?? CYST BACK OF LEFT HAND - PT STATES THE AREA POPPED UP - RAISED AREA - SOMETIMES PAINFUL - HAS A BRACE TO WEAR ON THAT HAND WHEN SLEEPING   Hemorrhoids    HTN (hypertension)    Hyperlipidemia    Tinnitus    Transfusion history 1978   after childbirth - not all of placenta passed and pt started bleeding   Urticaria    Uterine fibroid    no problems from the fibroids   Varicose veins    Past Surgical History:  Procedure Laterality Date   CARPAL TUNNEL RELEASE     bilateral   DILATATION & CURETTAGE/HYSTEROSCOPY WITH MYOSURE N/A 09/11/2018   Procedure: [place];  Surgeon: Donnamae Jude, MD;  Location: [facility];   Service: Gynecology;  Laterality: N/A;   HEMORROIDECTOMY     HERNIA REPAIR  [id number]   umbilical hernia repair   KNEE SURGERY     bilateral knee arthroscopy- rt knee sept 2011, left knee march 2010   TOTAL KNEE ARTHROPLASTY Right 10/21/2013   Procedure: RIGHT TOTAL KNEE ARTHROPLASTY;  Surgeon: Gearlean Alf, MD;  Location: [facility];  Service: Orthopedics;  Laterality: Right;   The following portions of the patient's history were reviewed and updated as appropriate: allergies, current medications, past family history, past medical history, past social history, past surgical history and problem list.   Health Maintenance:  Normal pap and negative HRHPV on 07/24/18.    Review of Systems:  Pertinent items noted in HPI and remainder of comprehensive ROS otherwise negative.  Physical Exam:  Physical exam deferred due to nature of the encounter   Assessment and Plan:          Problem List Items Addressed This Visit      Unprioritized   Cystocele, midline    Offered expectant management vs. Pessary vs. Referral to Uro/GYN-->she will f/u for this in 3 months.      Postmenopausal bleeding - Primary    Trial of Provera, low dose to see if this stops her bleeding. May stop med once bleeding has stopped.      Relevant Medications  medroxyPROGESTERone (PROVERA) 5 MG tablet        I discussed the assessment and treatment plan with the patient. The patient was provided an opportunity to ask questions and all were answered. The patient agreed with the plan and demonstrated an understanding of the instructions.   The patient was advised to call back or seek an in-person evaluation/go to the ED if the symptoms worsen or if the condition fails to improve as anticipated.  I provided 5 minutes of non-face-to-face time during this encounter.   Donnamae Jude, MD [facility], [place]

## 2018-09-27 NOTE — Assessment & Plan Note (Signed)
Offered expectant management vs. Pessary vs. Referral to Uro/GYN-->she will f/u for this in 3 months.

## 2018-11-20 ENCOUNTER — Other Ambulatory Visit: Payer: Self-pay | Admitting: Family Medicine

## 2018-11-21 ENCOUNTER — Other Ambulatory Visit: Payer: Self-pay | Admitting: Family Medicine

## 2018-11-21 NOTE — Telephone Encounter (Signed)
Needs to schedule virtual visit-have our staff call patient to schedule May have 1 refill each

## 2018-11-22 NOTE — Telephone Encounter (Signed)
Please call and schedule pt and route message back to clinical pool so we can send in med after appt is made

## 2018-11-23 NOTE — Telephone Encounter (Signed)
Pt has in office visit scheduled for Tuesday 11/27/2018

## 2018-11-27 ENCOUNTER — Ambulatory Visit (INDEPENDENT_AMBULATORY_CARE_PROVIDER_SITE_OTHER): Payer: Medicare HMO | Admitting: Family Medicine

## 2018-11-27 ENCOUNTER — Other Ambulatory Visit: Payer: Self-pay

## 2018-11-27 ENCOUNTER — Other Ambulatory Visit: Payer: Self-pay | Admitting: Family Medicine

## 2018-11-27 DIAGNOSIS — E785 Hyperlipidemia, unspecified: Secondary | ICD-10-CM

## 2018-11-27 DIAGNOSIS — Z79899 Other long term (current) drug therapy: Secondary | ICD-10-CM

## 2018-11-27 DIAGNOSIS — K219 Gastro-esophageal reflux disease without esophagitis: Secondary | ICD-10-CM | POA: Diagnosis not present

## 2018-11-27 DIAGNOSIS — N390 Urinary tract infection, site not specified: Secondary | ICD-10-CM | POA: Diagnosis not present

## 2018-11-27 DIAGNOSIS — M170 Bilateral primary osteoarthritis of knee: Secondary | ICD-10-CM | POA: Diagnosis not present

## 2018-11-27 DIAGNOSIS — Z23 Encounter for immunization: Secondary | ICD-10-CM | POA: Diagnosis not present

## 2018-11-27 DIAGNOSIS — I10 Essential (primary) hypertension: Secondary | ICD-10-CM

## 2018-11-27 LAB — POCT URINALYSIS DIPSTICK
Spec Grav, UA: >=1.03 — AB (ref 1.010–1.025)
pH, UA: 5 (ref 5.0–8.0)

## 2018-11-27 MED ORDER — HYDROCHLOROTHIAZIDE 25 MG PO TABS: 25.0000 mg | ORAL_TABLET | Freq: Every day | ORAL | 1 refills | Status: DC

## 2018-11-27 MED ORDER — LISINOPRIL 40 MG PO TABS: 40.0000 mg | ORAL_TABLET | Freq: Every day | ORAL | 1 refills | Status: DC

## 2018-11-27 MED ORDER — DOXYCYCLINE HYCLATE 100 MG PO TABS: 100.0000 mg | ORAL_TABLET | Freq: Two times a day (BID) | ORAL | 0 refills | Status: DC

## 2018-11-27 MED ORDER — ZOSTER VAC RECOMB ADJUVANTED 50 MCG/0.5ML IM SUSR: 0.5000 mL | Freq: Once | INTRAMUSCULAR | 1 refills | Status: AC

## 2018-11-27 NOTE — Patient Instructions (Addendum)
Shingrix and shingles prevention: know the facts!   Shingrix is a very effective vaccine to prevent shingles.   Shingles is a reactivation of chickenpox -more than 99% of Americans born before 1980 have had chickenpox even if they do not remember it. One in every 10 people who get shingles have severe long-lasting nerve pain as a result.   33 out of a 100 older adults will get shingles if they are unvaccinated.     This vaccine is very important for your health This vaccine is indicated for anyone 50 years or older. You can get this vaccine even if you have already had shingles because you can get the disease more than once in a lifetime.  Your risk for shingles and its complications increases with age.  This vaccine has 2 doses.  The second dose would be 2 to 6 months after the first dose.  If you had Zostavax vaccine in the past you should still get Shingrix. ( Zostavax is only 70% effective and it loses significant strength over a few years .)  This vaccine is given through the pharmacy.  The cost of the vaccine is through your insurance. The pharmacy can inform you of the total costs.  Common side effects including soreness in the arm, some redness and swelling, also some feel fatigue muscle soreness headache low-grade fever.  Side effects typically go away within 2 to 3 days. Remember-the pain from shingles can last a lifetime but these side effects of the vaccine will only last a few days at most. It is very important to get both doses in order to protect yourself fully.   Please get this vaccine at your earliest convenience at your trusted pharmacy.   DASH Eating Plan DASH stands for "Dietary Approaches to Stop Hypertension." The DASH eating plan is a healthy eating plan that has been shown to reduce high blood pressure (hypertension). It may also reduce your risk for type 2 diabetes, heart disease, and stroke. The DASH eating plan may also help with weight loss. What are tips  for following this plan?  General guidelines  Avoid eating more than 2,300 mg (milligrams) of salt (sodium) a day. If you have hypertension, you may need to reduce your sodium intake to 1,500 mg a day.  Limit alcohol intake to no more than 1 drink a day for nonpregnant women and 2 drinks a day for men. One drink equals 12 oz of beer, 5 oz of wine, or 1 oz of hard liquor.  Work with your health care provider to maintain a healthy body weight or to lose weight. Ask what an ideal weight is for you.  Get at least 30 minutes of exercise that causes your heart to beat faster (aerobic exercise) most days of the week. Activities may include walking, swimming, or biking.  Work with your health care provider or diet and nutrition specialist (dietitian) to adjust your eating plan to your individual calorie needs. Reading food labels   Check food labels for the amount of sodium per serving. Choose foods with less than 5 percent of the Daily Value of sodium. Generally, foods with less than 300 mg of sodium per serving fit into this eating plan.  To find whole grains, look for the word "whole" as the first word in the ingredient list. Shopping  Buy products labeled as "low-sodium" or "no salt added."  Buy fresh foods. Avoid canned foods and premade or frozen meals. Cooking  Avoid adding salt when cooking. Use  salt-free seasonings or herbs instead of table salt or sea salt. Check with your health care provider or pharmacist before using salt substitutes.  Do not fry foods. Cook foods using healthy methods such as baking, boiling, grilling, and broiling instead.  Cook with heart-healthy oils, such as olive, canola, soybean, or sunflower oil. Meal planning  Eat a balanced diet that includes: ? 5 or more servings of fruits and vegetables each day. At each meal, try to fill half of your plate with fruits and vegetables. ? Up to 6-8 servings of whole grains each day. ? Less than 6 oz of lean meat,  poultry, or fish each day. A 3-oz serving of meat is about the same size as a deck of cards. One egg equals 1 oz. ? 2 servings of low-fat dairy each day. ? A serving of nuts, seeds, or beans 5 times each week. ? Heart-healthy fats. Healthy fats called Omega-3 fatty acids are found in foods such as flaxseeds and coldwater fish, like sardines, salmon, and mackerel.  Limit how much you eat of the following: ? Canned or prepackaged foods. ? Food that is high in trans fat, such as fried foods. ? Food that is high in saturated fat, such as fatty meat. ? Sweets, desserts, sugary drinks, and other foods with added sugar. ? Full-fat dairy products.  Do not salt foods before eating.  Try to eat at least 2 vegetarian meals each week.  Eat more home-cooked food and less restaurant, buffet, and fast food.  When eating at a restaurant, ask that your food be prepared with less salt or no salt, if possible. What foods are recommended? The items listed may not be a complete list. Talk with your dietitian about what dietary choices are best for you. Grains Whole-grain or whole-wheat bread. Whole-grain or whole-wheat pasta. Brown rice. [place]. Bulgur. Whole-grain and low-sodium cereals. Pita bread. Low-fat, low-sodium crackers. Whole-wheat flour tortillas. Vegetables Fresh or frozen vegetables (raw, steamed, roasted, or grilled). Low-sodium or reduced-sodium tomato and vegetable juice. Low-sodium or reduced-sodium tomato sauce and tomato paste. Low-sodium or reduced-sodium canned vegetables. Fruits All fresh, dried, or frozen fruit. Canned fruit in natural juice (without added sugar). Meat and other protein foods Skinless chicken or [place]. Ground chicken or [place]. Pork with fat trimmed off. Fish and seafood. Egg whites. Dried beans, peas, or lentils. Unsalted nuts, nut butters, and seeds. Unsalted canned beans. Lean cuts of beef with fat trimmed off. Low-sodium, lean deli meat. Dairy Low-fat  (1%) or fat-free (skim) milk. Fat-free, low-fat, or reduced-fat cheeses. Nonfat, low-sodium ricotta or cottage cheese. Low-fat or nonfat yogurt. Low-fat, low-sodium cheese. Fats and oils Soft margarine without trans fats. Vegetable oil. Low-fat, reduced-fat, or light mayonnaise and salad dressings (reduced-sodium). Canola, safflower, olive, soybean, and sunflower oils. Avocado. Seasoning and other foods Herbs. Spices. Seasoning mixes without salt. Unsalted popcorn and pretzels. Fat-free sweets. What foods are not recommended? The items listed may not be a complete list. Talk with your dietitian about what dietary choices are best for you. Grains Baked goods made with fat, such as croissants, muffins, or some breads. Dry pasta or rice meal packs. Vegetables Creamed or fried vegetables. Vegetables in a cheese sauce. Regular canned vegetables (not low-sodium or reduced-sodium). Regular canned tomato sauce and paste (not low-sodium or reduced-sodium). Regular tomato and vegetable juice (not low-sodium or reduced-sodium). Angie Fava. Olives. Fruits Canned fruit in a light or heavy syrup. Fried fruit. Fruit in cream or butter sauce. Meat and other protein foods Fatty cuts  of meat. Ribs. Fried meat. Berniece Salines. Sausage. Bologna and other processed lunch meats. Salami. Fatback. Hotdogs. Bratwurst. Salted nuts and seeds. Canned beans with added salt. Canned or smoked fish. Whole eggs or egg yolks. Chicken or [place] with skin. Dairy Whole or 2% milk, cream, and half-and-half. Whole or full-fat cream cheese. Whole-fat or sweetened yogurt. Full-fat cheese. Nondairy creamers. Whipped toppings. Processed cheese and cheese spreads. Fats and oils Butter. Stick margarine. Lard. Shortening. Ghee. Bacon fat. Tropical oils, such as coconut, palm kernel, or palm oil. Seasoning and other foods Salted popcorn and pretzels. Onion salt, garlic salt, seasoned salt, table salt, and sea salt. Worcestershire sauce. Tartar sauce.  Barbecue sauce. Teriyaki sauce. Soy sauce, including reduced-sodium. Steak sauce. Canned and packaged gravies. Fish sauce. Oyster sauce. Cocktail sauce. Horseradish that you find on the shelf. Ketchup. Mustard. Meat flavorings and tenderizers. Bouillon cubes. Hot sauce and Tabasco sauce. Premade or packaged marinades. Premade or packaged taco seasonings. Relishes. Regular salad dressings. Where to find more information:  National Heart, Lung, and [place]: https://wilson-eaton.com/  American Heart Association: www.heart.org Summary  The DASH eating plan is a healthy eating plan that has been shown to reduce high blood pressure (hypertension). It may also reduce your risk for type 2 diabetes, heart disease, and stroke.  With the DASH eating plan, you should limit salt (sodium) intake to 2,300 mg a day. If you have hypertension, you may need to reduce your sodium intake to 1,500 mg a day.  When on the DASH eating plan, aim to eat more fresh fruits and vegetables, whole grains, lean proteins, low-fat dairy, and heart-healthy fats.  Work with your health care provider or diet and nutrition specialist (dietitian) to adjust your eating plan to your individual calorie needs. This information is not intended to replace advice given to you by your health care provider. Make sure you discuss any questions you have with your health care provider. Document Released: 06/09/2011 Document Revised: 06/13/2016 Document Reviewed: 06/13/2016 Elsevier Interactive Patient Education  2019 [employer].

## 2018-11-27 NOTE — Progress Notes (Signed)
Subjective:    Patient ID: Susan Oneill, female    DOB: Nov 21, 1950, 68 y.o.   MRN: [id number]  Hypertension  This is a chronic problem. Pertinent negatives include no chest pain, headaches or shortness of breath. There are no compliance problems.   Patient with UTI symptoms dysuria urinary frequency denies high fever chills sweats back pain or lower abdominal pain She does have dropped bladder she is looking into possibly having surgery with urology She does use Xanax as needed but not frequently Patient does have blood pressure issues Patient for blood pressure check up.  The patient does have hypertension.  The patient is on medication.  Patient relates compliance with meds. Todays BP reviewed with the patient. Patient denies issues with medication. Patient relates reasonable diet. Patient tries to minimize salt. Patient aware of BP goals.  The patient's BMI is calculated.  The patient does have obesity.  The patient does try to some degree staying active and watching diet.  It is in the vital signs and acknowledged.  It is above the recommended BMI for the patient's height and weight.  The patient has been counseled regarding healthy diet, restricted portions, avoiding excessive carbohydrates/sugary foods, and increase physical activity as health permits.  It is in the patient's best interest to lower the risk of secondary illness including heart disease strokes and cancer by losing weight.  The patient acknowledges this information.  Patient does have ongoing trouble with reflux.  Takes medication on a regular basis.  Tries to minimize foods as best they can.  They understand the importance of dietary compliance.  May also try to avoid eating a large meal close to bedtime.  Patient denies any dysphagia denies hematochezia.  States medicine does a good job keeping the problem under good control.  Without the medication may certainly have issues.They desire to continue taking their  medication.  Patient does have severe osteoarthritis of the knees and uses pain medication to help she also takes anti-inflammatories on a regular basis Symptoms of uti.  Results for orders placed or performed in visit on 11/27/18  POCT urinalysis dipstick  Result Value Ref Range   Color, UA     Clarity, UA     Glucose, UA     Bilirubin, UA     Ketones, UA     Spec Grav, UA >=1.030 (A) 1.010 - 1.025   Blood, UA     pH, UA 5.0 5.0 - 8.0   Protein, UA     Urobilinogen, UA     Nitrite, UA     Leukocytes, UA Small (1+) (A) Negative   Appearance     Odor       Review of Systems  Constitutional: Negative for activity change, fatigue and fever.  HENT: Negative for congestion and rhinorrhea.   Respiratory: Negative for cough, chest tightness and shortness of breath.   Cardiovascular: Negative for chest pain and leg swelling.  Gastrointestinal: Negative for abdominal pain and nausea.  Skin: Negative for color change.  Neurological: Negative for dizziness and headaches.  Psychiatric/Behavioral: Negative for agitation and behavioral problems.       Objective:   Physical Exam Vitals signs reviewed.  Constitutional:      General: She is not in acute distress. HENT:     Head: Normocephalic and atraumatic.  Eyes:     General:        Right eye: No discharge.        Left eye: No discharge.  Neck:     Trachea: No tracheal deviation.  Cardiovascular:     Rate and Rhythm: Normal rate and regular rhythm.     Heart sounds: Normal heart sounds. No murmur.  Pulmonary:     Effort: Pulmonary effort is normal. No respiratory distress.     Breath sounds: Normal breath sounds.  Lymphadenopathy:     Cervical: No cervical adenopathy.  Skin:    General: Skin is warm and dry.  Neurological:     Mental Status: She is alert.     Coordination: Coordination normal.  Psychiatric:        Behavior: Behavior normal.           Assessment & Plan:  UTI antibiotics check culture  none  HTN- Patient was seen today as part of a visit regarding hypertension. The importance of healthy diet and regular physical activity was discussed. The importance of compliance with medications discussed.  Ideal goal is to keep blood pressure low elevated levels certainly below 803/21 when possible.  The patient was counseled that keeping blood pressure under control lessen his risk of complications.  The importance of regular follow-ups was discussed with the patient.  Low-salt diet such as DASH recommended.  Regular physical activity was recommended as well.  Patient was advised to keep regular follow-ups.  The patient was seen today as part of an evaluation regarding hyperlipidemia.  Recent lab work has been reviewed with the patient as well as the goals for good cholesterol care.  In addition to this medications have been discussed the importance of compliance with diet and medications discussed as well.  Finally the patient is aware that poor control of cholesterol, noncompliance can dramatically increase the risk of complications. The patient will keep regular office visits and the patient does agreed to periodic lab wo  This osteo-Moderate obesity encouragement for the patient to exercise lose weight watch diet  Osteoarthritis important to use tramadol on a as needed basis and anti-inflammatory regular basis    25 minutes was spent with the patient.  This statement verifies that 25 minutes was indeed spent with the patient.  More than 50% of this visit-total duration of the visit-was spent in counseling and coordination of care. The issues that the patient came in for today as reflected in the diagnosis (s) please refer to documentation for further details.

## 2018-11-30 LAB — URINE CULTURE

## 2018-11-30 LAB — SPECIMEN STATUS REPORT

## 2018-12-08 ENCOUNTER — Other Ambulatory Visit: Payer: Self-pay | Admitting: Family Medicine

## 2018-12-10 NOTE — Telephone Encounter (Signed)
Ok plus two monthly ref 

## 2018-12-11 ENCOUNTER — Other Ambulatory Visit: Payer: Self-pay | Admitting: Family Medicine

## 2018-12-11 NOTE — Telephone Encounter (Signed)
Ok plus five monthly ref 

## 2018-12-19 ENCOUNTER — Other Ambulatory Visit: Payer: Self-pay | Admitting: Internal Medicine

## 2018-12-19 ENCOUNTER — Other Ambulatory Visit: Payer: Medicare HMO

## 2018-12-19 DIAGNOSIS — Z20822 Contact with and (suspected) exposure to covid-19: Secondary | ICD-10-CM

## 2018-12-19 DIAGNOSIS — R6889 Other general symptoms and signs: Secondary | ICD-10-CM | POA: Diagnosis not present

## 2018-12-22 LAB — NOVEL CORONAVIRUS, NAA: SARS-CoV-2, NAA: NOT DETECTED

## 2019-03-12 ENCOUNTER — Other Ambulatory Visit: Payer: Self-pay | Admitting: Family Medicine

## 2019-03-13 NOTE — Telephone Encounter (Signed)
Patient needs follow-up visit this fall please arrange this for September it may be virtual if she would like then I will approve the prescription

## 2019-03-14 NOTE — Telephone Encounter (Signed)
Pt is scheduled for OV & flu shot on 03/28/2019 - please refill med

## 2019-03-19 ENCOUNTER — Telehealth: Payer: Self-pay | Admitting: Family Medicine

## 2019-03-19 ENCOUNTER — Other Ambulatory Visit: Payer: Self-pay | Admitting: Family Medicine

## 2019-03-19 ENCOUNTER — Other Ambulatory Visit: Payer: Self-pay | Admitting: *Deleted

## 2019-03-19 MED ORDER — TRAMADOL HCL 50 MG PO TABS: ORAL_TABLET | ORAL | 0 refills | Status: DC

## 2019-03-19 NOTE — Telephone Encounter (Signed)
error 

## 2019-03-28 ENCOUNTER — Ambulatory Visit (INDEPENDENT_AMBULATORY_CARE_PROVIDER_SITE_OTHER): Payer: Medicare HMO | Admitting: Family Medicine

## 2019-03-28 ENCOUNTER — Encounter: Payer: Self-pay | Admitting: Family Medicine

## 2019-03-28 ENCOUNTER — Other Ambulatory Visit: Payer: Self-pay

## 2019-03-28 VITALS — BP 134/78 | Temp 97.3°F | Ht 65.0 in | Wt 217.6 lb

## 2019-03-28 DIAGNOSIS — M170 Bilateral primary osteoarthritis of knee: Secondary | ICD-10-CM

## 2019-03-28 DIAGNOSIS — I10 Essential (primary) hypertension: Secondary | ICD-10-CM | POA: Diagnosis not present

## 2019-03-28 DIAGNOSIS — K219 Gastro-esophageal reflux disease without esophagitis: Secondary | ICD-10-CM

## 2019-03-28 DIAGNOSIS — E785 Hyperlipidemia, unspecified: Secondary | ICD-10-CM | POA: Diagnosis not present

## 2019-03-28 DIAGNOSIS — Z1211 Encounter for screening for malignant neoplasm of colon: Secondary | ICD-10-CM

## 2019-03-28 DIAGNOSIS — Z79899 Other long term (current) drug therapy: Secondary | ICD-10-CM | POA: Diagnosis not present

## 2019-03-28 MED ORDER — TRAMADOL HCL 50 MG PO TABS: ORAL_TABLET | ORAL | 5 refills | Status: DC

## 2019-03-28 MED ORDER — ALPRAZOLAM 0.5 MG PO TABS: ORAL_TABLET | ORAL | 5 refills | Status: DC

## 2019-03-28 NOTE — Progress Notes (Signed)
Subjective:    Patient ID: Susan Oneill, female    DOB: 04/03/1951, 68 y.o.   MRN: [id number]  Hypertension This is a chronic problem. The current episode started more than 1 year ago. Pertinent negatives include no chest pain or shortness of breath. Risk factors for coronary artery disease include post-menopausal state. Treatments tried: lisinopril, hctz.   Patient has significant anxiety related issues uses alprazolam on a twice daily basis when necessary.  She states it does help her function denies it causing drowsiness  Patient also has severe osteoarthritis worse in the left knee than the right knee tramadol does help her with pain Naprosyn helps as well she uses them on a regular basis denies misusing the medicine.  Drug registry checked. Patient states her blood pressure been doing well taking her medicine regular basis Watches salt in her diet unable to do a lot of exercise but stays busy with work  Patient does have ongoing trouble with reflux.  Takes medication on a regular basis.  Tries to minimize foods as best they can.  They understand the importance of dietary compliance.  May also try to avoid eating a large meal close to bedtime.  Patient denies any dysphagia denies hematochezia.  States medicine does a good job keeping the problem under good control.  Without the medication may certainly have issues.They desire to continue taking their medication. Patient does well with taking her reflux related medicine does good for her she would like to continue this  Patient is due for colonoscopy but does not want to do it currently during [place] therefore we will do stool test for blood  She will do a senior dose flu shot at the pharmacy   Review of Systems  Constitutional: Negative for activity change, appetite change and fatigue.  HENT: Negative for congestion and rhinorrhea.   Respiratory: Negative for cough and shortness of breath.   Cardiovascular: Negative for chest  pain and leg swelling.  Gastrointestinal: Negative for abdominal pain and diarrhea.  Endocrine: Negative for polydipsia and polyphagia.  Skin: Negative for color change.  Neurological: Negative for dizziness and weakness.  Psychiatric/Behavioral: Negative for behavioral problems and confusion.            Objective:   Physical Exam Vitals signs reviewed.  Constitutional:      General: She is not in acute distress. HENT:     Head: Normocephalic and atraumatic.  Eyes:     General:        Right eye: No discharge.        Left eye: No discharge.  Neck:     Trachea: No tracheal deviation.  Cardiovascular:     Rate and Rhythm: Normal rate and regular rhythm.     Heart sounds: Normal heart sounds. No murmur.  Pulmonary:     Effort: Pulmonary effort is normal. No respiratory distress.     Breath sounds: Normal breath sounds.  Lymphadenopathy:     Cervical: No cervical adenopathy.  Skin:    General: Skin is warm and dry.  Neurological:     Mental Status: She is alert.     Coordination: Coordination normal.  Psychiatric:        Behavior: Behavior normal.           Assessment & Plan:  1. Essential hypertension Blood pressure good control she watches her diet she takes her medicine watches salt continue all of this  2. Gastroesophageal reflux disease without esophagitis Reflux under good control with  medication continue this currently  3. Screening for malignant neoplasm of colon Stool test for blood - IFOBT POC (occult bld, rslt in office); Future  4. Osteoarthritis of both knees, unspecified osteoarthritis type She needs refills on her tramadol we will use that also Naprosyn if she starts having any GI upset related to that she will let us know eventually she will need to have surgery  She also is considering stopping work by the later part of this year I think it would be a good idea for her to get out of the workplace by December  5. Hyperlipidemia, unspecified  hyperlipidemia type Hyperlipidemia previous lipids have been mildly elevated very important for the patient to check her blood work she has not done it yet  Mild anxiety related issues uses Xanax on a as needed basis does not abuse it   25 minutes was spent with the patient.  This statement verifies that 25 minutes was indeed spent with the patient.  More than 50% of this visit-total duration of the visit-was spent in counseling and coordination of care. The issues that the patient came in for today as reflected in the diagnosis (s) please refer to documentation for further details.

## 2019-03-29 LAB — LIPID PANEL
Chol/HDL Ratio: 3.3 ratio (ref 0.0–4.4)
Cholesterol, Total: 204 mg/dL — ABNORMAL HIGH (ref 100–199)
HDL: 62 mg/dL (ref >39)
LDL Chol Calc (NIH): 121 mg/dL — ABNORMAL HIGH (ref 0–99)
Triglycerides: 120 mg/dL (ref 0–149)
VLDL Cholesterol Cal: 21 mg/dL (ref 5–40)

## 2019-03-29 LAB — BASIC METABOLIC PANEL
BUN/Creatinine Ratio: 21 (ref 12–28)
BUN: 17 mg/dL (ref 8–27)
CO2: 25 mmol/L (ref 20–29)
Calcium: 9.4 mg/dL (ref 8.7–10.3)
Chloride: 102 mmol/L (ref 96–106)
Creatinine, Ser: 0.82 mg/dL (ref 0.57–1.00)
GFR calc Af Amer: 87 mL/min/{1.73_m2} (ref >59)
GFR calc non Af Amer: 75 mL/min/{1.73_m2} (ref >59)
Glucose: 88 mg/dL (ref 65–99)
Potassium: 4.1 mmol/L (ref 3.5–5.2)
Sodium: 138 mmol/L (ref 134–144)

## 2019-03-29 LAB — HEPATIC FUNCTION PANEL
ALT: 11 IU/L (ref 0–32)
AST: 18 IU/L (ref 0–40)
Albumin: 4.4 g/dL (ref 3.8–4.8)
Alkaline Phosphatase: 72 IU/L (ref 39–117)
Bilirubin Total: 0.3 mg/dL (ref 0.0–1.2)
Bilirubin, Direct: 0.1 mg/dL (ref 0.00–0.40)
Total Protein: 6.9 g/dL (ref 6.0–8.5)

## 2019-04-05 ENCOUNTER — Other Ambulatory Visit: Payer: Self-pay | Admitting: Family Medicine

## 2019-04-08 ENCOUNTER — Other Ambulatory Visit: Payer: Self-pay | Admitting: Family Medicine

## 2019-04-08 NOTE — Telephone Encounter (Signed)
6 months for each

## 2019-05-13 ENCOUNTER — Other Ambulatory Visit: Payer: Self-pay

## 2019-05-13 ENCOUNTER — Ambulatory Visit: Payer: Medicare HMO

## 2019-05-13 ENCOUNTER — Encounter: Payer: Self-pay | Admitting: Orthopedic Surgery

## 2019-05-13 ENCOUNTER — Ambulatory Visit: Payer: Medicare HMO | Admitting: Orthopedic Surgery

## 2019-05-13 VITALS — BP 121/81 | HR 83 | Temp 98.3°F | Ht 65.0 in | Wt 216.0 lb

## 2019-05-13 DIAGNOSIS — G8929 Other chronic pain: Secondary | ICD-10-CM

## 2019-05-13 DIAGNOSIS — M25511 Pain in right shoulder: Secondary | ICD-10-CM

## 2019-05-13 DIAGNOSIS — M7551 Bursitis of right shoulder: Secondary | ICD-10-CM

## 2019-05-13 NOTE — Patient Instructions (Signed)
You have received an injection of steroids into the joint. 15% of patients will have increased pain within the 24 hours postinjection.   This is transient and will go away.   We recommend that you use ice packs on the injection site for 20 minutes every 2 hours and extra strength Tylenol 2 tablets every 8 as needed until the pain resolves.  If you continue to have pain after taking the Tylenol and using the ice please call the office for further instructions.  Continue heat   Continue aspercreme  Continue naproxen

## 2019-05-13 NOTE — Progress Notes (Signed)
Susan Oneill  05/13/2019  HISTORY SECTION :  Chief Complaint  Patient presents with  . Shoulder Pain    Right shoulder pain, no known injury.   68 year old female presents with 4-week history of shoulder pain on the right side no trauma.  She does lift heavy rolls of the arm above her head.  She rates her pain 9 out of 10 says it is most present when she is lying on her right shoulder she has some pain with lifting her arm but no loss of motion  Location right shoulder and arm Quality dull ache Severity 9 out of 10 Duration 4 months  She did try some topical liniment ice tramadol and Naprosyn   Review of Systems  Musculoskeletal:       Bilateral groin pain  All other systems reviewed and are negative.    has a past medical history of Acid reflux, Anxiety, Arthritis, Atypical chest pain (5 / 2014), Cyst in hand, Hemorrhoids, HTN (hypertension), Hyperlipidemia, Tinnitus, Transfusion history (1978), Urticaria, Uterine fibroid, and Varicose veins.   Past Surgical History:  Procedure Laterality Date  . CARPAL TUNNEL RELEASE     bilateral  . DILATATION & CURETTAGE/HYSTEROSCOPY WITH MYOSURE N/A 09/11/2018   Procedure: DILATATION & CURETTAGE/HYSTEROSCOPY WITH MYOSURE;  Surgeon: Donnamae Jude, MD;  Location: [facility];  Service: Gynecology;  Laterality: N/A;  . HEMORROIDECTOMY    . HERNIA REPAIR  [id number]   umbilical hernia repair  . KNEE SURGERY     bilateral knee arthroscopy- rt knee sept 2011, left knee march 2010  . TOTAL KNEE ARTHROPLASTY Right 10/21/2013   Procedure: RIGHT TOTAL KNEE ARTHROPLASTY;  Surgeon: Gearlean Alf, MD;  Location: [facility];  Service: Orthopedics;  Laterality: Right;    Body mass index is 35.94 kg/m.   Allergies  Allergen Reactions  . Amoxicillin Nausea And Vomiting and Rash  . Augmentin [Amoxicillin-Pot Clavulanate] Nausea And Vomiting       . Ciprofloxacin Nausea And Vomiting  . Codeine Nausea And Vomiting and Rash  . Latex  Rash    Pt states some tape ok  . Mobic [Meloxicam] Other (See Comments)    Constipation  . Sulfonamide Derivatives Nausea And Vomiting and Rash     Current Outpatient Medications:  .  ALPRAZolam (XANAX) 0.5 MG tablet, TAKE 1/2- TO 1 TABLET TWO TIMES A DAY AS NEEDED BY MOUTH., Disp: 45 tablet, Rfl: 5 .  esomeprazole (NEXIUM) 20 MG capsule, Take 20 mg by mouth daily at 12 noon., Disp: , Rfl:  .  hydrochlorothiazide (HYDRODIURIL) 25 MG tablet, TAKE 1 TABLET (25 MG TOTAL) BY MOUTH DAILY., Disp: 90 tablet, Rfl: 1 .  lisinopril (ZESTRIL) 40 MG tablet, TAKE 1 TABLET EVERY DAY (CHANGE IN DOSE. STOP 20MG ), Disp: 90 tablet, Rfl: 1 .  medroxyPROGESTERone (PROVERA) 5 MG tablet, Take 1 tablet (5 mg total) by mouth daily., Disp: 90 tablet, Rfl: 1 .  mometasone (ELOCON) 0.1 % cream, APPLY TO AFFECTED AREA DAILY, Disp: 45 g, Rfl: 1 .  naproxen (NAPROSYN) 500 MG tablet, TAKE 1 TABLET TWICE DAILY WITH A MEAL AS NEEDED FOR PAIN, Disp: 180 tablet, Rfl: 1 .  traMADol (ULTRAM) 50 MG tablet, TAKE ONE TABLET BY MOUTH THREE TIMES A DAY AS NEEDED, Disp: 75 tablet, Rfl: 5   PHYSICAL EXAM SECTION: 1) BP 121/81   Pulse 83   Temp 98.3 F (36.8 C)   Ht 5\' 5"  (1.651 m)   Wt 216 lb (98 kg)   BMI  35.94 kg/m   Body mass index is 35.94 kg/m. General appearance: Well-developed well-nourished no gross deformities  2) Cardiovascular normal pulse and perfusion in the upper  extremities normal color without edema  3) Neurologically deep tendon reflexes are equal and normal, no sensation loss or deficits no pathologic reflexes  4) Psychological: Awake alert and oriented x3 mood and affect normal  5) Skin no lacerations or ulcerations no nodularity no palpable masses, no erythema or nodularity  6) Musculoskeletal:   Left shoulder normal inspection no tenderness range of motion normal abduction and test stable strength normal  Right shoulder skin is normal inspection there is tenderness in the rotator  interval Range of motion is normal Abduction test normal Rotator cuff strength normal    MEDICAL DECISION SECTION:  Encounter Diagnosis  Name Primary?  . Chronic right shoulder pain Yes    Imaging Normal shoulder see report  Plan:  (Rx., Inj., surg., Frx, MRI/CT, XR:2)   Procedure note the subacromial injection shoulder RIGHT    Verbal consent was obtained to inject the  RIGHT   Shoulder  Timeout was completed to confirm the injection site is a subacromial space of the  RIGHT  shoulder   Medication used Depo-Medrol 40 mg and lidocaine 1% 3 cc  Anesthesia was provided by ethyl chloride  The injection was performed in the RIGHT  posterior subacromial space. After pinning the skin with alcohol and anesthetized the skin with ethyl chloride the subacromial space was injected using a 20-gauge needle. There were no complications  Sterile dressing was applied.  Recommend continue Naprosyn, tramadol, liniment, heat  Patient follow-up for bilateral groin pain we will check the shoulder at that time  10:49 AM Arther Abbott, MD  05/13/2019

## 2019-06-05 ENCOUNTER — Ambulatory Visit: Payer: Medicare HMO | Admitting: Orthopedic Surgery

## 2019-06-05 ENCOUNTER — Other Ambulatory Visit: Payer: Self-pay

## 2019-06-05 VITALS — BP 118/79 | HR 76 | Temp 97.1°F | Ht 65.0 in | Wt 209.8 lb

## 2019-06-05 DIAGNOSIS — M25511 Pain in right shoulder: Secondary | ICD-10-CM | POA: Diagnosis not present

## 2019-06-05 NOTE — Progress Notes (Signed)
Chief Complaint  Patient presents with  . Follow-up    Right shoulder pain, last injectin helped some but hurts worse at night   Susan Oneill had seen [place] back in November on the ninth for 9 out of 10 right shoulder pain no trauma pain was unrelieved by liniment ice tramadol naproxen we injected her shoulder she comes back in still having pain at night pain laying on the right side pain seems to be on the top of the shoulder over the [facility] joint  System review shows no numbness or tingling in the right arm no weakness her stiffness has improved with the home exercise program  Exam shows tenderness over the [facility] joint pain when reaching behind her she has some mild tenderness in the rotator interval positive test for biceps tendinitis as well strength and stability are normal neurovascular exam is intact skin looks good  Inject AC joint  Encounter Diagnosis  Name Primary?  Marland Kitchen Arthralgia of right acromioclavicular joint Yes    Procedure:  Inject AC JOINT  Right shoulder   Acromioclavicular joint injection Verbal consent was obtained Site marking was confirmed and timeout TAKEN TO CONFIRM AC JOINT INJECTION ON THE right shoulder Medications used: Depo-Medrol 40 mg and lidocaine 1% 3 mL  Injection technique: The skin was prepared with alcohol and ethyl chloride and  the right acromioclavicular joint was injected with a [id number] needle  No complications were noted

## 2019-06-05 NOTE — Patient Instructions (Signed)

## 2019-06-26 ENCOUNTER — Telehealth: Payer: Self-pay | Admitting: *Deleted

## 2019-07-01 ENCOUNTER — Encounter: Payer: Self-pay | Admitting: Radiology

## 2019-07-03 ENCOUNTER — Ambulatory Visit (INDEPENDENT_AMBULATORY_CARE_PROVIDER_SITE_OTHER): Payer: Medicare HMO | Admitting: Orthopedic Surgery

## 2019-07-03 ENCOUNTER — Other Ambulatory Visit: Payer: Self-pay

## 2019-07-03 VITALS — BP 141/81 | HR 86 | Temp 97.2°F | Ht 65.0 in | Wt 204.0 lb

## 2019-07-03 DIAGNOSIS — M7521 Bicipital tendinitis, right shoulder: Secondary | ICD-10-CM | POA: Diagnosis not present

## 2019-07-03 DIAGNOSIS — M25511 Pain in right shoulder: Secondary | ICD-10-CM

## 2019-07-03 MED ORDER — MELOXICAM 7.5 MG PO TABS: 7.5000 mg | ORAL_TABLET | Freq: Every day | ORAL | 5 refills | Status: DC

## 2019-07-03 NOTE — Progress Notes (Signed)
Chief Complaint  Patient presents with  . Follow-up    Recheck on right shoulder.   Yar came to see [place] December 2 we thought she had AC joint arthritis although she did have some biceps symptoms at the same time  We gave her an injection in the [facility] joint that seems to have helped but she actually complains of pain in the front of the shoulder again with tenderness in the bicipital groove and some arm pain posteriorly in the triceps on the left side  Review of systems no changes noted from our review of systems on 12 2.  There was no numbness or tingling or arm weakness.  Exam today shows tenderness in the bicipital groove pain with extension pain with provocative biceps maneuvers such as the Speed test mild impingement with the Hawkins and Neer maneuver cuff strength is intact without weakness  Inject biceps tendon right shoulder  Procedure: Biceps tendon injection Right biceps tendon was injected The patient gave verbal consent for cortisone injection Timeout confirmed the site of injection Medications used included 40 mg of Depo-Medrol and 3 mL 1% lidocaine After alcohol and ethyl chloride preparation the point of maximal tenderness was injected over the right biceps tendon there were no complications  Meds ordered this encounter  Medications  . meloxicam (MOBIC) 7.5 MG tablet    Sig: Take 1 tablet (7.5 mg total) by mouth daily.    Dispense:  30 tablet    Refill:  5     Encounter Diagnoses  Name Primary?  . Biceps tendinitis of right upper extremity Yes  . Arthralgia of right acromioclavicular joint      Avoid heavy lifting  Use ice 3 times a day and apply blue emu cream to both arms 3 times a day

## 2019-07-03 NOTE — Patient Instructions (Addendum)
It seems this time we have some biceps tendinitis  We have injected that and started her on meloxicam   Avoid heavy lifting  Use ice 3 times a day and apply blue emu cream to both arms 3 times a day  Proximal Biceps Tendinitis and Tenosynovitis  The proximal biceps tendon is a strong cord of tissue that connects the biceps muscle on the front of the upper arm to the shoulder blade. Tendinitis is inflammation of a tendon. Tenosynovitis is inflammation of the lining around the tendon (tendon sheath). These conditions often occur at the same time, and they can interfere with the ability to bend the elbow and turn the palm of the hand up. Proximal biceps tendinitis and tenosynovitis are usually caused by overusing the shoulder joint and the biceps muscle. These conditions usually heal within 6 weeks. Proximal biceps tendinitis may include a grade 1 or grade 2 strain of the tendon.  A grade 1 strain is mild, and it involves a slight pull of the tendon without any stretching or noticeable tearing of the tendon. There is usually no loss of biceps muscle strength.  A grade 2 strain is moderate, and it involves a small tear in the tendon. The tendon is stretched, and biceps strength is usually decreased. What are the causes? This condition may be caused by:  A sudden increase in frequency or intensity of activity that involves the shoulder and the biceps muscle.  Overuse of the biceps muscle. This can happen when you do the same movements over and over, such as: ? Turning the palm of the hand up. ? Forceful straightening (hyperextension) of the elbow. ? Bending the elbow.  A direct, forceful hit or injury to the elbow. This is rare. What increases the risk? The following factors may make you more likely to develop this condition:  Playing contact sports.  Playing sports that involve throwing and overhead movements, including racket sports, gymnastics, weight lifting, or bodybuilding.  Doing  physical labor.  Having poor strength and flexibility of the arm and shoulder. What are the signs or symptoms? Symptoms of this condition may include:  Pain and inflammation in the front of the shoulder.  A feeling of warmth in the front of the shoulder.  Limited range of motion of the shoulder and the elbow.  A crackling sound (crepitation) when you move or touch the shoulder or the upper arm. In some cases, symptoms may return after treatment, and they may be long-lasting (chronic). How is this diagnosed? This condition is diagnosed based on:  Your symptoms.  Your medical history.  Physical exam.  X-ray or MRI, if needed. How is this treated? Treatment for this condition depends on the severity of your injury. It may include:  Resting the injured arm.  Icing the injured area.  Doing physical therapy. Your health care provider may also use:  Medicines to treat pain and inflammation.  Sound waves to treat the injured muscle (ultrasound therapy).  Medicines that are injected to the muscle (corticosteroids).  Medicines that numb the area (local anesthetics).  Surgery. This is done if other treatments have not worked. Follow these instructions at home: Managing pain, stiffness, and swelling      If directed, put ice on the injured area. ? Put ice in a plastic bag. ? Place a towel between your skin and the bag. ? Leave the ice on for 20 minutes, 2-3 times a day.  If directed, apply heat to the affected area before you exercise.  Use the heat source that your health care provider recommends, such as a moist heat pack or a heating pad. ? Place a towel between your skin and the heat source. ? Leave the heat on for 20-30 minutes. ? Remove the heat if your skin turns bright red. This is especially important if you are unable to feel pain, heat, or cold. You may have a greater risk of getting burned.  Move your fingers often to reduce stiffness and swelling.  Raise  (elevate) the injured area above the level of your heart while you are lying down. Activity  Do not lift anything that is heavier than 10 lb (4.5 kg), or the limit that you are told, until your health care provider says that it is safe.  Avoid activities that cause pain or make your condition worse.  Return to your normal activities as told by your health care provider. Ask your health care provider what activities are safe for you.  Do exercises as told by your health care provider. General instructions  Take over-the-counter and prescription medicines only as told by your health care provider.  Do not use any products that contain nicotine or tobacco, such as cigarettes, e-cigarettes, and chewing tobacco. These can delay healing. If you need help quitting, ask your health care provider.  Keep all follow-up visits as told by your health care provider. This is important. How is this prevented?  Warm up and stretch before being active.  Cool down and stretch after being active.  Give your body time to rest between periods of activity.  Make sure any equipment that you use is fitted to you.  Be safe and responsible while being active to avoid falls.  Maintain physical fitness, including: ? Strength. ? Flexibility. ? Heart health (cardiovascular fitness). ? The ability to use muscles for a long time (endurance). Contact a health care provider if:  You have symptoms that get worse or do not get better after 2 weeks of treatment.  You develop new symptoms. Get help right away if:  You develop severe pain. Summary  Tendinitis is inflammation of the biceps tendon. Tenosynovitis is inflammation of the lining around the biceps tendon. These conditions often occur at the same time.  These conditions are usually caused by overusing the shoulder joint and biceps muscle.  Symptoms include pain, warmth in the shoulder, and limited range of motion.  The two conditions are treated  with rest, ice, medicines, and surgery (rare). This information is not intended to replace advice given to you by your health care provider. Make sure you discuss any questions you have with your health care provider. Document Released: 06/20/2005 Document Revised: 10/16/2018 Document Reviewed: 08/16/2018 Elsevier Patient Education  2020 [employer].

## 2019-07-11 NOTE — Telephone Encounter (Signed)
error 

## 2019-08-02 ENCOUNTER — Ambulatory Visit: Payer: Medicare HMO

## 2019-08-02 ENCOUNTER — Other Ambulatory Visit: Payer: Self-pay

## 2019-08-02 ENCOUNTER — Ambulatory Visit: Payer: Medicare HMO | Admitting: Orthopedic Surgery

## 2019-08-02 ENCOUNTER — Encounter: Payer: Self-pay | Admitting: Orthopedic Surgery

## 2019-08-02 VITALS — BP 154/84 | HR 87 | Ht 65.0 in | Wt 210.0 lb

## 2019-08-02 DIAGNOSIS — M25512 Pain in left shoulder: Secondary | ICD-10-CM | POA: Diagnosis not present

## 2019-08-02 DIAGNOSIS — G8929 Other chronic pain: Secondary | ICD-10-CM

## 2019-08-02 DIAGNOSIS — M25511 Pain in right shoulder: Secondary | ICD-10-CM

## 2019-08-02 DIAGNOSIS — M7521 Bicipital tendinitis, right shoulder: Secondary | ICD-10-CM

## 2019-08-02 NOTE — Progress Notes (Signed)
Chief Complaint  Patient presents with  . Shoulder Pain    right feels better, now c/o pain with left shoulder     69 year old female received 2 injections in the right shoulder one in the [facility] joint and one in the bicipital groove.  We also advised her to use some topical medications and she did take some oral anti-inflammatories which include meloxicam.  She presents today with left shoulder pain which she did have in her last visit on December 30.  The pain in the left shoulder is more posterior it is associated with some forward elevation and radiates down towards but not below the elbow  She denies any trauma Review of Systems  Respiratory: Negative for shortness of breath.   Cardiovascular: Negative for chest pain.  Neurological: Negative for tingling.     Past Medical History:  Diagnosis Date  . Acid reflux   . Anxiety    xanax as needed  . Arthritis    oa and pain both knees  . Atypical chest pain 5 / 2014   DISCHARGE SUMMARY [place] - RULLED OUT FOR MI, NEGATIVE NUCLEAR STRESS TEST - PAIN ATTRIBUTED TO PT'S GERD- PT STATES NO FURTHER CHEST PAINS - [place] HAS HELPED PAIN  . Cyst in hand    ?? CYST BACK OF LEFT HAND - PT STATES THE AREA POPPED UP - RAISED AREA - SOMETIMES PAINFUL - HAS A BRACE TO WEAR ON THAT HAND WHEN SLEEPING  . Hemorrhoids   . HTN (hypertension)   . Hyperlipidemia   . Tinnitus   . Transfusion history 1978   after childbirth - not all of placenta passed and pt started bleeding  . Urticaria   . Uterine fibroid    no problems from the fibroids  . Varicose veins    Allergies  Allergen Reactions  . Amoxicillin Nausea And Vomiting and Rash  . Augmentin [Amoxicillin-Pot Clavulanate] Nausea And Vomiting       . Ciprofloxacin Nausea And Vomiting  . Codeine Nausea And Vomiting and Rash  . Latex Rash    Pt states some tape ok  . Mobic [Meloxicam] Other (See Comments)    Constipation  . Sulfonamide Derivatives Nausea And Vomiting and Rash    Physical  Exam  General appearance is normal.  Susan Oneill is awake alert and oriented x3 mood and affect are normal.  Neurovascular exam in the left shoulder is normal.  Range of motion left shoulder  Area of tenderness superior acromion trapezium posterior subacromial space  Shoulder stable in abduction external rotation  Motor function supraspinatus internal rotators and external rotators normal  Impingement sign positive at 150  Speed test negative  X-rays today in the office left shoulder 3 views see report we do not see acute fracture dislocation subluxation see report   Encounter Diagnoses  Name Primary?  . Chronic left shoulder pain Yes  . Arthralgia of right acromioclavicular joint   . Biceps tendinitis of right upper extremity    AC joint arthritis stable reached all goals Biceps tendinitis right shoulder stable reached all goals Left shoulder pain (chronic illness with progression)  Procedure note the subacromial injection shoulder left   Verbal consent was obtained to inject the  Left   Shoulder  Timeout was completed to confirm the injection site is a subacromial space of the  left  shoulder  Medication used Depo-Medrol 40 mg and lidocaine 1% 3 cc  Anesthesia was provided by ethyl chloride  The injection was performed in the left  posterior subacromial space. After pinning the skin with alcohol and anesthetized the skin with ethyl chloride the subacromial space was injected using a 20-gauge needle. There were no complications  Sterile dressing was applied.  Follow-up in 4 to 6 weeks

## 2019-08-02 NOTE — Patient Instructions (Signed)
You have received an injection of steroids into the joint. 15% of patients will have increased pain within the 24 hours postinjection.   This is transient and will go away.   We recommend that you use ice packs on the injection site for 20 minutes every 2 hours and extra strength Tylenol 2 tablets every 8 as needed until the pain resolves.  If you continue to have pain after taking the Tylenol and using the ice please call the office for further instructions.  Next few days no heavy work with the left shoulder

## 2019-09-02 ENCOUNTER — Ambulatory Visit: Payer: Medicare HMO | Admitting: Orthopedic Surgery

## 2019-09-04 ENCOUNTER — Other Ambulatory Visit: Payer: Self-pay | Admitting: Family Medicine

## 2019-09-04 NOTE — Telephone Encounter (Signed)
Ok times one 

## 2019-09-05 ENCOUNTER — Telehealth: Payer: Self-pay

## 2019-09-05 NOTE — Telephone Encounter (Signed)
No problem.

## 2019-09-05 NOTE — Telephone Encounter (Signed)
Patient called stating she is coming in Monday for an shoulder injection and wants to know if there would be a problem with that if she gets her 2nd injection of Moderna.  Please advise

## 2019-09-09 ENCOUNTER — Ambulatory Visit (INDEPENDENT_AMBULATORY_CARE_PROVIDER_SITE_OTHER): Payer: Medicare HMO | Admitting: Orthopedic Surgery

## 2019-09-09 ENCOUNTER — Encounter: Payer: Self-pay | Admitting: Orthopedic Surgery

## 2019-09-09 ENCOUNTER — Other Ambulatory Visit: Payer: Self-pay

## 2019-09-09 VITALS — BP 151/98 | HR 102 | Ht 65.0 in | Wt 212.0 lb

## 2019-09-09 DIAGNOSIS — M25512 Pain in left shoulder: Secondary | ICD-10-CM

## 2019-09-09 DIAGNOSIS — G8929 Other chronic pain: Secondary | ICD-10-CM

## 2019-09-09 DIAGNOSIS — M9902 Segmental and somatic dysfunction of thoracic region: Secondary | ICD-10-CM | POA: Diagnosis not present

## 2019-09-09 DIAGNOSIS — M25511 Pain in right shoulder: Secondary | ICD-10-CM | POA: Diagnosis not present

## 2019-09-09 DIAGNOSIS — S134XXA Sprain of ligaments of cervical spine, initial encounter: Secondary | ICD-10-CM | POA: Diagnosis not present

## 2019-09-09 DIAGNOSIS — M9901 Segmental and somatic dysfunction of cervical region: Secondary | ICD-10-CM | POA: Diagnosis not present

## 2019-09-09 DIAGNOSIS — M9903 Segmental and somatic dysfunction of lumbar region: Secondary | ICD-10-CM | POA: Diagnosis not present

## 2019-09-09 DIAGNOSIS — S335XXA Sprain of ligaments of lumbar spine, initial encounter: Secondary | ICD-10-CM | POA: Diagnosis not present

## 2019-09-09 DIAGNOSIS — S233XXA Sprain of ligaments of thoracic spine, initial encounter: Secondary | ICD-10-CM | POA: Diagnosis not present

## 2019-09-09 NOTE — Patient Instructions (Addendum)
You have received an injection of steroids into the joint. 15% of patients will have increased pain within the 24 hours postinjection.   This is transient and will go away.   We recommend that you use ice packs on the injection site for 20 minutes every 2 hours and extra strength Tylenol 2 tablets every 8 as needed until the pain resolves.  If you continue to have pain after taking the Tylenol and using the ice please call the office for further instructions.   Shoulder Range of Motion Exercises Shoulder range of motion (ROM) exercises are done to keep the shoulder moving freely or to increase movement. They are often recommended for people who have shoulder pain or stiffness or who are recovering from a shoulder surgery. Phase 1 exercises When you are able, do this exercise 1-2 times per day for 30-60 seconds in each direction, or as directed by your health care provider. Pendulum exercise To do this exercise while sitting: 1. Sit in a chair or at the edge of your bed with your feet flat on the floor. 2. Let your affected arm hang down in front of you over the edge of the bed or chair. 3. Relax your shoulder, arm, and hand. [place] your body so your arm gently swings in small circles. You can also use your unaffected arm to start the motion. 5. Repeat changing the direction of the circles, swinging your arm left and right, and swinging your arm forward and back. To do this exercise while standing: 1. Stand next to a sturdy chair or table, and hold on to it with your hand on your unaffected side. 2. Bend forward at the waist. 3. Bend your knees slightly. 4. Relax your shoulder, arm, and hand. 5. While keeping your shoulder relaxed, use body motion to swing your arm in small circles. 6. Repeat changing the direction of the circles, swinging your arm left and right, and swinging your arm forward and back. 7. Between exercises, stand up tall and take a short break to relax your lower  back.  Phase 2 exercises Do these exercises 1-2 times per day or as told by your health care provider. Hold each stretch for 30 seconds, and repeat 3 times. Do the exercises with one or both arms as instructed by your health care provider. For these exercises, sit at a table with your hand and arm supported by the table. A chair that slides easily or has wheels can be helpful. External rotation 1. Turn your chair so that your affected side is nearest to the table. 2. Place your forearm on the table to your side. Bend your elbow about 90 at the elbow (right angle) and place your hand palm facing down on the table. Your elbow should be about 6 inches away from your side. 3. Keeping your arm on the table, lean your body forward. Abduction 1. Turn your chair so that your affected side is nearest to the table. 2. Place your forearm and hand on the table so that your thumb points toward the ceiling and your arm is straight out to your side. 3. Slide your hand out to the side and away from you, using your unaffected arm to do the work. 4. To increase the stretch, you can slide your chair away from the table. Flexion: forward stretch 1. Sit facing the table. Place your hand and elbow on the table in front of you. 2. Slide your hand forward and away from you, using your  unaffected arm to do the work. 3. To increase the stretch, you can slide your chair backward. Phase 3 exercises Do these exercises 1-2 times per day or as told by your health care provider. Hold each stretch for 30 seconds, and repeat 3 times. Do the exercises with one or both arms as instructed by your health care provider. Cross-body stretch: posterior capsule stretch 1. Lift your arm straight out in front of you. 2. Bend your arm 90 at the elbow (right angle) so your forearm moves across your body. 3. Use your other arm to gently pull the elbow across your body, toward your other shoulder. Wall climbs 1. Stand with your affected  arm extended out to the side with your hand resting on a door frame. 2. Slide your hand slowly up the door frame. 3. To increase the stretch, step through the door frame. Keep your body upright and do not lean. Wand exercises You will need a cane, a piece of PVC pipe, or a sturdy wooden dowel for wand exercises. Flexion To do this exercise while standing: 1. Hold the wand with both of your hands, palms down. 2. Using the other arm to help, lift your arms up and over your head, if able. 3. Push upward with your other arm to gently increase the stretch. To do this exercise while lying down: 1. Lie on your back with your elbows resting on the floor and the wand in both your hands. Your hands will be palm down, or pointing toward your feet. 2. Lift your hands toward the ceiling, using your unaffected arm to help if needed. 3. Bring your arms overhead as able, using your unaffected arm to help if needed. Internal rotation 1. Stand while holding the wand behind you with both hands. Your unaffected arm should be extended above your head with the arm of the affected side extended behind you at the level of your waist. The wand should be pointing straight up and down as you hold it. 2. Slowly pull the wand up behind your back by straightening the elbow of your unaffected arm and bending the elbow of your affected arm. External rotation 1. Lie on your back with your affected upper arm supported on a small pillow or rolled towel. When you first do this exercise, keep your upper arm close to your body. Over time, bring your arm up to a 90 angle out to the side. 2. Hold the wand across your stomach and with both hands palm up. Your elbow on your affected side should be bent at a 90 angle. 3. Use your unaffected side to help push your forearm away from you and toward the floor. Keep your elbow on your affected side bent at a 90 angle. Contact a health care provider if you have:  New or increasing  pain.  New numbness, tingling, weakness, or discoloration in your arm or hand. This information is not intended to replace advice given to you by your health care provider. Make sure you discuss any questions you have with your health care provider. Document Revised: 08/02/2017 Document Reviewed: 08/02/2017 Elsevier Patient Education  2020 [place] have received an injection of steroids into the joint. 15% of patients will have increased pain within the 24 hours postinjection.   This is transient and will go away.   We recommend that you use ice packs on the injection site for 20 minutes every 2 hours and extra strength Tylenol 2 tablets every 8 as  needed until the pain resolves.  If you continue to have pain after taking the Tylenol and using the ice please call the office for further instructions.

## 2019-09-09 NOTE — Progress Notes (Signed)
Chief Complaint  Patient presents with  . Shoulder Pain    left/ okay right is very painful at night    69 year old female followed for bilateral shoulder pain on last visit she had pain in the left shoulder which has gotten somewhat better but still persist, she has had more pain now in the right shoulder  Exam shows good strength in empty can position but pain and mild weakness in abduction actually in both shoulders  Impingement signs are mildly positive the Hawkins sign is asymptomatic  Recommend bilateral injections and reemphasize home exercise program  Encounter Diagnoses  Name Primary?  . Chronic pain in right shoulder Yes  . Chronic pain in left shoulder     Procedure injection right shoulder subacromial joint and left shoulder subacromial joint   procedure note the subacromial injection shoulder RIGHT  Verbal consent was obtained to inject the  RIGHT   Shoulder  Timeout was completed to confirm the injection site is a subacromial space of the  RIGHT  shoulder   Medication used Depo-Medrol 40 mg and lidocaine 1% 3 cc  Anesthesia was provided by ethyl chloride  The injection was performed in the RIGHT  posterior subacromial space. After pinning the skin with alcohol and anesthetized the skin with ethyl chloride the subacromial space was injected using a 20-gauge needle. There were no complications  Sterile dressing was applied.    Procedure note the subacromial injection shoulder left   Verbal consent was obtained to inject the  Left   Shoulder  Timeout was completed to confirm the injection site is a subacromial space of the  left  shoulder  Medication used Depo-Medrol 40 mg and lidocaine 1% 3 cc  Anesthesia was provided by ethyl chloride  The injection was performed in the left  posterior subacromial space. After pinning the skin with alcohol and anesthetized the skin with ethyl chloride the subacromial space was injected using a 20-gauge needle. There were no  complications  Sterile dressing was applied.

## 2019-09-10 DIAGNOSIS — M9901 Segmental and somatic dysfunction of cervical region: Secondary | ICD-10-CM | POA: Diagnosis not present

## 2019-09-10 DIAGNOSIS — M9903 Segmental and somatic dysfunction of lumbar region: Secondary | ICD-10-CM | POA: Diagnosis not present

## 2019-09-10 DIAGNOSIS — S233XXA Sprain of ligaments of thoracic spine, initial encounter: Secondary | ICD-10-CM | POA: Diagnosis not present

## 2019-09-10 DIAGNOSIS — S134XXA Sprain of ligaments of cervical spine, initial encounter: Secondary | ICD-10-CM | POA: Diagnosis not present

## 2019-09-10 DIAGNOSIS — S335XXA Sprain of ligaments of lumbar spine, initial encounter: Secondary | ICD-10-CM | POA: Diagnosis not present

## 2019-09-10 DIAGNOSIS — M9902 Segmental and somatic dysfunction of thoracic region: Secondary | ICD-10-CM | POA: Diagnosis not present

## 2019-09-11 ENCOUNTER — Other Ambulatory Visit: Payer: Self-pay | Admitting: Family Medicine

## 2019-09-11 NOTE — Telephone Encounter (Signed)
1 refill yet does need to do virtual visit please

## 2019-09-11 NOTE — Telephone Encounter (Signed)
03/28/19 last med check up

## 2019-09-12 NOTE — Telephone Encounter (Signed)
Please schedule and then route back.  

## 2019-09-12 NOTE — Telephone Encounter (Signed)
Pt has appt scheduled 3/24

## 2019-09-16 DIAGNOSIS — M9902 Segmental and somatic dysfunction of thoracic region: Secondary | ICD-10-CM | POA: Diagnosis not present

## 2019-09-16 DIAGNOSIS — M9903 Segmental and somatic dysfunction of lumbar region: Secondary | ICD-10-CM | POA: Diagnosis not present

## 2019-09-16 DIAGNOSIS — M9901 Segmental and somatic dysfunction of cervical region: Secondary | ICD-10-CM | POA: Diagnosis not present

## 2019-09-16 DIAGNOSIS — S335XXA Sprain of ligaments of lumbar spine, initial encounter: Secondary | ICD-10-CM | POA: Diagnosis not present

## 2019-09-16 DIAGNOSIS — S134XXA Sprain of ligaments of cervical spine, initial encounter: Secondary | ICD-10-CM | POA: Diagnosis not present

## 2019-09-16 DIAGNOSIS — S233XXA Sprain of ligaments of thoracic spine, initial encounter: Secondary | ICD-10-CM | POA: Diagnosis not present

## 2019-09-19 DIAGNOSIS — M9902 Segmental and somatic dysfunction of thoracic region: Secondary | ICD-10-CM | POA: Diagnosis not present

## 2019-09-19 DIAGNOSIS — S134XXA Sprain of ligaments of cervical spine, initial encounter: Secondary | ICD-10-CM | POA: Diagnosis not present

## 2019-09-19 DIAGNOSIS — S233XXA Sprain of ligaments of thoracic spine, initial encounter: Secondary | ICD-10-CM | POA: Diagnosis not present

## 2019-09-19 DIAGNOSIS — M9903 Segmental and somatic dysfunction of lumbar region: Secondary | ICD-10-CM | POA: Diagnosis not present

## 2019-09-19 DIAGNOSIS — M9901 Segmental and somatic dysfunction of cervical region: Secondary | ICD-10-CM | POA: Diagnosis not present

## 2019-09-19 DIAGNOSIS — S335XXA Sprain of ligaments of lumbar spine, initial encounter: Secondary | ICD-10-CM | POA: Diagnosis not present

## 2019-09-23 DIAGNOSIS — S335XXA Sprain of ligaments of lumbar spine, initial encounter: Secondary | ICD-10-CM | POA: Diagnosis not present

## 2019-09-23 DIAGNOSIS — M9903 Segmental and somatic dysfunction of lumbar region: Secondary | ICD-10-CM | POA: Diagnosis not present

## 2019-09-23 DIAGNOSIS — M9901 Segmental and somatic dysfunction of cervical region: Secondary | ICD-10-CM | POA: Diagnosis not present

## 2019-09-23 DIAGNOSIS — M9902 Segmental and somatic dysfunction of thoracic region: Secondary | ICD-10-CM | POA: Diagnosis not present

## 2019-09-23 DIAGNOSIS — S134XXA Sprain of ligaments of cervical spine, initial encounter: Secondary | ICD-10-CM | POA: Diagnosis not present

## 2019-09-23 DIAGNOSIS — S233XXA Sprain of ligaments of thoracic spine, initial encounter: Secondary | ICD-10-CM | POA: Diagnosis not present

## 2019-09-25 ENCOUNTER — Ambulatory Visit (INDEPENDENT_AMBULATORY_CARE_PROVIDER_SITE_OTHER): Payer: Medicare HMO | Admitting: Family Medicine

## 2019-09-25 ENCOUNTER — Other Ambulatory Visit: Payer: Self-pay

## 2019-09-25 ENCOUNTER — Encounter: Payer: Self-pay | Admitting: Family Medicine

## 2019-09-25 DIAGNOSIS — F419 Anxiety disorder, unspecified: Secondary | ICD-10-CM | POA: Diagnosis not present

## 2019-09-25 DIAGNOSIS — E785 Hyperlipidemia, unspecified: Secondary | ICD-10-CM | POA: Diagnosis not present

## 2019-09-25 DIAGNOSIS — M170 Bilateral primary osteoarthritis of knee: Secondary | ICD-10-CM

## 2019-09-25 DIAGNOSIS — I10 Essential (primary) hypertension: Secondary | ICD-10-CM | POA: Diagnosis not present

## 2019-09-25 HISTORY — DX: Anxiety disorder, unspecified: F41.9

## 2019-09-25 MED ORDER — HYDROCHLOROTHIAZIDE 25 MG PO TABS: 25.0000 mg | ORAL_TABLET | Freq: Every day | ORAL | 1 refills | Status: DC

## 2019-09-25 MED ORDER — TRAMADOL HCL 50 MG PO TABS: 50.0000 mg | ORAL_TABLET | Freq: Four times a day (QID) | ORAL | 5 refills | Status: DC

## 2019-09-25 MED ORDER — LISINOPRIL 40 MG PO TABS: ORAL_TABLET | ORAL | 1 refills | Status: DC

## 2019-09-25 MED ORDER — NAPROXEN 500 MG PO TABS: ORAL_TABLET | ORAL | 1 refills | Status: DC

## 2019-09-25 MED ORDER — ALPRAZOLAM 0.5 MG PO TABS: ORAL_TABLET | ORAL | 5 refills | Status: DC

## 2019-09-25 NOTE — Progress Notes (Signed)
   Subjective:    Patient ID: Susan Oneill, female    DOB: 1951/01/22, 69 y.o.   MRN: [id number]  Hypertension This is a chronic problem. Compliance problems: not much exercise, tries to eat healthy, takes meds every day.   Patient does best she can take in her medication watching diet staying active she is recently retired so she plans on walking 3 days a week very difficult to do a lot of activity because of osteoarthritis of her knees she also has reflux related issues but states no dysphagia or discomforts with swallowing and has hyperlipidemia tries to be careful with her diet Virtual Visit via Telephone Note  I connected with Susan Oneill on 09/25/19 at  3:00 PM EDT by telephone and verified that I am speaking with the correct person using two identifiers.  Location: Patient: home Provider: office   I discussed the limitations, risks, security and privacy concerns of performing an evaluation and management service by telephone and the availability of in person appointments. I also discussed with the patient that there may be a patient responsible charge related to this service. The patient expressed understanding and agreed to proceed.   History of Present Illness:    Observations/Objective:   Assessment and Plan:   Follow Up Instructions:    I discussed the assessment and treatment plan with the patient. The patient was provided an opportunity to ask questions and all were answered. The patient agreed with the plan and demonstrated an understanding of the instructions.   The patient was advised to call back or seek an in-person evaluation if the symptoms worsen or if the condition fails to improve as anticipated.  I provided 20 minutes of non-face-to-face time during this encounter.       Review of Systems     Objective:   Physical Exam  Unable to do exam because of virtual      Assessment & Plan:  1. Essential hypertension Blood pressure  reportedly good control continue current measures watch diet  2. Hyperlipidemia, unspecified hyperlipidemia type Hyperlipidemia previous labs overall acceptable new labs later this year follow-up in person within 6 months  3. Osteoarthritis of both knees, unspecified osteoarthritis type Tramadol patient does not abuse it takes no more than 4/day warning signs regarding drowsiness were discussed.  Patient not to over utilize medication never to drive if drowsy  4. Anxiousness Xanax as needed at nighttime to help with rest may use during the daytime when necessary caution drowsiness

## 2019-09-26 NOTE — Progress Notes (Signed)
Pt added to reminder file 

## 2019-09-27 DIAGNOSIS — S335XXA Sprain of ligaments of lumbar spine, initial encounter: Secondary | ICD-10-CM | POA: Diagnosis not present

## 2019-09-27 DIAGNOSIS — S233XXA Sprain of ligaments of thoracic spine, initial encounter: Secondary | ICD-10-CM | POA: Diagnosis not present

## 2019-09-27 DIAGNOSIS — M9902 Segmental and somatic dysfunction of thoracic region: Secondary | ICD-10-CM | POA: Diagnosis not present

## 2019-09-27 DIAGNOSIS — M9903 Segmental and somatic dysfunction of lumbar region: Secondary | ICD-10-CM | POA: Diagnosis not present

## 2019-09-27 DIAGNOSIS — M9901 Segmental and somatic dysfunction of cervical region: Secondary | ICD-10-CM | POA: Diagnosis not present

## 2019-09-27 DIAGNOSIS — S134XXA Sprain of ligaments of cervical spine, initial encounter: Secondary | ICD-10-CM | POA: Diagnosis not present

## 2019-10-04 DIAGNOSIS — S134XXA Sprain of ligaments of cervical spine, initial encounter: Secondary | ICD-10-CM | POA: Diagnosis not present

## 2019-10-04 DIAGNOSIS — M9903 Segmental and somatic dysfunction of lumbar region: Secondary | ICD-10-CM | POA: Diagnosis not present

## 2019-10-04 DIAGNOSIS — M9902 Segmental and somatic dysfunction of thoracic region: Secondary | ICD-10-CM | POA: Diagnosis not present

## 2019-10-04 DIAGNOSIS — M9901 Segmental and somatic dysfunction of cervical region: Secondary | ICD-10-CM | POA: Diagnosis not present

## 2019-10-04 DIAGNOSIS — S233XXA Sprain of ligaments of thoracic spine, initial encounter: Secondary | ICD-10-CM | POA: Diagnosis not present

## 2019-10-04 DIAGNOSIS — S335XXA Sprain of ligaments of lumbar spine, initial encounter: Secondary | ICD-10-CM | POA: Diagnosis not present

## 2019-10-18 DIAGNOSIS — S233XXA Sprain of ligaments of thoracic spine, initial encounter: Secondary | ICD-10-CM | POA: Diagnosis not present

## 2019-10-18 DIAGNOSIS — M9902 Segmental and somatic dysfunction of thoracic region: Secondary | ICD-10-CM | POA: Diagnosis not present

## 2019-10-18 DIAGNOSIS — S134XXA Sprain of ligaments of cervical spine, initial encounter: Secondary | ICD-10-CM | POA: Diagnosis not present

## 2019-10-18 DIAGNOSIS — S335XXA Sprain of ligaments of lumbar spine, initial encounter: Secondary | ICD-10-CM | POA: Diagnosis not present

## 2019-10-18 DIAGNOSIS — M9903 Segmental and somatic dysfunction of lumbar region: Secondary | ICD-10-CM | POA: Diagnosis not present

## 2019-10-18 DIAGNOSIS — M9901 Segmental and somatic dysfunction of cervical region: Secondary | ICD-10-CM | POA: Diagnosis not present

## 2019-10-21 ENCOUNTER — Ambulatory Visit: Payer: Medicare HMO | Admitting: Orthopedic Surgery

## 2019-10-25 ENCOUNTER — Ambulatory Visit: Payer: Medicare HMO | Admitting: Family Medicine

## 2019-11-15 DIAGNOSIS — M9902 Segmental and somatic dysfunction of thoracic region: Secondary | ICD-10-CM | POA: Diagnosis not present

## 2019-11-15 DIAGNOSIS — M9901 Segmental and somatic dysfunction of cervical region: Secondary | ICD-10-CM | POA: Diagnosis not present

## 2019-11-15 DIAGNOSIS — M9903 Segmental and somatic dysfunction of lumbar region: Secondary | ICD-10-CM | POA: Diagnosis not present

## 2019-11-15 DIAGNOSIS — S335XXA Sprain of ligaments of lumbar spine, initial encounter: Secondary | ICD-10-CM | POA: Diagnosis not present

## 2019-11-15 DIAGNOSIS — S233XXA Sprain of ligaments of thoracic spine, initial encounter: Secondary | ICD-10-CM | POA: Diagnosis not present

## 2019-11-15 DIAGNOSIS — S134XXA Sprain of ligaments of cervical spine, initial encounter: Secondary | ICD-10-CM | POA: Diagnosis not present

## 2019-12-10 ENCOUNTER — Ambulatory Visit: Payer: Medicare HMO | Admitting: Family Medicine

## 2019-12-10 ENCOUNTER — Encounter: Payer: Self-pay | Admitting: Family Medicine

## 2019-12-10 ENCOUNTER — Other Ambulatory Visit: Payer: Self-pay

## 2019-12-10 DIAGNOSIS — M7552 Bursitis of left shoulder: Secondary | ICD-10-CM

## 2019-12-10 DIAGNOSIS — M7551 Bursitis of right shoulder: Secondary | ICD-10-CM | POA: Insufficient documentation

## 2019-12-10 MED ORDER — GABAPENTIN 100 MG PO CAPS: 200.0000 mg | ORAL_CAPSULE | Freq: Every day | ORAL | 3 refills | Status: DC

## 2019-12-10 MED ORDER — GABAPENTIN 100 MG PO CAPS: 200.0000 mg | ORAL_CAPSULE | Freq: Every day | ORAL | 0 refills | Status: DC

## 2019-12-10 NOTE — Progress Notes (Signed)
[facility] [address] [place] Phone: [phone] Subjective:   I Susan Oneill am serving as a Education administrator for Dr. Hulan Saas.  This visit occurred during the SARS-CoV-2 public health emergency.  Safety protocols were in place, including screening questions prior to the visit, additional usage of staff PPE, and extensive cleaning of exam room while observing appropriate contact time as indicated for disinfecting solutions.   I'm seeing this patient by the request  of:  Luking, Elayne Snare, MD  CC: bilateral shoulder pain   [id number]  Susan Oneill is a 69 y.o. female coming in with complaint of bilateral shoulder pain. Right is worse. Pain radiates into the right side of her neck and down to the elbow. Left sided pain is posterior. Just retired from a job that required a lot of overhead movement.   Onset- chronic  Location- right side of neck that radiates to the elbow, posterior left shoulder pain  Duration- worse at night  Character- throbbing  Aggravating factors- sitting, laying on her side  Therapies tried- shoulder injection, ice, exercises  Severity-  10/10 at its worse       Past Medical History:  Diagnosis Date  . Acid reflux   . Anxiety    xanax as needed  . Anxiousness 09/25/2019  . Arthritis    oa and pain both knees  . Atypical chest pain 5 / 2014   DISCHARGE SUMMARY [place] - RULLED OUT FOR MI, NEGATIVE NUCLEAR STRESS TEST - PAIN ATTRIBUTED TO PT'S GERD- PT STATES NO FURTHER CHEST PAINS - [place] HAS HELPED PAIN  . Cyst in hand    ?? CYST BACK OF LEFT HAND - PT STATES THE AREA POPPED UP - RAISED AREA - SOMETIMES PAINFUL - HAS A BRACE TO WEAR ON THAT HAND WHEN SLEEPING  . Hemorrhoids   . HTN (hypertension)   . Hyperlipidemia   . Tinnitus   . Transfusion history 1978   after childbirth - not all of placenta passed and pt started bleeding  . Urticaria   . Uterine fibroid    no problems from the fibroids  .  Varicose veins    Past Surgical History:  Procedure Laterality Date  . CARPAL TUNNEL RELEASE     bilateral  . DILATATION & CURETTAGE/HYSTEROSCOPY WITH MYOSURE N/A 09/11/2018   Procedure: DILATATION & CURETTAGE/HYSTEROSCOPY WITH MYOSURE;  Surgeon: Donnamae Jude, MD;  Location: [facility];  Service: Gynecology;  Laterality: N/A;  . HEMORROIDECTOMY    . HERNIA REPAIR  8299   umbilical hernia repair  . KNEE SURGERY     bilateral knee arthroscopy- rt knee sept 2011, left knee march 2010  . TOTAL KNEE ARTHROPLASTY Right 10/21/2013   Procedure: RIGHT TOTAL KNEE ARTHROPLASTY;  Surgeon: Gearlean Alf, MD;  Location: [facility];  Service: Orthopedics;  Laterality: Right;   Social History   Socioeconomic History  . Marital status: Married    Spouse name: Not on file  . Number of children: Not on file  . Years of education: 74  . Highest education level: Not on file  Occupational History  . Occupation: Psychologist, educational  Tobacco Use  . Smoking status: Never Smoker  . Smokeless tobacco: Never Used  Substance and Sexual Activity  . Alcohol use: No  . Drug use: No  . Sexual activity: Yes  Other Topics Concern  . Not on file  Social History Narrative  . Not on file   Social Determinants  of Health   Financial Resource Strain:   . Difficulty of Paying Living Expenses:   Food Insecurity:   . Worried About Charity fundraiser in the Last Year:   . Arboriculturist in the Last Year:   Transportation Needs:   . Film/video editor (Medical):   Marland Kitchen Lack of Transportation (Non-Medical):   Physical Activity:   . Days of Exercise per Week:   . Minutes of Exercise per Session:   Stress:   . Feeling of Stress :   Social Connections:   . Frequency of Communication with Friends and Family:   . Frequency of Social Gatherings with Friends and Family:   . Attends Religious Services:   . Active Member of Clubs or Organizations:   . Attends Archivist Meetings:   Marland Kitchen Marital  Status:    Allergies  Allergen Reactions  . Amoxicillin Nausea And Vomiting and Rash  . Augmentin [Amoxicillin-Pot Clavulanate] Nausea And Vomiting       . Ciprofloxacin Nausea And Vomiting  . Codeine Nausea And Vomiting and Rash  . Latex Rash    Pt states some tape ok  . Mobic [Meloxicam] Other (See Comments)    Constipation  . Sulfonamide Derivatives Nausea And Vomiting and Rash   Family History  Problem Relation Age of Onset  . Arthritis Other   . Heart disease Other   . Heart disease Father   . High blood pressure Father     Current Outpatient Medications (Endocrine & Metabolic):  .  medroxyPROGESTERone (PROVERA) 5 MG tablet, Take 1 tablet (5 mg total) by mouth daily.  Current Outpatient Medications (Cardiovascular):  .  hydrochlorothiazide (HYDRODIURIL) 25 MG tablet, Take 1 tablet (25 mg total) by mouth daily. Marland Kitchen  lisinopril (ZESTRIL) 40 MG tablet, TAKE 1 TABLET EVERY DAY   Current Outpatient Medications (Analgesics):  .  naproxen (NAPROSYN) 500 MG tablet, Take one tablet twice daily with a meal prn pain .  traMADol (ULTRAM) 50 MG tablet, Take 1 tablet (50 mg total) by mouth 4 (four) times daily.   Current Outpatient Medications (Other):  Marland Kitchen  ALPRAZolam (XANAX) 0.5 MG tablet, TAKE 1/2- TO 1 TABLET TWO TIMES A DAY AS NEEDED BY MOUTH. Marland Kitchen  esomeprazole (NEXIUM) 20 MG capsule, Take 20 mg by mouth daily at 12 noon. .  mometasone (ELOCON) 0.1 % cream, APPLY TO AFFECTED AREA DAILY .  gabapentin (NEURONTIN) 100 MG capsule, Take 2 capsules (200 mg total) by mouth at bedtime. .  gabapentin (NEURONTIN) 100 MG capsule, Take 2 capsules (200 mg total) by mouth at bedtime.   Reviewed prior external information including notes and imaging from  primary care provider As well as notes that were available from care everywhere and other healthcare systems.  Past medical history, social, surgical and family history all reviewed in electronic medical record.  No pertanent information  unless stated regarding to the chief complaint.   Review of Systems:  No headache, visual changes, nausea, vomiting, diarrhea, constipation, dizziness, abdominal pain, skin rash, fevers, chills, night sweats, weight loss, swollen lymph nodes, body aches, joint swelling, chest pain, shortness of breath, mood changes. POSITIVE muscle aches  Objective  Blood pressure 140/86, pulse 72, height 5\' 5"  (1.651 m), weight 222 lb (100.7 kg), SpO2 98 %.   General: No apparent distress alert and oriented x3 mood and affect normal, dressed appropriately.  HEENT: Pupils equal, extraocular movements intact  Respiratory: Patient's speak in full sentences and does not appear short of  breath  Cardiovascular: No lower extremity edema, non tender, no erythema  Neuro: Cranial nerves II through XII are intact, neurovascularly intact in all extremities with 2+ DTRs and 2+ pulses.  Gait antalgic gait Neck exam significant increase in lordosis of the cervical spine and increased kyphosis of the upper back.  Negative Spurling's noted but does have some limited range of motion of the neck in sidebending and rotation. Bilateral shoulders do show some mild atrophy of the musculature of the shoulders.  Patient does have positive impingement of the shoulders right greater than left.  Patient does have some 4-5 strength of the rotator cuff on the right compared to left.  With mild decrease in internal and external range of motion right greater than left as well.  Procedure: Real-time Ultrasound Guided Injection of right glenohumeral joint Device: GE Logiq Q7  Ultrasound guided injection is preferred based studies that show increased duration, increased effect, greater accuracy, decreased procedural pain, increased response rate with ultrasound guided versus blind injection.  Verbal informed consent obtained.  Time-out conducted.  Noted no overlying erythema, induration, or other signs of local infection.  Skin prepped in a  sterile fashion.  Local anesthesia: Topical Ethyl chloride.  With sterile technique and under real time ultrasound guidance:  Joint visualized.  23g 1  inch needle inserted posterior approach. Pictures taken for needle placement. Patient did have injection of 2 cc of 1% lidocaine, 2 cc of 0.5% Marcaine, and 1.0 cc of Kenalog 40 mg/dL. Completed without difficulty  Pain immediately resolved suggesting accurate placement of the medication.  Advised to call if fevers/chills, erythema, induration, drainage, or persistent bleeding.  Images permanently stored and available for review in the ultrasound unit.  Impression: Technically successful ultrasound guided injection.  Procedure: Real-time Ultrasound Guided Injection of left glenohumeral joint Device: GE Logiq E  Ultrasound guided injection is preferred based studies that show increased duration, increased effect, greater accuracy, decreased procedural pain, increased response rate with ultrasound guided versus blind injection.  Verbal informed consent obtained.  Time-out conducted.  Noted no overlying erythema, induration, or other signs of local infection.  Skin prepped in a sterile fashion.  Local anesthesia: Topical Ethyl chloride.  With sterile technique and under real time ultrasound guidance:  Joint visualized.  21g 2 inch needle inserted posterior approach. Pictures taken for needle placement. Patient did have injection of 2 cc of 0.5% Marcaine, and 1cc of Kenalog 40 mg/dL. Completed without difficulty  Pain immediately resolved suggesting accurate placement of the medication.  Advised to call if fevers/chills, erythema, induration, drainage, or persistent bleeding.  Images permanently stored and available for review in the ultrasound unit.  Impression: Technically successful ultrasound guided injection.   Impression and Recommendations:     The above documentation has been reviewed and is accurate and complete Lyndal Pulley,  DO       Note: This dictation was prepared with Dragon dictation along with smaller phrase technology. Any transcriptional errors that result from this process are unintentional.

## 2019-12-10 NOTE — Assessment & Plan Note (Signed)
Bilateral injections given today.  Tolerated the procedure well, discussed icing regimen and home exercise, which activities to doing which wants to avoid.  Increasing activity slowly.  Topical anti-inflammatories given.  Patient responded well to the injection.  Due to [place] not having enough manpower unable to do x-rays today.  Follow-up again in 4 to 6 weeks

## 2019-12-10 NOTE — Patient Instructions (Signed)
Good to see you  Exercise 3 times a week Gabapentin 200 mg at night Hands within peripheral vision  See me again in 4-6 weeks

## 2019-12-11 ENCOUNTER — Telehealth: Payer: Self-pay | Admitting: Family Medicine

## 2019-12-11 ENCOUNTER — Other Ambulatory Visit: Payer: Self-pay

## 2019-12-11 MED ORDER — GABAPENTIN 100 MG PO CAPS: 200.0000 mg | ORAL_CAPSULE | Freq: Every day | ORAL | 0 refills | Status: DC

## 2019-12-11 NOTE — Telephone Encounter (Signed)
Left message for patient that Rx was called into pharmacy.

## 2019-12-11 NOTE — Telephone Encounter (Signed)
Patient's husband called stating that pharmacy has not received the Gabapentin prescription that was sent yesterday.  He asked if this could be resent or called in to the pharmacy. Phone: [phone]

## 2019-12-25 ENCOUNTER — Other Ambulatory Visit: Payer: Self-pay | Admitting: *Deleted

## 2019-12-25 DIAGNOSIS — Z1211 Encounter for screening for malignant neoplasm of colon: Secondary | ICD-10-CM

## 2020-01-03 ENCOUNTER — Encounter: Payer: Self-pay | Admitting: Family Medicine

## 2020-01-09 ENCOUNTER — Encounter: Payer: Self-pay | Admitting: Internal Medicine

## 2020-01-21 ENCOUNTER — Encounter: Payer: Self-pay | Admitting: Family Medicine

## 2020-01-21 ENCOUNTER — Other Ambulatory Visit: Payer: Self-pay

## 2020-01-21 ENCOUNTER — Ambulatory Visit: Payer: Medicare HMO | Admitting: Family Medicine

## 2020-01-21 VITALS — BP 124/72 | HR 75 | Ht 65.0 in | Wt 218.0 lb

## 2020-01-21 DIAGNOSIS — M7552 Bursitis of left shoulder: Secondary | ICD-10-CM

## 2020-01-21 DIAGNOSIS — M19011 Primary osteoarthritis, right shoulder: Secondary | ICD-10-CM

## 2020-01-21 DIAGNOSIS — M7551 Bursitis of right shoulder: Secondary | ICD-10-CM | POA: Diagnosis not present

## 2020-01-21 NOTE — Assessment & Plan Note (Signed)
Injection given today.  Positive crossover and pain on the [facility] joint.  Discussed icing regimen and home exercises discussed potential physical therapy which patient will consider.  Did not do well with the gabapentin.  Hold on any other type of medication at the time.  Follow-up again in 4 to 8 weeks

## 2020-01-21 NOTE — Patient Instructions (Signed)
Good to see you. Injection given in R shoulder Message [place] in 2 weeks if not feeling better we will get you in with PT See [place] again in 6-8 weeks

## 2020-01-21 NOTE — Progress Notes (Signed)
[facility] [place] Phone: [phone] Subjective:   Rito Ehrlich, am serving as a scribe for Dr. Hulan Saas.  This visit occurred during the SARS-CoV-2 public health emergency.  Safety protocols were in place, including screening questions prior to the visit, additional usage of staff PPE, and extensive cleaning of exam room while observing appropriate contact time as indicated for disinfecting solutions.   I'm seeing this patient by the request  of:  Kathyrn Drown, MD  CC: Bilateral shoulder pain:  [id number]    Update 01/21/2020 Patient states the shots helped her shoulders but the right one is still pretty rough. Pain starts at top of shoulder and radiates down the arm to elbow, hurts worse to raise right arm and the shoulder pops. Some exercises works but some makes the pain worse.  Patient states that nothing seems to be more on the top of the shoulder.  Patient states that if she puts pressure on it or does anything overhead she seems to notice it more.  Patient denies though any significant radiation down the arm or any numbness or tingling.    Past Medical History:  Diagnosis Date  . Acid reflux   . Anxiety    xanax as needed  . Anxiousness 09/25/2019  . Arthritis    oa and pain both knees  . Atypical chest pain 5 / 2014   DISCHARGE SUMMARY [place] - RULLED OUT FOR MI, NEGATIVE NUCLEAR STRESS TEST - PAIN ATTRIBUTED TO PT'S GERD- PT STATES NO FURTHER CHEST PAINS - [place] HAS HELPED PAIN  . Cyst in hand    ?? CYST BACK OF LEFT HAND - PT STATES THE AREA POPPED UP - RAISED AREA - SOMETIMES PAINFUL - HAS A BRACE TO WEAR ON THAT HAND WHEN SLEEPING  . Hemorrhoids   . HTN (hypertension)   . Hyperlipidemia   . Tinnitus   . Transfusion history 1978   after childbirth - not all of placenta passed and pt started bleeding  . Urticaria   . Uterine fibroid    no problems from the fibroids  . Varicose veins     Past Surgical History:  Procedure Laterality Date  . CARPAL TUNNEL RELEASE     bilateral  . DILATATION & CURETTAGE/HYSTEROSCOPY WITH MYOSURE N/A 09/11/2018   Procedure: DILATATION & CURETTAGE/HYSTEROSCOPY WITH MYOSURE;  Surgeon: Donnamae Jude, MD;  Location: [facility];  Service: Gynecology;  Laterality: N/A;  . HEMORROIDECTOMY    . HERNIA REPAIR  8469   umbilical hernia repair  . KNEE SURGERY     bilateral knee arthroscopy- rt knee sept 2011, left knee march 2010  . TOTAL KNEE ARTHROPLASTY Right 10/21/2013   Procedure: RIGHT TOTAL KNEE ARTHROPLASTY;  Surgeon: Gearlean Alf, MD;  Location: [facility];  Service: Orthopedics;  Laterality: Right;   Social History   Socioeconomic History  . Marital status: Married    Spouse name: Not on file  . Number of children: Not on file  . Years of education: 86  . Highest education level: Not on file  Occupational History  . Occupation: Psychologist, educational  Tobacco Use  . Smoking status: Never Smoker  . Smokeless tobacco: Never Used  Substance and Sexual Activity  . Alcohol use: No  . Drug use: No  . Sexual activity: Yes  Other Topics Concern  . Not on file  Social History Narrative  . Not on file   Social Determinants of Health  Financial Resource Strain:   . Difficulty of Paying Living Expenses:   Food Insecurity:   . Worried About Charity fundraiser in the Last Year:   . Arboriculturist in the Last Year:   Transportation Needs:   . Film/video editor (Medical):   Marland Kitchen Lack of Transportation (Non-Medical):   Physical Activity:   . Days of Exercise per Week:   . Minutes of Exercise per Session:   Stress:   . Feeling of Stress :   Social Connections:   . Frequency of Communication with Friends and Family:   . Frequency of Social Gatherings with Friends and Family:   . Attends Religious Services:   . Active Member of Clubs or Organizations:   . Attends Archivist Meetings:   Marland Kitchen Marital Status:     Allergies  Allergen Reactions  . Amoxicillin Nausea And Vomiting and Rash  . Augmentin [Amoxicillin-Pot Clavulanate] Nausea And Vomiting       . Ciprofloxacin Nausea And Vomiting  . Codeine Nausea And Vomiting and Rash  . Latex Rash    Pt states some tape ok  . Mobic [Meloxicam] Other (See Comments)    Constipation  . Sulfonamide Derivatives Nausea And Vomiting and Rash   Family History  Problem Relation Age of Onset  . Arthritis Other   . Heart disease Other   . Heart disease Father   . High blood pressure Father     Current Outpatient Medications (Endocrine & Metabolic):  .  medroxyPROGESTERone (PROVERA) 5 MG tablet, Take 1 tablet (5 mg total) by mouth daily.  Current Outpatient Medications (Cardiovascular):  .  hydrochlorothiazide (HYDRODIURIL) 25 MG tablet, Take 1 tablet (25 mg total) by mouth daily. Marland Kitchen  lisinopril (ZESTRIL) 40 MG tablet, TAKE 1 TABLET EVERY DAY   Current Outpatient Medications (Analgesics):  .  naproxen (NAPROSYN) 500 MG tablet, Take one tablet twice daily with a meal prn pain .  traMADol (ULTRAM) 50 MG tablet, Take 1 tablet (50 mg total) by mouth 4 (four) times daily.   Current Outpatient Medications (Other):  Marland Kitchen  ALPRAZolam (XANAX) 0.5 MG tablet, TAKE 1/2- TO 1 TABLET TWO TIMES A DAY AS NEEDED BY MOUTH. Marland Kitchen  esomeprazole (NEXIUM) 20 MG capsule, Take 20 mg by mouth daily at 12 noon. .  gabapentin (NEURONTIN) 100 MG capsule, Take 2 capsules (200 mg total) by mouth at bedtime. .  gabapentin (NEURONTIN) 100 MG capsule, Take 2 capsules (200 mg total) by mouth at bedtime. .  mometasone (ELOCON) 0.1 % cream, APPLY TO AFFECTED AREA DAILY   Reviewed prior external information including notes and imaging from  primary care provider As well as notes that were available from care everywhere and other healthcare systems.  Past medical history, social, surgical and family history all reviewed in electronic medical record.  No pertanent information unless  stated regarding to the chief complaint.   Review of Systems:  No headache, visual changes, nausea, vomiting, diarrhea, constipation, dizziness, abdominal pain, skin rash, fevers, chills, night sweats, weight loss, swollen lymph nodes, body aches, joint swelling, chest pain, shortness of breath, mood changes. POSITIVE muscle aches  Objective  Blood pressure 124/72, pulse 75, height 5\' 5"  (1.651 m), weight 218 lb (98.9 kg), SpO2 98 %.   General: No apparent distress alert and oriented x3 mood and affect normal, dressed appropriately.  HEENT: Pupils equal, extraocular movements intact  Respiratory: Patient's speak in full sentences and does not appear short of breath  Cardiovascular: No  lower extremity edema, non tender, no erythema  Neuro: Cranial nerves II through XII are intact, neurovascularly intact in all extremities with 2+ DTRs and 2+ pulses.  Gait normal with good balance and coordination.  MSK: Right shoulder exam shows the patient does have more of a positive crossover.  4+ out of 5 strength of the rotator cuff compared to the contralateral side.  Mild positive Hawkins and Neer sign.  Patient does have some mild limited loss of range of motion of the neck noted but no true radicular symptoms.  After verbal consent patient was prepped with alcohol swab and with a 25-gauge half inch needle injected into the right acromioclavicular joint with a total of 0.5 cc of 0.5% Marcaine and 0.5 cc of Kenalog 40 mg/mL no blood loss.  Band-Aid placed.  Postinjection instructions given    Impression and Recommendations:     The above documentation has been reviewed and is accurate and complete Lyndal Pulley, DO       Note: This dictation was prepared with Dragon dictation along with smaller phrase technology. Any transcriptional errors that result from this process are unintentional.

## 2020-01-29 DIAGNOSIS — B5801 Toxoplasma chorioretinitis: Secondary | ICD-10-CM | POA: Diagnosis not present

## 2020-01-29 DIAGNOSIS — H35013 Changes in retinal vascular appearance, bilateral: Secondary | ICD-10-CM | POA: Diagnosis not present

## 2020-01-29 DIAGNOSIS — H35363 Drusen (degenerative) of macula, bilateral: Secondary | ICD-10-CM | POA: Diagnosis not present

## 2020-01-29 DIAGNOSIS — H35033 Hypertensive retinopathy, bilateral: Secondary | ICD-10-CM | POA: Diagnosis not present

## 2020-02-06 ENCOUNTER — Other Ambulatory Visit: Payer: Self-pay

## 2020-02-06 ENCOUNTER — Telehealth: Payer: Self-pay | Admitting: Family Medicine

## 2020-02-06 DIAGNOSIS — G8929 Other chronic pain: Secondary | ICD-10-CM

## 2020-02-06 DIAGNOSIS — M25511 Pain in right shoulder: Secondary | ICD-10-CM

## 2020-02-06 NOTE — Telephone Encounter (Signed)
Patient called stating that her right arm is still very painful. She said that Dr Tamala Julian told her to touch base in a couple weeks to let him know how she was doing.  She did not know if he wanted her to do any imaging or what would be best.  Please advise.

## 2020-02-06 NOTE — Telephone Encounter (Signed)
MRI ordered and patient notified through [facility].

## 2020-02-24 DIAGNOSIS — H43811 Vitreous degeneration, right eye: Secondary | ICD-10-CM | POA: Diagnosis not present

## 2020-02-27 ENCOUNTER — Other Ambulatory Visit: Payer: Self-pay | Admitting: Family Medicine

## 2020-03-01 ENCOUNTER — Ambulatory Visit
Admission: RE | Admit: 2020-03-01 | Discharge: 2020-03-01 | Disposition: A | Discharge status: Home / Self Care | Payer: Medicare HMO | Source: Ambulatory Visit | Attending: Family Medicine | Admitting: Family Medicine

## 2020-03-01 DIAGNOSIS — M25511 Pain in right shoulder: Secondary | ICD-10-CM

## 2020-03-01 DIAGNOSIS — G8929 Other chronic pain: Secondary | ICD-10-CM

## 2020-03-04 ENCOUNTER — Other Ambulatory Visit: Payer: Self-pay

## 2020-03-04 ENCOUNTER — Ambulatory Visit: Payer: Medicare HMO | Admitting: Gastroenterology

## 2020-03-04 ENCOUNTER — Encounter: Payer: Self-pay | Admitting: Gastroenterology

## 2020-03-04 ENCOUNTER — Telehealth: Payer: Self-pay

## 2020-03-04 VITALS — BP 155/84 | HR 81 | Temp 97.6°F | Ht 65.0 in | Wt 216.4 lb

## 2020-03-04 DIAGNOSIS — K219 Gastro-esophageal reflux disease without esophagitis: Secondary | ICD-10-CM

## 2020-03-04 DIAGNOSIS — K5900 Constipation, unspecified: Secondary | ICD-10-CM

## 2020-03-04 DIAGNOSIS — Z1211 Encounter for screening for malignant neoplasm of colon: Secondary | ICD-10-CM | POA: Diagnosis not present

## 2020-03-04 NOTE — Telephone Encounter (Signed)
PA for TCS submitted via [employer] website. Case approved. [employer]# [id number], valid 03/23/20-04/22/20.

## 2020-03-04 NOTE — Patient Instructions (Addendum)
1. Colonoscopy to be scheduled. See separate instructions.  2. For intermittent constipation, consider taking miralax one capful (17grams) each night if you have not had an adequate bowel movement during the day. You can purchase over the counter.  3. For reflux, for patients having reflux requiring medication for more than five years, you should consider an upper endoscopy to screen for a condition called Barrett's esophagus (occurs due to chronic acid in the esophagus) which is a precursor to esophageal cancer. You want to consider an upper endoscopy at some point, call and schedule a return office visit for this.

## 2020-03-04 NOTE — Progress Notes (Signed)
Primary Care Physician:  Kathyrn Drown, MD  Primary Gastroenterologist:  Elon Alas. Abbey Chatters, DO   Chief Complaint  Patient presents with  . Colonoscopy    HPI:  Susan Oneill is a 69 y.o. female here to schedule first-ever colonoscopy, at the request of Dr. Sallee Lange.  Patient has a paternal uncle who had colon cancer.  No other family members that she knows of.  Her BMs are regular for the most part.  Occasionally has constipation. No melena. Has hemorrhoids. Sometimes brbpr with hemorrhoid flares. No abdominal. No heartburn on Nexium. No dysphagia.  Has had heartburn approaching 5 years.  No prior endoscopy.  No unintentional weight loss.   Current Outpatient Medications  Medication Sig Dispense Refill  . ALPRAZolam (XANAX) 0.5 MG tablet TAKE 1/2- TO 1 TABLET BY MOUTH TWO TIMES A DAY AS NEEDED (Patient taking differently: as needed. TAKE 1/2- TO 1 TABLET BY MOUTH TWO TIMES A DAY AS NEEDED) 45 tablet 1  . aspirin 81 MG chewable tablet Chew 81 mg by mouth daily.    Marland Kitchen esomeprazole (NEXIUM) 20 MG capsule Take 20 mg by mouth daily at 12 noon.    . hydrochlorothiazide (HYDRODIURIL) 25 MG tablet Take 1 tablet (25 mg total) by mouth daily. 90 tablet 1  . lisinopril (ZESTRIL) 40 MG tablet TAKE 1 TABLET EVERY DAY 90 tablet 1  . mometasone (ELOCON) 0.1 % cream APPLY TO AFFECTED AREA DAILY (Patient taking differently: as needed. Apply to affected area daily) 45 g 1  . naproxen (NAPROSYN) 500 MG tablet Take one tablet twice daily with a meal prn pain 180 tablet 1  . traMADol (ULTRAM) 50 MG tablet Take 1 tablet (50 mg total) by mouth 4 (four) times daily. 120 tablet 5   No current facility-administered medications for this visit.    Allergies as of 03/04/2020 - Review Complete 03/04/2020  Allergen Reaction Noted  . Amoxicillin Nausea And Vomiting and Rash   . Augmentin [amoxicillin-pot clavulanate] Nausea And Vomiting 11/23/2012  . Ciprofloxacin Nausea And Vomiting 12/21/2015  . Codeine  Nausea And Vomiting and Rash   . Latex Rash 09/11/2018  . Mobic [meloxicam] Other (See Comments) 11/23/2012  . Sulfonamide derivatives Nausea And Vomiting and Rash     Past Medical History:  Diagnosis Date  . Acid reflux   . Anxiety    xanax as needed  . Anxiousness 09/25/2019  . Arthritis    oa and pain both knees  . Atypical chest pain 5 / 2014   DISCHARGE SUMMARY [place] - RULLED OUT FOR MI, NEGATIVE NUCLEAR STRESS TEST - PAIN ATTRIBUTED TO PT'S GERD- PT STATES NO FURTHER CHEST PAINS - [place] HAS HELPED PAIN  . Cyst in hand    ?? CYST BACK OF LEFT HAND - PT STATES THE AREA POPPED UP - RAISED AREA - SOMETIMES PAINFUL - HAS A BRACE TO WEAR ON THAT HAND WHEN SLEEPING  . Hemorrhoids   . HTN (hypertension)   . Hyperlipidemia   . Tinnitus   . Transfusion history 1978   after childbirth - not all of placenta passed and pt started bleeding  . Urticaria   . Uterine fibroid    no problems from the fibroids  . Varicose veins     Past Surgical History:  Procedure Laterality Date  . CARPAL TUNNEL RELEASE     bilateral  . DILATATION & CURETTAGE/HYSTEROSCOPY WITH MYOSURE N/A 09/11/2018   Procedure: DILATATION & CURETTAGE/HYSTEROSCOPY WITH MYOSURE;  Surgeon: Donnamae Jude, MD;  Location: [facility];  Service: Gynecology;  Laterality: N/A;  . HEMORROIDECTOMY    . HERNIA REPAIR  [id number]   umbilical hernia repair  . KNEE SURGERY     bilateral knee arthroscopy- rt knee sept 2011, left knee march 2010  . TOTAL KNEE ARTHROPLASTY Right 10/21/2013   Procedure: RIGHT TOTAL KNEE ARTHROPLASTY;  Surgeon: Gearlean Alf, MD;  Location: [facility];  Service: Orthopedics;  Laterality: Right;    Family History  Problem Relation Age of Onset  . Arthritis Other   . Heart disease Other   . Heart disease Father   . High blood pressure Father   . Colon cancer Paternal Uncle     Social History   Socioeconomic History  . Marital status: Married    Spouse name: Not on file  . Number of  children: Not on file  . Years of education: 71  . Highest education level: Not on file  Occupational History  . Occupation: Psychologist, educational  Tobacco Use  . Smoking status: Never Smoker  . Smokeless tobacco: Never Used  Substance and Sexual Activity  . Alcohol use: No  . Drug use: No  . Sexual activity: Yes  Other Topics Concern  . Not on file  Social History Narrative  . Not on file   Social Determinants of Health   Financial Resource Strain:   . Difficulty of Paying Living Expenses: Not on file  Food Insecurity:   . Worried About Charity fundraiser in the Last Year: Not on file  . Ran Out of Food in the Last Year: Not on file  Transportation Needs:   . Lack of Transportation (Medical): Not on file  . Lack of Transportation (Non-Medical): Not on file  Physical Activity:   . Days of Exercise per Week: Not on file  . Minutes of Exercise per Session: Not on file  Stress:   . Feeling of Stress : Not on file  Social Connections:   . Frequency of Communication with Friends and Family: Not on file  . Frequency of Social Gatherings with Friends and Family: Not on file  . Attends Religious Services: Not on file  . Active Member of Clubs or Organizations: Not on file  . Attends Archivist Meetings: Not on file  . Marital Status: Not on file  Intimate Partner Violence:   . Fear of Current or Ex-Partner: Not on file  . Emotionally Abused: Not on file  . Physically Abused: Not on file  . Sexually Abused: Not on file      ROS:  General: Negative for anorexia, weight loss, fever, chills, fatigue, weakness. Eyes: Negative for vision changes.  ENT: Negative for hoarseness, difficulty swallowing , nasal congestion. CV: Negative for chest pain, angina, palpitations, dyspnea on exertion, peripheral edema.  Respiratory: Negative for dyspnea at rest, dyspnea on exertion, cough, sputum, wheezing.  GI: See history of present illness. GU:  Negative for dysuria, hematuria,  urinary incontinence, urinary frequency, nocturnal urination.  MS: Positive for joint pain, negative low back pain.  Derm: Negative for rash or itching.  Neuro: Negative for weakness, abnormal sensation, seizure, frequent headaches, memory loss, confusion.  Psych: Negative for anxiety, depression, suicidal ideation, hallucinations.  Endo: Negative for unusual weight change.  Heme: Negative for bruising or bleeding. Allergy: Negative for rash or hives.    Physical Examination:  BP (!) 155/84   Pulse 81   Temp 97.6 F (36.4 C) (Oral)   Ht 5\' 5"  (1.651 m)  Wt 216 lb 6.4 oz (98.2 kg)   BMI 36.01 kg/m    General: Well-nourished, well-developed in no acute distress.  Head: Normocephalic, atraumatic.   Eyes: Conjunctiva pink, no icterus. Mouth: masked Neck: Supple without thyromegaly, masses, or lymphadenopathy.  Lungs: Clear to auscultation bilaterally.  Heart: Regular rate and rhythm, no murmurs rubs or gallops.  Abdomen: Bowel sounds are normal, nontender, nondistended, no hepatosplenomegaly or masses, no abdominal bruits or    hernia , no rebound or guarding.   Rectal: not performed Extremities: No lower extremity edema. No clubbing or deformities.  Neuro: Alert and oriented x 4 , grossly normal neurologically.  Skin: Warm and dry, no rash or jaundice.   Psych: Alert and cooperative, normal mood and affect.  Labs: Lab Results  Component Value Date   CREATININE 0.82 03/28/2019   BUN 17 03/28/2019   NA 138 03/28/2019   K 4.1 03/28/2019   CL 102 03/28/2019   CO2 25 03/28/2019   Lab Results  Component Value Date   ALT 11 03/28/2019   AST 18 03/28/2019   ALKPHOS 72 03/28/2019   BILITOT 0.3 03/28/2019      Imaging Studies: MR SHOULDER RIGHT WO CONTRAST  Result Date: 03/02/2020 CLINICAL DATA:  Chronic right shoulder pain.  No known injury. EXAM: MRI OF THE RIGHT SHOULDER WITHOUT CONTRAST TECHNIQUE: Multiplanar, multisequence MR imaging of the shoulder was performed.  No intravenous contrast was administered. COMPARISON:  Plain films right shoulder 05/13/2019. FINDINGS: Rotator cuff: The patient has rotator cuff tendinopathy with a complete supraspinatus tendon tear. Retraction is just medial to the top of the humeral head, 2-3 cm. The rotator cuff is otherwise intact. Muscles:  No atrophy or focal lesion. Biceps long head:  Intact. Acromioclavicular Joint: Moderate to moderately severe osteoarthritis. Type 1 acromion. There is fluid in the subacromial/subdeltoid bursa and subacromial spurring. Glenohumeral Joint: Mild degenerative change is seen. Moderate glenohumeral joint effusion contains some debris. Labrum: Posterior labrum is degenerated and diminutive. No focal tear. Bones:  No fracture, stress change or worrisome lesion. Other: None. IMPRESSION: Rotator cuff tendinopathy with a complete supraspinatus tendon tear. Retraction is 2-3 cm. No atrophy. Moderate to moderately severe acromioclavicular osteoarthritis. Subacromial spurring also noted. Subacromial/subdeltoid fluid compatible with bursitis. Mild appearing glenohumeral osteoarthritis. Glenohumeral joint effusion contains small amount of debris. Electronically Signed   By: Inge Rise M.D.   On: 03/02/2020 11:49   Impression/plan:  69 year old female presenting to schedule first-ever colonoscopy.  Outside of occasional flare of hemorrhoids and constipation, generally doing well.  Plan for colonoscopy in the near future with Dr. Abbey Chatters.  ASA 2.  I have discussed the risks, alternatives, benefits with regards to but not limited to the risk of reaction to medication, bleeding, infection, perforation and the patient is agreeable to proceed. Written consent to be obtained.  GERD: Approaching 5 years of PPI therapy for reflux.  Would consider upper endoscopy at some point to screen for Barrett's esophagus.  Patient can return to the office to arrange upper endoscopy whenever she is ready to pursue.

## 2020-03-04 NOTE — H&P (View-Only) (Signed)
Primary Care Physician:  Kathyrn Drown, MD  Primary Gastroenterologist:  Elon Alas. Abbey Chatters, DO   Chief Complaint  Patient presents with  . Colonoscopy    HPI:  Susan Oneill is a 69 y.o. female here to schedule first-ever colonoscopy, at the request of Dr. Sallee Lange.  Patient has a paternal uncle who had colon cancer.  No other family members that she knows of.  Her BMs are regular for the most part.  Occasionally has constipation. No melena. Has hemorrhoids. Sometimes brbpr with hemorrhoid flares. No abdominal. No heartburn on Nexium. No dysphagia.  Has had heartburn approaching 5 years.  No prior endoscopy.  No unintentional weight loss.   Current Outpatient Medications  Medication Sig Dispense Refill  . ALPRAZolam (XANAX) 0.5 MG tablet TAKE 1/2- TO 1 TABLET BY MOUTH TWO TIMES A DAY AS NEEDED (Patient taking differently: as needed. TAKE 1/2- TO 1 TABLET BY MOUTH TWO TIMES A DAY AS NEEDED) 45 tablet 1  . aspirin 81 MG chewable tablet Chew 81 mg by mouth daily.    Marland Kitchen esomeprazole (NEXIUM) 20 MG capsule Take 20 mg by mouth daily at 12 noon.    . hydrochlorothiazide (HYDRODIURIL) 25 MG tablet Take 1 tablet (25 mg total) by mouth daily. 90 tablet 1  . lisinopril (ZESTRIL) 40 MG tablet TAKE 1 TABLET EVERY DAY 90 tablet 1  . mometasone (ELOCON) 0.1 % cream APPLY TO AFFECTED AREA DAILY (Patient taking differently: as needed. Apply to affected area daily) 45 g 1  . naproxen (NAPROSYN) 500 MG tablet Take one tablet twice daily with a meal prn pain 180 tablet 1  . traMADol (ULTRAM) 50 MG tablet Take 1 tablet (50 mg total) by mouth 4 (four) times daily. 120 tablet 5   No current facility-administered medications for this visit.    Allergies as of 03/04/2020 - Review Complete 03/04/2020  Allergen Reaction Noted  . Amoxicillin Nausea And Vomiting and Rash   . Augmentin [amoxicillin-pot clavulanate] Nausea And Vomiting 11/23/2012  . Ciprofloxacin Nausea And Vomiting 12/21/2015  . Codeine  Nausea And Vomiting and Rash   . Latex Rash 09/11/2018  . Mobic [meloxicam] Other (See Comments) 11/23/2012  . Sulfonamide derivatives Nausea And Vomiting and Rash     Past Medical History:  Diagnosis Date  . Acid reflux   . Anxiety    xanax as needed  . Anxiousness 09/25/2019  . Arthritis    oa and pain both knees  . Atypical chest pain 5 / 2014   DISCHARGE SUMMARY [place] - RULLED OUT FOR MI, NEGATIVE NUCLEAR STRESS TEST - PAIN ATTRIBUTED TO PT'S GERD- PT STATES NO FURTHER CHEST PAINS - [place] HAS HELPED PAIN  . Cyst in hand    ?? CYST BACK OF LEFT HAND - PT STATES THE AREA POPPED UP - RAISED AREA - SOMETIMES PAINFUL - HAS A BRACE TO WEAR ON THAT HAND WHEN SLEEPING  . Hemorrhoids   . HTN (hypertension)   . Hyperlipidemia   . Tinnitus   . Transfusion history 1978   after childbirth - not all of placenta passed and pt started bleeding  . Urticaria   . Uterine fibroid    no problems from the fibroids  . Varicose veins     Past Surgical History:  Procedure Laterality Date  . CARPAL TUNNEL RELEASE     bilateral  . DILATATION & CURETTAGE/HYSTEROSCOPY WITH MYOSURE N/A 09/11/2018   Procedure: DILATATION & CURETTAGE/HYSTEROSCOPY WITH MYOSURE;  Surgeon: Donnamae Jude, MD;  Location: [facility];  Service: Gynecology;  Laterality: N/A;  . HEMORROIDECTOMY    . HERNIA REPAIR  [id number]   umbilical hernia repair  . KNEE SURGERY     bilateral knee arthroscopy- rt knee sept 2011, left knee march 2010  . TOTAL KNEE ARTHROPLASTY Right 10/21/2013   Procedure: RIGHT TOTAL KNEE ARTHROPLASTY;  Surgeon: Gearlean Alf, MD;  Location: [facility];  Service: Orthopedics;  Laterality: Right;    Family History  Problem Relation Age of Onset  . Arthritis Other   . Heart disease Other   . Heart disease Father   . High blood pressure Father   . Colon cancer Paternal Uncle     Social History   Socioeconomic History  . Marital status: Married    Spouse name: Not on file  . Number of  children: Not on file  . Years of education: 26  . Highest education level: Not on file  Occupational History  . Occupation: Psychologist, educational  Tobacco Use  . Smoking status: Never Smoker  . Smokeless tobacco: Never Used  Substance and Sexual Activity  . Alcohol use: No  . Drug use: No  . Sexual activity: Yes  Other Topics Concern  . Not on file  Social History Narrative  . Not on file   Social Determinants of Health   Financial Resource Strain:   . Difficulty of Paying Living Expenses: Not on file  Food Insecurity:   . Worried About Charity fundraiser in the Last Year: Not on file  . Ran Out of Food in the Last Year: Not on file  Transportation Needs:   . Lack of Transportation (Medical): Not on file  . Lack of Transportation (Non-Medical): Not on file  Physical Activity:   . Days of Exercise per Week: Not on file  . Minutes of Exercise per Session: Not on file  Stress:   . Feeling of Stress : Not on file  Social Connections:   . Frequency of Communication with Friends and Family: Not on file  . Frequency of Social Gatherings with Friends and Family: Not on file  . Attends Religious Services: Not on file  . Active Member of Clubs or Organizations: Not on file  . Attends Archivist Meetings: Not on file  . Marital Status: Not on file  Intimate Partner Violence:   . Fear of Current or Ex-Partner: Not on file  . Emotionally Abused: Not on file  . Physically Abused: Not on file  . Sexually Abused: Not on file      ROS:  General: Negative for anorexia, weight loss, fever, chills, fatigue, weakness. Eyes: Negative for vision changes.  ENT: Negative for hoarseness, difficulty swallowing , nasal congestion. CV: Negative for chest pain, angina, palpitations, dyspnea on exertion, peripheral edema.  Respiratory: Negative for dyspnea at rest, dyspnea on exertion, cough, sputum, wheezing.  GI: See history of present illness. GU:  Negative for dysuria, hematuria,  urinary incontinence, urinary frequency, nocturnal urination.  MS: Positive for joint pain, negative low back pain.  Derm: Negative for rash or itching.  Neuro: Negative for weakness, abnormal sensation, seizure, frequent headaches, memory loss, confusion.  Psych: Negative for anxiety, depression, suicidal ideation, hallucinations.  Endo: Negative for unusual weight change.  Heme: Negative for bruising or bleeding. Allergy: Negative for rash or hives.    Physical Examination:  BP (!) 155/84   Pulse 81   Temp 97.6 F (36.4 C) (Oral)   Ht 5\' 5"  (1.651 m)  Wt 216 lb 6.4 oz (98.2 kg)   BMI 36.01 kg/m    General: Well-nourished, well-developed in no acute distress.  Head: Normocephalic, atraumatic.   Eyes: Conjunctiva pink, no icterus. Mouth: masked Neck: Supple without thyromegaly, masses, or lymphadenopathy.  Lungs: Clear to auscultation bilaterally.  Heart: Regular rate and rhythm, no murmurs rubs or gallops.  Abdomen: Bowel sounds are normal, nontender, nondistended, no hepatosplenomegaly or masses, no abdominal bruits or    hernia , no rebound or guarding.   Rectal: not performed Extremities: No lower extremity edema. No clubbing or deformities.  Neuro: Alert and oriented x 4 , grossly normal neurologically.  Skin: Warm and dry, no rash or jaundice.   Psych: Alert and cooperative, normal mood and affect.  Labs: Lab Results  Component Value Date   CREATININE 0.82 03/28/2019   BUN 17 03/28/2019   NA 138 03/28/2019   K 4.1 03/28/2019   CL 102 03/28/2019   CO2 25 03/28/2019   Lab Results  Component Value Date   ALT 11 03/28/2019   AST 18 03/28/2019   ALKPHOS 72 03/28/2019   BILITOT 0.3 03/28/2019      Imaging Studies: MR SHOULDER RIGHT WO CONTRAST  Result Date: 03/02/2020 CLINICAL DATA:  Chronic right shoulder pain.  No known injury. EXAM: MRI OF THE RIGHT SHOULDER WITHOUT CONTRAST TECHNIQUE: Multiplanar, multisequence MR imaging of the shoulder was performed.  No intravenous contrast was administered. COMPARISON:  Plain films right shoulder 05/13/2019. FINDINGS: Rotator cuff: The patient has rotator cuff tendinopathy with a complete supraspinatus tendon tear. Retraction is just medial to the top of the humeral head, 2-3 cm. The rotator cuff is otherwise intact. Muscles:  No atrophy or focal lesion. Biceps long head:  Intact. Acromioclavicular Joint: Moderate to moderately severe osteoarthritis. Type 1 acromion. There is fluid in the subacromial/subdeltoid bursa and subacromial spurring. Glenohumeral Joint: Mild degenerative change is seen. Moderate glenohumeral joint effusion contains some debris. Labrum: Posterior labrum is degenerated and diminutive. No focal tear. Bones:  No fracture, stress change or worrisome lesion. Other: None. IMPRESSION: Rotator cuff tendinopathy with a complete supraspinatus tendon tear. Retraction is 2-3 cm. No atrophy. Moderate to moderately severe acromioclavicular osteoarthritis. Subacromial spurring also noted. Subacromial/subdeltoid fluid compatible with bursitis. Mild appearing glenohumeral osteoarthritis. Glenohumeral joint effusion contains small amount of debris. Electronically Signed   By: Inge Rise M.D.   On: 03/02/2020 11:49   Impression/plan:  69 year old female presenting to schedule first-ever colonoscopy.  Outside of occasional flare of hemorrhoids and constipation, generally doing well.  Plan for colonoscopy in the near future with Dr. Abbey Chatters.  ASA 2.  I have discussed the risks, alternatives, benefits with regards to but not limited to the risk of reaction to medication, bleeding, infection, perforation and the patient is agreeable to proceed. Written consent to be obtained.  GERD: Approaching 5 years of PPI therapy for reflux.  Would consider upper endoscopy at some point to screen for Barrett's esophagus.  Patient can return to the office to arrange upper endoscopy whenever she is ready to pursue.

## 2020-03-11 ENCOUNTER — Ambulatory Visit: Payer: Medicare HMO | Admitting: Family Medicine

## 2020-03-11 ENCOUNTER — Encounter: Payer: Self-pay | Admitting: Family Medicine

## 2020-03-11 ENCOUNTER — Other Ambulatory Visit: Payer: Self-pay

## 2020-03-11 ENCOUNTER — Ambulatory Visit: Payer: Self-pay

## 2020-03-11 VITALS — BP 100/82 | HR 73 | Ht 65.0 in | Wt 217.0 lb

## 2020-03-11 DIAGNOSIS — M75121 Complete rotator cuff tear or rupture of right shoulder, not specified as traumatic: Secondary | ICD-10-CM | POA: Diagnosis not present

## 2020-03-11 DIAGNOSIS — M25511 Pain in right shoulder: Secondary | ICD-10-CM

## 2020-03-11 DIAGNOSIS — G8929 Other chronic pain: Secondary | ICD-10-CM

## 2020-03-11 DIAGNOSIS — M75101 Unspecified rotator cuff tear or rupture of right shoulder, not specified as traumatic: Secondary | ICD-10-CM | POA: Insufficient documentation

## 2020-03-11 NOTE — Patient Instructions (Addendum)
Good to see you If need something to help with sleep give me a call a in couple weeks Write me if you have questions

## 2020-03-11 NOTE — Assessment & Plan Note (Addendum)
Right rotator cuff tear.  Patient does also have some moderate to severe arthritic changes of the acromioclavicular joint.  Patient does have 2 to 3 cm of retraction but no atrophy of the tendon or musculature.  I believe at this point patient would be a good candidate for surgery.  This is affecting daily activities as well as sleep.  Patient has had difficulty with multiple different medications that is makes it difficult for pain relief as well.  Patient will be referred for orthopedic surgery to discuss surgical intervention.  Patient did want the injection today though for some pain relief secondary to not being able to tolerate the different medications.

## 2020-03-11 NOTE — Progress Notes (Signed)
[facility] [address] [place] Phone: [phone] Subjective:   I Susan Oneill am serving as a Education administrator for Dr. Hulan Saas.  This visit occurred during the SARS-CoV-2 public health emergency.  Safety protocols were in place, including screening questions prior to the visit, additional usage of staff PPE, and extensive cleaning of exam room while observing appropriate contact time as indicated for disinfecting solutions.   I'm seeing this patient by the request  of:  Kathyrn Drown, MD  CC: Right shoulder pain follow-up  [facility]N:[id number]   01/21/2020 Injection given today.  Positive crossover and pain on the [facility] joint.  Discussed icing regimen and home exercises discussed potential physical therapy which patient will consider.  Did not do well with the gabapentin.  Hold on any other type of medication at the time.  Follow-up again in 4 to 8 weeks  Update 03/11/2020 Susan Oneill is a 69 y.o. female coming in with complaint of right AC joint pain. Patient states she feels the same. MRI recently. Wants another injection today. Pain at night.  Patient was unable to tolerate the gabapentin.  Patient continued to have pain and did have an MRI.  MRI did show a rotator cuff tear with 2 to 3 cm of retraction.  No atrophy of the tendon noted.  Patient does have acromioclavicular arthritis.  Patient states the injection of the acromioclavicular joint with no significant benefit.      Past Medical History:  Diagnosis Date  . Acid reflux   . Anxiety    xanax as needed  . Anxiousness 09/25/2019  . Arthritis    oa and pain both knees  . Atypical chest pain 5 / 2014   DISCHARGE SUMMARY [facility] - RULLED OUT FOR MI, NEGATIVE NUCLEAR STRESS TEST - PAIN ATTRIBUTED TO PT'S GERD- PT STATES NO FURTHER CHEST PAINS - Nolic HAS HELPED PAIN  . Cyst in hand    ?? CYST BACK OF LEFT HAND - PT STATES THE AREA POPPED UP - RAISED AREA - SOMETIMES PAINFUL - HAS A  BRACE TO WEAR ON THAT HAND WHEN SLEEPING  . Hemorrhoids   . HTN (hypertension)   . Hyperlipidemia   . Tinnitus   . Transfusion history 1978   after childbirth - not all of placenta passed and pt started bleeding  . Urticaria   . Uterine fibroid    no problems from the fibroids  . Varicose veins    Past Surgical History:  Procedure Laterality Date  . CARPAL TUNNEL RELEASE     bilateral  . DILATATION & CURETTAGE/HYSTEROSCOPY WITH MYOSURE N/A 09/11/2018   Procedure: DILATATION & CURETTAGE/HYSTEROSCOPY WITH MYOSURE;  Surgeon: Donnamae Jude, MD;  Location: [facility];  Service: Gynecology;  Laterality: N/A;  . HEMORROIDECTOMY    . HERNIA REPAIR  [id number]   umbilical hernia repair  . KNEE SURGERY     bilateral knee arthroscopy- rt knee sept 2011, left knee march 2010  . TOTAL KNEE ARTHROPLASTY Right 10/21/2013   Procedure: RIGHT TOTAL KNEE ARTHROPLASTY;  Surgeon: Gearlean Alf, MD;  Location: [facility];  Service: Orthopedics;  Laterality: Right;   Social History   Socioeconomic History  . Marital status: Married    Spouse name: Not on file  . Number of children: Not on file  . Years of education: 19  . Highest education level: Not on file  Occupational History  . Occupation: Psychologist, educational  Tobacco Use  . Smoking  status: Never Smoker  . Smokeless tobacco: Never Used  Substance and Sexual Activity  . Alcohol use: No  . Drug use: No  . Sexual activity: Yes  Other Topics Concern  . Not on file  Social History Narrative  . Not on file   Social Determinants of Health   Financial Resource Strain:   . Difficulty of Paying Living Expenses: Not on file  Food Insecurity:   . Worried About Charity fundraiser in the Last Year: Not on file  . Ran Out of Food in the Last Year: Not on file  Transportation Needs:   . Lack of Transportation (Medical): Not on file  . Lack of Transportation (Non-Medical): Not on file  Physical Activity:   . Days of Exercise per Week: Not on  file  . Minutes of Exercise per Session: Not on file  Stress:   . Feeling of Stress : Not on file  Social Connections:   . Frequency of Communication with Friends and Family: Not on file  . Frequency of Social Gatherings with Friends and Family: Not on file  . Attends Religious Services: Not on file  . Active Member of Clubs or Organizations: Not on file  . Attends Archivist Meetings: Not on file  . Marital Status: Not on file   Allergies  Allergen Reactions  . Amoxicillin Nausea And Vomiting and Rash  . Augmentin [Amoxicillin-Pot Clavulanate] Nausea And Vomiting       . Ciprofloxacin Nausea And Vomiting  . Codeine Nausea And Vomiting and Rash  . Latex Rash    Pt states some tape ok  . Mobic [Meloxicam] Other (See Comments)    Constipation  . Sulfonamide Derivatives Nausea And Vomiting and Rash   Family History  Problem Relation Age of Onset  . Arthritis Other   . Heart disease Other   . Heart disease Father   . High blood pressure Father   . Colon cancer Paternal Uncle      Current Outpatient Medications (Cardiovascular):  .  hydrochlorothiazide (HYDRODIURIL) 25 MG tablet, Take 1 tablet (25 mg total) by mouth daily. Marland Kitchen  lisinopril (ZESTRIL) 40 MG tablet, TAKE 1 TABLET EVERY DAY   Current Outpatient Medications (Analgesics):  .  aspirin 81 MG chewable tablet, Chew 81 mg by mouth daily. .  naproxen (NAPROSYN) 500 MG tablet, Take one tablet twice daily with a meal prn pain .  traMADol (ULTRAM) 50 MG tablet, Take 1 tablet (50 mg total) by mouth 4 (four) times daily.   Current Outpatient Medications (Other):  Marland Kitchen  ALPRAZolam (XANAX) 0.5 MG tablet, TAKE 1/2- TO 1 TABLET BY MOUTH TWO TIMES A DAY AS NEEDED (Patient taking differently: as needed. TAKE 1/2- TO 1 TABLET BY MOUTH TWO TIMES A DAY AS NEEDED) .  esomeprazole (NEXIUM) 20 MG capsule, Take 20 mg by mouth daily at 12 noon. .  mometasone (ELOCON) 0.1 % cream, APPLY TO AFFECTED AREA DAILY (Patient taking  differently: as needed. Apply to affected area daily)   Reviewed prior external information including notes and imaging from  primary care provider As well as notes that were available from care everywhere and other healthcare systems.  Past medical history, social, surgical and family history all reviewed in electronic medical record.  No pertanent information unless stated regarding to the chief complaint.   Review of Systems:  No headache, visual changes, nausea, vomiting, diarrhea, constipation, dizziness, abdominal pain, skin rash, fevers, chills, night sweats, weight loss, swollen lymph nodes, body  aches, joint swelling, chest pain, shortness of breath, mood changes. POSITIVE muscle aches  Objective  Blood pressure 100/82, pulse 73, height 5\' 5"  (1.651 m), weight 217 lb (98.4 kg), SpO2 94 %.   General: No apparent distress alert and oriented x3 mood and affect normal, dressed appropriately.  HEENT: Pupils equal, extraocular movements intact  Respiratory: Patient's speak in full sentences and does not appear short of breath  Cardiovascular: No lower extremity edema, non tender, no erythema  Neuro: Cranial nerves II through XII are intact, neurovascularly intact in all extremities with 2+ DTRs and 2+ pulses.  Gait normal with good balance and coordination.  MSK: Right shoulder exam shows the patient does have positive impingement with Neer and Hawkins, positive crossover with tenderness over the acromioclavicular joint, 4+ out of 5 strength of the rotator cuff which is today.  Patient does have some mild scapular dyskinesis noted.  No atrophy of the musculature  Procedure: Real-time Ultrasound Guided Injection of right glenohumeral joint Device: GE Logiq Q7  Ultrasound guided injection is preferred based studies that show increased duration, increased effect, greater accuracy, decreased procedural pain, increased response rate with ultrasound guided versus blind injection.  Verbal  informed consent obtained.  Time-out conducted.  Noted no overlying erythema, induration, or other signs of local infection.  Skin prepped in a sterile fashion.  Local anesthesia: Topical Ethyl chloride.  With sterile technique and under real time ultrasound guidance:  Joint visualized.  23g 1  inch needle inserted posterior approach. Pictures taken for needle placement. Patient did have injection of 2 cc of 1% lidocaine, 2 cc of 0.5% Marcaine, and 1.0 cc of Kenalog 40 mg/dL. Completed without difficulty  Pain immediately resolved suggesting accurate placement of the medication.  Advised to call if fevers/chills, erythema, induration, drainage, or persistent bleeding.  Images permanently stored and available for review in the ultrasound unit.  Impression: Technically successful ultrasound guided injection.    Impression and Recommendations:     The above documentation has been reviewed and is accurate and complete Lyndal Pulley, DO       Note: This dictation was prepared with Dragon dictation along with smaller phrase technology. Any transcriptional errors that result from this process are unintentional.

## 2020-03-18 ENCOUNTER — Other Ambulatory Visit: Payer: Self-pay | Admitting: Family Medicine

## 2020-03-18 NOTE — Telephone Encounter (Signed)
Made appt for 10/08 for Med Follow Up

## 2020-03-20 ENCOUNTER — Other Ambulatory Visit (HOSPITAL_COMMUNITY)
Admission: RE | Admit: 2020-03-20 | Discharge: 2020-03-20 | Disposition: A | Discharge status: Home / Self Care | Payer: Medicare HMO | Source: Ambulatory Visit | Attending: Internal Medicine | Admitting: Internal Medicine

## 2020-03-20 ENCOUNTER — Other Ambulatory Visit: Payer: Self-pay

## 2020-03-20 DIAGNOSIS — Z01812 Encounter for preprocedural laboratory examination: Secondary | ICD-10-CM | POA: Diagnosis not present

## 2020-03-20 DIAGNOSIS — Z20822 Contact with and (suspected) exposure to covid-19: Secondary | ICD-10-CM | POA: Insufficient documentation

## 2020-03-20 LAB — BASIC METABOLIC PANEL
Anion gap: 7 (ref 5–15)
BUN: 24 mg/dL — ABNORMAL HIGH (ref 8–23)
CO2: 26 mmol/L (ref 22–32)
Calcium: 9 mg/dL (ref 8.9–10.3)
Chloride: 105 mmol/L (ref 98–111)
Creatinine, Ser: 0.83 mg/dL (ref 0.44–1.00)
GFR calc Af Amer: >60 mL/min (ref >60)
GFR calc non Af Amer: >60 mL/min (ref >60)
Glucose, Bld: 89 mg/dL (ref 70–99)
Potassium: 4.1 mmol/L (ref 3.5–5.1)
Sodium: 138 mmol/L (ref 135–145)

## 2020-03-20 LAB — SARS CORONAVIRUS 2 (TAT 6-24 HRS): SARS Coronavirus 2: NEGATIVE

## 2020-03-23 ENCOUNTER — Other Ambulatory Visit: Payer: Self-pay

## 2020-03-23 ENCOUNTER — Encounter (HOSPITAL_COMMUNITY): Payer: Self-pay

## 2020-03-23 ENCOUNTER — Ambulatory Visit (HOSPITAL_COMMUNITY)
Admission: RE | Admit: 2020-03-23 | Discharge: 2020-03-23 | Disposition: A | Discharge status: Home / Self Care | Payer: Medicare HMO | Attending: Internal Medicine | Admitting: Internal Medicine

## 2020-03-23 ENCOUNTER — Ambulatory Visit (HOSPITAL_COMMUNITY): Payer: Medicare HMO | Admitting: Anesthesiology

## 2020-03-23 ENCOUNTER — Encounter (HOSPITAL_COMMUNITY)
Admission: RE | Disposition: A | Discharge status: Home / Self Care | Payer: Self-pay | Source: Home / Self Care | Attending: Internal Medicine

## 2020-03-23 DIAGNOSIS — F419 Anxiety disorder, unspecified: Secondary | ICD-10-CM | POA: Diagnosis not present

## 2020-03-23 DIAGNOSIS — K219 Gastro-esophageal reflux disease without esophagitis: Secondary | ICD-10-CM | POA: Insufficient documentation

## 2020-03-23 DIAGNOSIS — Z1211 Encounter for screening for malignant neoplasm of colon: Secondary | ICD-10-CM | POA: Diagnosis not present

## 2020-03-23 DIAGNOSIS — Z800 Family history of malignant neoplasm of digestive organs: Secondary | ICD-10-CM | POA: Insufficient documentation

## 2020-03-23 DIAGNOSIS — K5730 Diverticulosis of large intestine without perforation or abscess without bleeding: Secondary | ICD-10-CM | POA: Insufficient documentation

## 2020-03-23 DIAGNOSIS — K648 Other hemorrhoids: Secondary | ICD-10-CM | POA: Diagnosis not present

## 2020-03-23 DIAGNOSIS — Z79899 Other long term (current) drug therapy: Secondary | ICD-10-CM | POA: Diagnosis not present

## 2020-03-23 DIAGNOSIS — I10 Essential (primary) hypertension: Secondary | ICD-10-CM | POA: Diagnosis not present

## 2020-03-23 DIAGNOSIS — Z7982 Long term (current) use of aspirin: Secondary | ICD-10-CM | POA: Insufficient documentation

## 2020-03-23 DIAGNOSIS — Z8249 Family history of ischemic heart disease and other diseases of the circulatory system: Secondary | ICD-10-CM | POA: Insufficient documentation

## 2020-03-23 HISTORY — PX: COLONOSCOPY WITH PROPOFOL: SHX5780

## 2020-03-23 SURGERY — COLONOSCOPY WITH PROPOFOL
Anesthesia: General

## 2020-03-23 MED ORDER — PROPOFOL 10 MG/ML IV BOLUS
INTRAVENOUS | Status: DC | PRN
  Administered 2020-03-23: 50 mg via INTRAVENOUS
  Administered 2020-03-23: 30 mg via INTRAVENOUS
  Administered 2020-03-23: 70 mg via INTRAVENOUS
  Administered 2020-03-23: 100 mg via INTRAVENOUS
  Administered 2020-03-23: 50 mg via INTRAVENOUS

## 2020-03-23 MED ORDER — LACTATED RINGERS IV SOLN: INTRAVENOUS | Status: DC | PRN

## 2020-03-23 MED ORDER — LIDOCAINE 2% (20 MG/ML) 5 ML SYRINGE
INTRAMUSCULAR | Status: DC | PRN
  Administered 2020-03-23: 60 mg via INTRAVENOUS
  Administered 2020-03-23: 40 mg via INTRAVENOUS

## 2020-03-23 MED ORDER — CHLORHEXIDINE GLUCONATE CLOTH 2 % EX PADS: 6.0000 | MEDICATED_PAD | Freq: Once | CUTANEOUS | Status: DC

## 2020-03-23 MED ORDER — LACTATED RINGERS IV SOLN: Freq: Once | INTRAVENOUS | Status: AC

## 2020-03-23 MED ORDER — STERILE WATER FOR IRRIGATION IR SOLN
Status: DC | PRN
  Administered 2020-03-23: 1.5 mL

## 2020-03-23 NOTE — Anesthesia Preprocedure Evaluation (Addendum)
Anesthesia Evaluation  Patient identified by MRN, date of birth, ID band Patient awake    Reviewed: Allergy & Precautions, NPO status , Patient's Chart, lab work & pertinent test results  History of Anesthesia Complications Negative for: history of anesthetic complications  Airway Mallampati: III  TM Distance: >3 FB Neck ROM: Full    Dental  (+) Dental Advisory Given   Pulmonary neg pulmonary ROS,    Pulmonary exam normal breath sounds clear to auscultation       Cardiovascular Exercise Tolerance: Good METS: 3 - Mets hypertension, Pt. on medications Normal cardiovascular exam Rhythm:Regular Rate:Normal     Neuro/Psych Anxiety negative neurological ROS     GI/Hepatic Neg liver ROS, GERD  Medicated and Controlled,  Endo/Other  negative endocrine ROS  Renal/GU negative Renal ROS  negative genitourinary   Musculoskeletal  (+) Arthritis ,   Abdominal   Peds negative pediatric ROS (+)  Hematology negative hematology ROS (+)   Anesthesia Other Findings   Reproductive/Obstetrics negative OB ROS                            Anesthesia Physical Anesthesia Plan  ASA: II  Anesthesia Plan: General   Post-op Pain Management:    Induction: Intravenous  PONV Risk Score and Plan: TIVA  Airway Management Planned: Nasal Cannula and Natural Airway  Additional Equipment:   Intra-op Plan:   Post-operative Plan:   Informed Consent: I have reviewed the patients History and Physical, chart, labs and discussed the procedure including the risks, benefits and alternatives for the proposed anesthesia with the patient or authorized representative who has indicated his/her understanding and acceptance.     Dental advisory given  Plan Discussed with: CRNA and Surgeon  Anesthesia Plan Comments:         Anesthesia Quick Evaluation

## 2020-03-23 NOTE — Op Note (Signed)
[facility] Patient Name: Susan Oneill Procedure Date: 03/23/2020 12:25 PM MRN: [id number] Date of Birth: 1951/07/03 Attending MD: Elon Alas. Abbey Chatters DO CSN: [id number] Age: 69 Admit Type: Outpatient Procedure:                Colonoscopy Indications:              Screening for colorectal malignant neoplasm Providers:                Elon Alas. Abbey Chatters, DO, Caprice Kluver, Crystal Page,                            Casimer Bilis, Technician, Aram Candela Referring MD:              Medicines:                See the Anesthesia note for documentation of the                            administered medications Complications:            No immediate complications. Estimated Blood Loss:     Estimated blood loss: none. Procedure:                Pre-Anesthesia Assessment:                           - The anesthesia plan was to use monitored                            anesthesia care (MAC).                           After obtaining informed consent, the colonoscope                            was passed under direct vision. Throughout the                            procedure, the patient's blood pressure, pulse, and                            oxygen saturations were monitored continuously. The                            [id number]-[id number]) was introduced through the anus                            and advanced to the the cecum, identified by                            appendiceal orifice and ileocecal valve. The                            colonoscopy was performed without difficulty. The                            patient tolerated the procedure well.  The quality                            of the bowel preparation was evaluated using the                            BBPS [facility] Bowel Preparation Scale) with scores                            of: Right Colon = 2 (minor amount of residual                            staining, small fragments of stool and/or opaque                             liquid, but mucosa seen well), Transverse Colon = 3                            (entire mucosa seen well with no residual staining,                            small fragments of stool or opaque liquid) and Left                            Colon = 3 (entire mucosa seen well with no residual                            staining, small fragments of stool or opaque                            liquid). The total BBPS score equals 8. The quality                            of the bowel preparation was good. Scope In: 12:31:58 PM Scope Out: 12:47:01 PM Scope Withdrawal Time: 0 hours 7 minutes 45 seconds  Total Procedure Duration: 0 hours 15 minutes 3 seconds  Findings:      The perianal and digital rectal examinations were normal.      Non-bleeding internal hemorrhoids were found during retroflexion.      Multiple small-mouthed diverticula were found in the sigmoid colon.      The exam was otherwise without abnormality. Impression:               - Non-bleeding internal hemorrhoids.                           - Diverticulosis in the sigmoid colon.                           - The examination was otherwise normal.                           - No specimens collected. Moderate Sedation:      Per Anesthesia Care Recommendation:           -  Patient has a contact number available for                            emergencies. The signs and symptoms of potential                            delayed complications were discussed with the                            patient. Return to normal activities tomorrow.                            Written discharge instructions were provided to the                            patient.                           - Resume previous diet.                           - Continue present medications.                           - Repeat colonoscopy in 10 years for screening                            purposes.                           - Return to GI clinic PRN. Procedure Code(s):         --- Professional ---                           P5465, Colorectal cancer screening; colonoscopy on                            individual not meeting criteria for high risk Diagnosis Code(s):        --- Professional ---                           Z12.11, Encounter for screening for malignant                            neoplasm of colon                           K64.8, Other hemorrhoids                           K57.30, Diverticulosis of large intestine without                            perforation or abscess without bleeding CPT copyright 2019 American Medical Association. All rights reserved. The codes documented in this report are preliminary and upon coder review may  be revised to meet current compliance requirements. Juanda Crumble  Arlee Muslim, MD Elon Alas. Abbey Chatters, DO 03/23/2020 12:53:26 PM This report has been signed electronically. Number of Addenda: 0

## 2020-03-23 NOTE — Discharge Instructions (Addendum)
Colonoscopy Discharge Instructions  Read the instructions outlined below and refer to this sheet in the next few weeks.  These discharge instructions provide you with general information on caring for yourself after you leave the hospital. Your doctor may also give you specific instructions. While your treatment has been planned according to the most current medical practices available, unavoidable complications occasionally occur.   ACTIVITY  You may resume your regular activity, but move at a slower pace for the next 24 hours.   Take frequent rest periods for the next 24 hours.   Walking will help get rid of the air and reduce the bloated feeling in your belly (abdomen).   No driving for 24 hours (because of the medicine (anesthesia) used during the test).    Do not sign any important legal documents or operate any machinery for 24 hours (because of the anesthesia used during the test).  NUTRITION  Drink plenty of fluids.   You may resume your normal diet as instructed by your doctor.   Begin with a light meal and progress to your normal diet. Heavy or fried foods are harder to digest and may make you feel sick to your stomach (nauseated).   Avoid alcoholic beverages for 24 hours or as instructed.  MEDICATIONS  You may resume your normal medications unless your doctor tells you otherwise.  WHAT YOU CAN EXPECT TODAY  Some feelings of bloating in the abdomen.   Passage of more gas than usual.   Spotting of blood in your stool or on the toilet paper.  IF YOU HAD POLYPS REMOVED DURING THE COLONOSCOPY:  No aspirin products for 7 days or as instructed.   No alcohol for 7 days or as instructed.   Eat a soft diet for the next 24 hours.  FINDING OUT THE RESULTS OF YOUR TEST Not all test results are available during your visit. If your test results are not back during the visit, make an appointment with your caregiver to find out the results. Do not assume everything is normal  if you have not heard from your caregiver or the medical facility. It is important for you to follow up on all of your test results.  SEEK IMMEDIATE MEDICAL ATTENTION IF:  You have more than a spotting of blood in your stool.   Your belly is swollen (abdominal distention).   You are nauseated or vomiting.   You have a temperature over 101.   You have abdominal pain or discomfort that is severe or gets worse throughout the day.   Your colonoscopy was relatively unremarkable.  I did not find any polyps throughout your entire colon.  You did have internal hemorrhoids.  Recommend repeating colonoscopy in 10 years for screening purposes.  If you have further issues with rectal bleeding, please call our office to set up hemorrhoid banding with Roseanne Kaufman.  Otherwise follow-up with GI as needed.  I hope you have a great rest of your week!  Elon Alas. Abbey Chatters, D.O. Gastroenterology and Hepatology [facility]   Hemorrhoids Hemorrhoids are swollen veins in and around the rectum or anus. There are two types of hemorrhoids:  Internal hemorrhoids. These occur in the veins that are just inside the rectum. They may poke through to the outside and become irritated and painful.  External hemorrhoids. These occur in the veins that are outside the anus and can be felt as a painful swelling or hard lump near the anus. Most hemorrhoids do not cause serious problems,  and they can be managed with home treatments such as diet and lifestyle changes. If home treatments do not help the symptoms, procedures can be done to shrink or remove the hemorrhoids. What are the causes? This condition is caused by increased pressure in the anal area. This pressure may result from various things, including:  Constipation.  Straining to have a bowel movement.  Diarrhea.  Pregnancy.  Obesity.  Sitting for long periods of time.  Heavy lifting or other activity that causes you to  strain.  Anal sex.  Riding a bike for a long period of time. What are the signs or symptoms? Symptoms of this condition include:  Pain.  Anal itching or irritation.  Rectal bleeding.  Leakage of stool (feces).  Anal swelling.  One or more lumps around the anus. How is this diagnosed? This condition can often be diagnosed through a visual exam. Other exams or tests may also be done, such as:  An exam that involves feeling the rectal area with a gloved hand (digital rectal exam).  An exam of the anal canal that is done using a small tube (anoscope).  A blood test, if you have lost a significant amount of blood.  A test to look inside the colon using a flexible tube with a camera on the end (sigmoidoscopy or colonoscopy). How is this treated? This condition can usually be treated at home. However, various procedures may be done if dietary changes, lifestyle changes, and other home treatments do not help your symptoms. These procedures can help make the hemorrhoids smaller or remove them completely. Some of these procedures involve surgery, and others do not. Common procedures include:  Rubber band ligation. Rubber bands are placed at the base of the hemorrhoids to cut off their blood supply.  Sclerotherapy. Medicine is injected into the hemorrhoids to shrink them.  Infrared coagulation. A type of light energy is used to get rid of the hemorrhoids.  Hemorrhoidectomy surgery. The hemorrhoids are surgically removed, and the veins that supply them are tied off.  Stapled hemorrhoidopexy surgery. The surgeon staples the base of the hemorrhoid to the rectal wall. Follow these instructions at home: Eating and drinking   Eat foods that have a lot of fiber in them, such as whole grains, beans, nuts, fruits, and vegetables.  Ask your health care provider about taking products that have added fiber (fiber supplements).  Reduce the amount of fat in your diet. You can do this by  eating low-fat dairy products, eating less red meat, and avoiding processed foods.  Drink enough fluid to keep your urine pale yellow. Managing pain and swelling   Take warm sitz baths for 20 minutes, 3-4 times a day to ease pain and discomfort. You may do this in a bathtub or using a portable sitz bath that fits over the toilet.  If directed, apply ice to the affected area. Using ice packs between sitz baths may be helpful. ? Put ice in a plastic bag. ? Place a towel between your skin and the bag. ? Leave the ice on for 20 minutes, 2-3 times a day. General instructions  Take over-the-counter and prescription medicines only as told by your health care provider.  Use medicated creams or suppositories as told.  Get regular exercise. Ask your health care provider how much and what kind of exercise is best for you. In general, you should do moderate exercise for at least 30 minutes on most days of the week (150 minutes each week). This  can include activities such as walking, biking, or yoga.  Go to the bathroom when you have the urge to have a bowel movement. Do not wait.  Avoid straining to have bowel movements.  Keep the anal area dry and clean. Use wet toilet paper or moist towelettes after a bowel movement.  Do not sit on the toilet for long periods of time. This increases blood pooling and pain.  Keep all follow-up visits as told by your health care provider. This is important. Contact a health care provider if you have:  Increasing pain and swelling that are not controlled by treatment or medicine.  Difficulty having a bowel movement, or you are unable to have a bowel movement.  Pain or inflammation outside the area of the hemorrhoids. Get help right away if you have:  Uncontrolled bleeding from your rectum. Summary  Hemorrhoids are swollen veins in and around the rectum or anus.  Most hemorrhoids can be managed with home treatments such as diet and lifestyle  changes.  Taking warm sitz baths can help ease pain and discomfort.  In severe cases, procedures or surgery can be done to shrink or remove the hemorrhoids. This information is not intended to replace advice given to you by your health care provider. Make sure you discuss any questions you have with your health care provider. Document Revised: 11/16/2018 Document Reviewed: 11/09/2017 Elsevier Patient Education  [place].

## 2020-03-23 NOTE — Transfer of Care (Signed)
Immediate Anesthesia Transfer of Care Note  Patient: Susan Oneill  Procedure(s) Performed: COLONOSCOPY WITH PROPOFOL (N/A )  Patient Location: Endoscopy Unit  Anesthesia Type:General  Level of Consciousness: awake and patient cooperative  Airway & Oxygen Therapy: Patient Spontanous Breathing  Post-op Assessment: Report given to RN and Post -op Vital signs reviewed and stable  Post vital signs: Reviewed and stable  Last Vitals:  Vitals Value Taken Time  BP 120/67 03/23/20 1249  Temp 36.6 C 03/23/20 1249  Pulse 78 03/23/20 1249  Resp 20 03/23/20 1249  SpO2 99 % 03/23/20 1249    Last Pain:  Vitals:   03/23/20 1249  TempSrc: Oral  PainSc: 0-No pain         Complications: No complications documented.

## 2020-03-23 NOTE — Anesthesia Postprocedure Evaluation (Signed)
Anesthesia Post Note  Patient: Susan B Deloatch  Procedure(s) Performed: COLONOSCOPY WITH PROPOFOL (N/A )  Patient location during evaluation: Endoscopy Anesthesia Type: General Level of consciousness: awake Pain management: pain level controlled Vital Signs Assessment: post-procedure vital signs reviewed and stable Respiratory status: spontaneous breathing Cardiovascular status: blood pressure returned to baseline Postop Assessment: no headache and no apparent nausea or vomiting Anesthetic complications: no   No complications documented.   Last Vitals:  Vitals:   03/23/20 1213 03/23/20 1249  BP: (!) 165/83 120/67  Pulse: 87 78  Resp: 18 20  Temp:  36.6 C  SpO2: 96% 99%    Last Pain:  Vitals:   03/23/20 1249  TempSrc: Oral  PainSc: 0-No pain                 Orlie Dakin

## 2020-03-23 NOTE — Interval H&P Note (Signed)
History and Physical Interval Note:  03/23/2020 12:17 PM  Susan Oneill  has presented today for surgery, with the diagnosis of screening colonoscopy.  The various methods of treatment have been discussed with the patient and family. After consideration of risks, benefits and other options for treatment, the patient has consented to  Procedure(s) with comments: COLONOSCOPY WITH PROPOFOL (N/A) - 1:30pm as a surgical intervention.  The patient's history has been reviewed, patient examined, no change in status, stable for surgery.  I have reviewed the patient's chart and labs.  Questions were answered to the patient's satisfaction.     Eloise Harman

## 2020-03-26 ENCOUNTER — Encounter (HOSPITAL_COMMUNITY): Payer: Self-pay | Admitting: Internal Medicine

## 2020-03-31 ENCOUNTER — Other Ambulatory Visit: Payer: Self-pay | Admitting: Family Medicine

## 2020-04-01 DIAGNOSIS — M75121 Complete rotator cuff tear or rupture of right shoulder, not specified as traumatic: Secondary | ICD-10-CM | POA: Diagnosis not present

## 2020-04-03 ENCOUNTER — Other Ambulatory Visit: Payer: Self-pay | Admitting: Family Medicine

## 2020-04-06 NOTE — Telephone Encounter (Signed)
09/25/19 was last med check up. Has upcoming app this week

## 2020-04-10 ENCOUNTER — Ambulatory Visit (INDEPENDENT_AMBULATORY_CARE_PROVIDER_SITE_OTHER): Payer: Medicare HMO | Admitting: Family Medicine

## 2020-04-10 ENCOUNTER — Other Ambulatory Visit: Payer: Self-pay

## 2020-04-10 ENCOUNTER — Encounter: Payer: Self-pay | Admitting: Family Medicine

## 2020-04-10 VITALS — BP 122/78 | Temp 97.4°F | Ht 65.0 in | Wt 216.6 lb

## 2020-04-10 DIAGNOSIS — E785 Hyperlipidemia, unspecified: Secondary | ICD-10-CM | POA: Diagnosis not present

## 2020-04-10 DIAGNOSIS — D692 Other nonthrombocytopenic purpura: Secondary | ICD-10-CM

## 2020-04-10 DIAGNOSIS — Z23 Encounter for immunization: Secondary | ICD-10-CM

## 2020-04-10 DIAGNOSIS — M170 Bilateral primary osteoarthritis of knee: Secondary | ICD-10-CM

## 2020-04-10 DIAGNOSIS — I10 Essential (primary) hypertension: Secondary | ICD-10-CM | POA: Diagnosis not present

## 2020-04-10 NOTE — Progress Notes (Signed)
   Subjective:    Patient ID: Susan Oneill, female    DOB: 07-31-1950, 69 y.o.   MRN: [id number]  Hypertension This is a chronic problem. The current episode started more than 1 year ago. Pertinent negatives include no chest pain or shortness of breath. Risk factors for coronary artery disease include post-menopausal state. Treatments tried: lisinopril, hctz. There are no compliance problems.    Essential hypertension - Plan: Hepatic function panel  Hyperlipidemia, unspecified hyperlipidemia type - Plan: Lipid panel  Osteoarthritis of both knees, unspecified osteoarthritis type  Need for vaccination - Plan: Flu Vaccine QUAD High Dose(Fluad), Pneumococcal polysaccharide vaccine 23-valent greater than or equal to 2yo subcutaneous/IM  Purpura senilis ([place])  Patient with some bruising on her arms could be related to the 81 mg aspirin Patient was counseled to quit aspirin Osteoarthritis in her hands shoulders and knees worse in the left knee Uses anti-inflammatory twice a day as needed Occasionally uses Tylenol Uses tramadol no more than 4 times a day    Review of Systems  Constitutional: Negative for activity change, appetite change and fatigue.  HENT: Negative for congestion and rhinorrhea.   Respiratory: Negative for cough and shortness of breath.   Cardiovascular: Negative for chest pain and leg swelling.  Gastrointestinal: Negative for abdominal pain and diarrhea.  Endocrine: Negative for polydipsia and polyphagia.  Skin: Negative for color change.  Neurological: Negative for dizziness and weakness.  Psychiatric/Behavioral: Negative for behavioral problems and confusion.       Objective:   Physical Exam  Lungs clear heart regular HEENT benign  Patient up-to-date on colonoscopy    Assessment & Plan:  1. Essential hypertension Blood pressure good control continue current measures - Hepatic function panel  2. Hyperlipidemia, unspecified hyperlipidemia  type Continue current medication check lipid and liver - Lipid panel  3. Osteoarthritis of both knees, unspecified osteoarthritis type Tylenol tramadol when necessary minimize Naprosyn to no more than twice a day  4. Need for vaccination Flu pneumonia shot - Flu Vaccine QUAD High Dose(Fluad) - Pneumococcal polysaccharide vaccine 23-valent greater than or equal to 2yo subcutaneous/IM  5. Purpura senilis ([facility]) Stop 81 mg aspirin risk outweigh benefits Patient has underlying anxiety issues she was counseled to try to minimize her use of Xanax. Follow-up in 6 months sooner problems

## 2020-04-28 DIAGNOSIS — G8918 Other acute postprocedural pain: Secondary | ICD-10-CM | POA: Diagnosis not present

## 2020-04-28 DIAGNOSIS — M75121 Complete rotator cuff tear or rupture of right shoulder, not specified as traumatic: Secondary | ICD-10-CM | POA: Diagnosis not present

## 2020-04-28 DIAGNOSIS — M24111 Other articular cartilage disorders, right shoulder: Secondary | ICD-10-CM | POA: Diagnosis not present

## 2020-04-28 DIAGNOSIS — M7541 Impingement syndrome of right shoulder: Secondary | ICD-10-CM | POA: Diagnosis not present

## 2020-05-12 DIAGNOSIS — M75121 Complete rotator cuff tear or rupture of right shoulder, not specified as traumatic: Secondary | ICD-10-CM | POA: Diagnosis not present

## 2020-05-20 DIAGNOSIS — M75121 Complete rotator cuff tear or rupture of right shoulder, not specified as traumatic: Secondary | ICD-10-CM | POA: Diagnosis not present

## 2020-05-22 DIAGNOSIS — M75121 Complete rotator cuff tear or rupture of right shoulder, not specified as traumatic: Secondary | ICD-10-CM | POA: Diagnosis not present

## 2020-05-25 DIAGNOSIS — M75121 Complete rotator cuff tear or rupture of right shoulder, not specified as traumatic: Secondary | ICD-10-CM | POA: Diagnosis not present

## 2020-05-27 DIAGNOSIS — M75121 Complete rotator cuff tear or rupture of right shoulder, not specified as traumatic: Secondary | ICD-10-CM | POA: Diagnosis not present

## 2020-05-29 ENCOUNTER — Other Ambulatory Visit: Payer: Self-pay | Admitting: Family Medicine

## 2020-06-01 DIAGNOSIS — M75121 Complete rotator cuff tear or rupture of right shoulder, not specified as traumatic: Secondary | ICD-10-CM | POA: Diagnosis not present

## 2020-06-05 DIAGNOSIS — M75121 Complete rotator cuff tear or rupture of right shoulder, not specified as traumatic: Secondary | ICD-10-CM | POA: Diagnosis not present

## 2020-06-09 DIAGNOSIS — M75121 Complete rotator cuff tear or rupture of right shoulder, not specified as traumatic: Secondary | ICD-10-CM | POA: Diagnosis not present

## 2020-06-16 DIAGNOSIS — M75121 Complete rotator cuff tear or rupture of right shoulder, not specified as traumatic: Secondary | ICD-10-CM | POA: Diagnosis not present

## 2020-06-18 DIAGNOSIS — M75121 Complete rotator cuff tear or rupture of right shoulder, not specified as traumatic: Secondary | ICD-10-CM | POA: Diagnosis not present

## 2020-06-23 DIAGNOSIS — M75121 Complete rotator cuff tear or rupture of right shoulder, not specified as traumatic: Secondary | ICD-10-CM | POA: Diagnosis not present

## 2020-06-25 DIAGNOSIS — M75121 Complete rotator cuff tear or rupture of right shoulder, not specified as traumatic: Secondary | ICD-10-CM | POA: Diagnosis not present

## 2020-06-30 DIAGNOSIS — M75121 Complete rotator cuff tear or rupture of right shoulder, not specified as traumatic: Secondary | ICD-10-CM | POA: Diagnosis not present

## 2020-07-02 DIAGNOSIS — M75121 Complete rotator cuff tear or rupture of right shoulder, not specified as traumatic: Secondary | ICD-10-CM | POA: Diagnosis not present

## 2020-07-09 DIAGNOSIS — M75121 Complete rotator cuff tear or rupture of right shoulder, not specified as traumatic: Secondary | ICD-10-CM | POA: Diagnosis not present

## 2020-07-13 DIAGNOSIS — M75121 Complete rotator cuff tear or rupture of right shoulder, not specified as traumatic: Secondary | ICD-10-CM | POA: Diagnosis not present

## 2020-07-16 DIAGNOSIS — M75121 Complete rotator cuff tear or rupture of right shoulder, not specified as traumatic: Secondary | ICD-10-CM | POA: Diagnosis not present

## 2020-07-23 ENCOUNTER — Other Ambulatory Visit: Payer: Self-pay | Admitting: Family Medicine

## 2020-07-23 DIAGNOSIS — M75121 Complete rotator cuff tear or rupture of right shoulder, not specified as traumatic: Secondary | ICD-10-CM | POA: Diagnosis not present

## 2020-07-28 ENCOUNTER — Other Ambulatory Visit: Payer: Self-pay | Admitting: Family Medicine

## 2020-07-28 DIAGNOSIS — M75121 Complete rotator cuff tear or rupture of right shoulder, not specified as traumatic: Secondary | ICD-10-CM | POA: Diagnosis not present

## 2020-07-30 DIAGNOSIS — M75121 Complete rotator cuff tear or rupture of right shoulder, not specified as traumatic: Secondary | ICD-10-CM | POA: Diagnosis not present

## 2020-08-04 DIAGNOSIS — M75121 Complete rotator cuff tear or rupture of right shoulder, not specified as traumatic: Secondary | ICD-10-CM | POA: Diagnosis not present

## 2020-08-06 DIAGNOSIS — M75121 Complete rotator cuff tear or rupture of right shoulder, not specified as traumatic: Secondary | ICD-10-CM | POA: Diagnosis not present

## 2020-08-31 DIAGNOSIS — Z4789 Encounter for other orthopedic aftercare: Secondary | ICD-10-CM | POA: Diagnosis not present

## 2020-08-31 DIAGNOSIS — M24811 Other specific joint derangements of right shoulder, not elsewhere classified: Secondary | ICD-10-CM | POA: Diagnosis not present

## 2020-09-07 ENCOUNTER — Other Ambulatory Visit: Payer: Self-pay | Admitting: Family Medicine

## 2020-09-10 ENCOUNTER — Other Ambulatory Visit: Payer: Self-pay | Admitting: Family Medicine

## 2020-09-10 ENCOUNTER — Telehealth: Payer: Self-pay

## 2020-09-10 NOTE — Telephone Encounter (Signed)
Please advise. Pt last seen in March 2021; has upcoming appt 10/08/20. Thank you

## 2020-09-10 NOTE — Telephone Encounter (Signed)
Needs refill on ALPRAZolam Susan Oneill) 0.5 MG tablet Susan Oneill, [address] [place], [place] [address]   Pt call back [phone]

## 2020-09-11 NOTE — Telephone Encounter (Signed)
Prescription was refilled through refills thank you

## 2020-09-29 DIAGNOSIS — I10 Essential (primary) hypertension: Secondary | ICD-10-CM | POA: Diagnosis not present

## 2020-09-29 DIAGNOSIS — E785 Hyperlipidemia, unspecified: Secondary | ICD-10-CM | POA: Diagnosis not present

## 2020-09-30 LAB — LIPID PANEL
Chol/HDL Ratio: 3.8 ratio (ref 0.0–4.4)
Cholesterol, Total: 220 mg/dL — ABNORMAL HIGH (ref 100–199)
HDL: 58 mg/dL (ref >39)
LDL Chol Calc (NIH): 132 mg/dL — ABNORMAL HIGH (ref 0–99)
Triglycerides: 168 mg/dL — ABNORMAL HIGH (ref 0–149)
VLDL Cholesterol Cal: 30 mg/dL (ref 5–40)

## 2020-09-30 LAB — HEPATIC FUNCTION PANEL
ALT: 13 IU/L (ref 0–32)
AST: 14 IU/L (ref 0–40)
Albumin: 4.4 g/dL (ref 3.8–4.8)
Alkaline Phosphatase: 80 IU/L (ref 44–121)
Bilirubin Total: 0.3 mg/dL (ref 0.0–1.2)
Bilirubin, Direct: <0.1 mg/dL (ref 0.00–0.40)
Total Protein: 6.9 g/dL (ref 6.0–8.5)

## 2020-10-01 ENCOUNTER — Other Ambulatory Visit: Payer: Self-pay | Admitting: Family Medicine

## 2020-10-06 DIAGNOSIS — Z0100 Encounter for examination of eyes and vision without abnormal findings: Secondary | ICD-10-CM | POA: Diagnosis not present

## 2020-10-06 DIAGNOSIS — E119 Type 2 diabetes mellitus without complications: Secondary | ICD-10-CM | POA: Diagnosis not present

## 2020-10-06 DIAGNOSIS — H5213 Myopia, bilateral: Secondary | ICD-10-CM | POA: Diagnosis not present

## 2020-10-08 ENCOUNTER — Ambulatory Visit (INDEPENDENT_AMBULATORY_CARE_PROVIDER_SITE_OTHER): Payer: Medicare HMO | Admitting: Family Medicine

## 2020-10-08 ENCOUNTER — Encounter: Payer: Self-pay | Admitting: Family Medicine

## 2020-10-08 ENCOUNTER — Other Ambulatory Visit: Payer: Self-pay

## 2020-10-08 VITALS — BP 134/84 | Temp 97.2°F | Wt 224.8 lb

## 2020-10-08 DIAGNOSIS — Z1231 Encounter for screening mammogram for malignant neoplasm of breast: Secondary | ICD-10-CM

## 2020-10-08 DIAGNOSIS — Z13820 Encounter for screening for osteoporosis: Secondary | ICD-10-CM

## 2020-10-08 DIAGNOSIS — I10 Essential (primary) hypertension: Secondary | ICD-10-CM

## 2020-10-08 DIAGNOSIS — D692 Other nonthrombocytopenic purpura: Secondary | ICD-10-CM | POA: Diagnosis not present

## 2020-10-08 DIAGNOSIS — M170 Bilateral primary osteoarthritis of knee: Secondary | ICD-10-CM

## 2020-10-08 MED ORDER — ALPRAZOLAM 0.5 MG PO TABS: ORAL_TABLET | ORAL | 2 refills | Status: DC

## 2020-10-08 MED ORDER — TRAMADOL HCL 50 MG PO TABS: ORAL_TABLET | ORAL | 5 refills | Status: DC

## 2020-10-08 MED ORDER — NAPROXEN 500 MG PO TABS: ORAL_TABLET | ORAL | 1 refills | Status: DC

## 2020-10-08 MED ORDER — LISINOPRIL 40 MG PO TABS: 40.0000 mg | ORAL_TABLET | Freq: Every evening | ORAL | 1 refills | Status: DC

## 2020-10-08 MED ORDER — HYDROCHLOROTHIAZIDE 25 MG PO TABS: 1.0000 | ORAL_TABLET | Freq: Every day | ORAL | 1 refills | Status: DC

## 2020-10-08 NOTE — Patient Instructions (Signed)

## 2020-10-08 NOTE — Progress Notes (Signed)
   Subjective:    Patient ID: Susan Oneill, female    DOB: 04/08/1951, 70 y.o.   MRN: [id number]  Hypertension This is a chronic problem. There are no compliance problems.   pt has cut back on Naprosyn and Tramadol. Pt is taking Lisinopril 40 mg daily.  Patient is trying to better job of staying physically active and watching her diet and eating better.  Cholesterol LDL mildly elevated.  Liver functions look good. Results for orders placed or performed in visit on 04/10/20  Lipid panel  Result Value Ref Range   Cholesterol, Total 220 (H) 100 - 199 mg/dL   Triglycerides 168 (H) 0 - 149 mg/dL   HDL 58 >39 mg/dL   VLDL Cholesterol Cal 30 5 - 40 mg/dL   LDL Chol Calc (NIH) 132 (H) 0 - 99 mg/dL   Chol/HDL Ratio 3.8 0.0 - 4.4 ratio  Hepatic function panel  Result Value Ref Range   Total Protein 6.9 6.0 - 8.5 g/dL   Albumin 4.4 3.8 - 4.8 g/dL   Bilirubin Total 0.3 0.0 - 1.2 mg/dL   Bilirubin, Direct <0.10 0.00 - 0.40 mg/dL   Alkaline Phosphatase 80 44 - 121 IU/L   AST 14 0 - 40 IU/L   ALT 13 0 - 32 IU/L     Review of Systems See above    Objective:   Physical Exam Vitals reviewed.  Constitutional:      General: She is not in acute distress. HENT:     Head: Normocephalic and atraumatic.  Eyes:     General:        Right eye: No discharge.        Left eye: No discharge.  Neck:     Trachea: No tracheal deviation.  Cardiovascular:     Rate and Rhythm: Normal rate and regular rhythm.     Heart sounds: Normal heart sounds. No murmur heard.   Pulmonary:     Effort: Pulmonary effort is normal. No respiratory distress.     Breath sounds: Normal breath sounds.  Lymphadenopathy:     Cervical: No cervical adenopathy.  Skin:    General: Skin is warm and dry.  Neurological:     Mental Status: She is alert.     Coordination: Coordination normal.  Psychiatric:        Behavior: Behavior normal.     Has senile purpura not severe on the arms      Assessment & Plan:  1.  Primary hypertension Blood pressure good control continue current measures Continue medications 2. Osteoarthritis of both knees, unspecified osteoarthritis type Significant osteoarthritis.  Very important for patient to the best he can at trying to eat healthy stay active keep her weight down Decrease Naprosyn do not take twice daily unless absolutely necessary to lessen the risk of kidney problems Taper down on tramadol  - DG Bone Density  3. Screening mammogram for breast cancer Mammoordered - MM 3D SCREEN BREAST BILATERAL Senile purpura not severe

## 2020-10-12 NOTE — Progress Notes (Signed)
Subjective:   Susan Oneill is a 70 y.o. female who presents for an Initial Medicare Annual Wellness Visit.  I connected with Susan Oneill today by telephone and verified that I am speaking with the correct person using two identifiers. Location patient: home Location provider: work Persons participating in the virtual visit: patient, provider.   I discussed the limitations, risks, security and privacy concerns of performing an evaluation and management service by telephone and the availability of in person appointments. I also discussed with the patient that there may be a patient responsible charge related to this service. The patient expressed understanding and verbally consented to this telephonic visit.    Interactive audio and video telecommunications were attempted between this provider and patient, however failed, due to patient having technical difficulties OR patient did not have access to video capability.  We continued and completed visit with audio only.      Review of Systems    N/A  Cardiac Risk Factors include: advanced age (>76men, >30 women);obesity (BMI >30kg/m2);diabetes mellitus;dyslipidemia;hypertension     Objective:    Today's Vitals   There is no height or weight on file to calculate BMI.  Advanced Directives 10/13/2020 03/23/2020 09/11/2018 09/06/2018 10/21/2013 10/17/2013 11/16/2012  Does Patient Have a Medical Advance Directive? No No No No Patient does not have advance directive;Patient would not like information Patient does not have advance directive;Patient would not like information Patient does not have advance directive;Patient would not like information  Would patient like information on creating a medical advance directive? No - Patient declined No - Patient declined No - Patient declined No - Patient declined - - -  Pre-existing out of facility DNR order (yellow form or pink MOST form) - - - - No - -    Current Medications  (verified) Outpatient Encounter Medications as of 10/13/2020  Medication Sig  . ALPRAZolam (XANAX) 0.5 MG tablet TAKE 1/2- TO 1 TABLET BY MOUTH TWO TIMES A DAY AS NEEDED  . diphenhydramine-acetaminophen (TYLENOL PM) 25-500 MG TABS tablet Take 1-2 tablets by mouth at bedtime as needed (sleep).  Marland Kitchen esomeprazole (NEXIUM) 20 MG capsule Take 20 mg by mouth daily before breakfast.   . hydrochlorothiazide (HYDRODIURIL) 25 MG tablet Take 1 tablet (25 mg total) by mouth daily.  Marland Kitchen lisinopril (ZESTRIL) 40 MG tablet Take 1 tablet (40 mg total) by mouth every evening.  . naproxen (NAPROSYN) 500 MG tablet TAKE ONE TABLET TWICE DAILY WITH A MEAL AS NEEDED FOR PAIN  . traMADol (ULTRAM) 50 MG tablet Take one tablet po TID prn  . mometasone (ELOCON) 0.1 % cream APPLY TO AFFECTED AREA DAILY (Patient not taking: Reported on 10/13/2020)   No facility-administered encounter medications on file as of 10/13/2020.    Allergies (verified) Amoxicillin, Augmentin [amoxicillin-pot clavulanate], Ciprofloxacin, Codeine, Latex, Mobic [meloxicam], and Sulfonamide derivatives   History: Past Medical History:  Diagnosis Date  . Acid reflux   . Anxiety    xanax as needed  . Anxiousness 09/25/2019  . Arthritis    oa and pain both knees  . Atypical chest pain 5 / 2014   DISCHARGE SUMMARY [facility] - RULLED OUT FOR MI, NEGATIVE NUCLEAR STRESS TEST - PAIN ATTRIBUTED TO PT'S GERD- PT STATES NO FURTHER CHEST PAINS - [place] HAS HELPED PAIN  . Cyst in hand    ?? CYST BACK OF LEFT HAND - PT STATES THE AREA POPPED UP - RAISED AREA - SOMETIMES PAINFUL - HAS A BRACE TO WEAR ON THAT HAND WHEN  SLEEPING  . Hemorrhoids   . HTN (hypertension)   . Hyperlipidemia   . Tinnitus   . Transfusion history 1978   after childbirth - not all of placenta passed and pt started bleeding  . Urticaria   . Uterine fibroid    no problems from the fibroids  . Varicose veins    Past Surgical History:  Procedure Laterality Date  . CARPAL TUNNEL RELEASE      bilateral  . COLONOSCOPY WITH PROPOFOL N/A 03/23/2020   Procedure: COLONOSCOPY WITH PROPOFOL;  Surgeon: Eloise Harman, DO;  Location: [facility];  Service: Endoscopy;  Laterality: N/A;  1:30pm  . DILATATION & CURETTAGE/HYSTEROSCOPY WITH MYOSURE N/A 09/11/2018   Procedure: DILATATION & CURETTAGE/HYSTEROSCOPY WITH MYOSURE;  Surgeon: Donnamae Jude, MD;  Location: [facility];  Service: Gynecology;  Laterality: N/A;  . HEMORROIDECTOMY    . HERNIA REPAIR  [id number]   umbilical hernia repair  . KNEE SURGERY     bilateral knee arthroscopy- rt knee sept 2011, left knee march 2010  . ROTATOR CUFF REPAIR    . TOTAL KNEE ARTHROPLASTY Right 10/21/2013   Procedure: RIGHT TOTAL KNEE ARTHROPLASTY;  Surgeon: Gearlean Alf, MD;  Location: [facility];  Service: Orthopedics;  Laterality: Right;   Family History  Problem Relation Age of Onset  . Arthritis Other   . Heart disease Other   . Heart disease Father   . High blood pressure Father   . Colon cancer Paternal Uncle    Social History   Socioeconomic History  . Marital status: Married    Spouse name: Not on file  . Number of children: Not on file  . Years of education: 72  . Highest education level: Not on file  Occupational History  . Occupation: Psychologist, educational  Tobacco Use  . Smoking status: Never Smoker  . Smokeless tobacco: Never Used  Vaping Use  . Vaping Use: Never used  Substance and Sexual Activity  . Alcohol use: No  . Drug use: No  . Sexual activity: Yes  Other Topics Concern  . Not on file  Social History Narrative  . Not on file   Social Determinants of Health   Financial Resource Strain: Not on file  Food Insecurity: Not on file  Transportation Needs: Not on file  Physical Activity: Not on file  Stress: Not on file  Social Connections: Moderately Integrated  . Frequency of Communication with Friends and Family: More than three times a week  . Frequency of Social Gatherings with Friends and Family:  Three times a week  . Attends Religious Services: 1 to 4 times per year  . Active Member of Clubs or Organizations: No  . Attends Archivist Meetings: Never  . Marital Status: Married    Tobacco Counseling Counseling given: Not Answered   Clinical Intake:  Pre-visit preparation completed: Yes  Pain : No/denies pain     Nutritional Risks: None Diabetes: No  How often do you need to have someone help you when you read instructions, pamphlets, or other written materials from your doctor or pharmacy?: 1 - Never  Diabetic?No  Interpreter Needed?: No  Information entered by :: [place] of Daily Living In your present state of health, do you have any difficulty performing the following activities: 10/13/2020  Hearing? N  Vision? N  Difficulty concentrating or making decisions? N  Walking or climbing stairs? Y  Comment has knee pain  Dressing or bathing? N  Doing  errands, shopping? N  Preparing Food and eating ? N  Using the Toilet? N  In the past six months, have you accidently leaked urine? N  Do you have problems with loss of bowel control? N  Managing your Medications? N  Managing your Finances? N  Housekeeping or managing your Housekeeping? N  Some recent data might be hidden    Patient Care Team: Kathyrn Drown, MD as PCP - General (Family Medicine)  Indicate any recent Medical Services you may have received from other than [facility] providers in the past year (date may be approximate).     Assessment:   This is a routine wellness examination for Webb.  Hearing/Vision screen  Hearing Screening   125Hz  250Hz  500Hz  1000Hz  2000Hz  3000Hz  4000Hz  6000Hz  8000Hz   Right ear:           Left ear:           Vision Screening Comments: Patient states gets eyes examined once per year. Wears glasses   Dietary issues and exercise activities discussed: Current Exercise Habits: The patient does not participate in regular exercise at present,  Exercise limited by: None identified  Goals    . Exercise 3x per week (30 min per time)    . Weight (lb) < 200 lb (90.7 kg)      Depression Screen PHQ 2/9 Scores 10/13/2020 10/08/2020 05/29/2017 11/02/2015  PHQ - 2 Score 0 0 0 0  PHQ- 9 Score 0 - - -    Fall Risk Fall Risk  10/13/2020 10/08/2020 09/25/2019 03/28/2019 11/02/2015  Falls in the past year? 0 0 0 0 No  Number falls in past yr: 0 0 - - -  Injury with Fall? 0 0 - - -  Risk for fall due to : No Fall Risks No Fall Risks - - -  Follow up Falls evaluation completed;Falls prevention discussed Falls evaluation completed - - -    FALL RISK PREVENTION PERTAINING TO THE HOME:  Any stairs in or around the home? No  If so, are there any without handrails? No  Home free of loose throw rugs in walkways, pet beds, electrical cords, etc? Yes  Adequate lighting in your home to reduce risk of falls? Yes   ASSISTIVE DEVICES UTILIZED TO PREVENT FALLS:  Life alert? No  Use of a cane, walker or w/c? No  Grab bars in the bathroom? Yes  Shower chair or bench in shower? No  Elevated toilet seat or a handicapped toilet? Yes    Cognitive Function:     Normal cognitive status assessed by direct observation by this Nurse Health Advisor. No abnormalities found.      Immunizations Immunization History  Administered Date(s) Administered  . Fluad Quad(high Dose 65+) 03/28/2019, 04/10/2020  . Influenza-Unspecified 05/04/2018  . Moderna SARS-COV2 Booster Vaccination 06/15/2020  . Moderna Sars-Covid-2 Vaccination 08/16/2019, 09/13/2019  . Pneumococcal Conjugate-13 11/27/2018  . Pneumococcal Polysaccharide-23 04/10/2020  . Zoster Recombinat (Shingrix) 03/22/2019, 04/04/2019, 06/04/2019    TDAP status: Due, Education has been provided regarding the importance of this vaccine. Advised may receive this vaccine at local pharmacy or Health Dept. Aware to provide a copy of the vaccination record if obtained from local pharmacy or Health Dept. Verbalized  acceptance and understanding.  Flu Vaccine status: Up to date  Pneumococcal vaccine status: Up to date  Covid-19 vaccine status: Completed vaccines  Qualifies for Shingles Vaccine? Yes   Zostavax completed No   Shingrix Completed?: Yes  Screening Tests Health Maintenance  Topic  Date Due  . TETANUS/TDAP  Never done  . DEXA SCAN  Never done  . MAMMOGRAM  08/29/2019  . INFLUENZA VACCINE  02/01/2021  . COLONOSCOPY (Pts 45-9yrs Insurance coverage will need to be confirmed)  03/23/2030  . COVID-19 Vaccine  Completed  . Hepatitis C Screening  Completed  . PNA vac Low Risk Adult  Completed  . HPV VACCINES  Aged Out    Health Maintenance  Health Maintenance Due  Topic Date Due  . TETANUS/TDAP  Never done  . DEXA SCAN  Never done  . MAMMOGRAM  08/29/2019    Colorectal cancer screening: Type of screening: Colonoscopy. Completed 03/23/2020. Repeat every 10 years  Mammogram status: Ordered 10/08/2020. Pt provided with contact info and advised to call to schedule appt.   Bone Density status: Ordered 10/08/2020. Pt provided with contact info and advised to call to schedule appt.  Lung Cancer Screening: (Low Dose CT Chest recommended if Age 24-80 years, 30 pack-year currently smoking OR have quit w/in 15years.) does not qualify.   Lung Cancer Screening Referral: N/A   Additional Screening:  Hepatitis C Screening: does qualify; Completed 07/15/2017  Vision Screening: Recommended annual ophthalmology exams for early detection of glaucoma and other disorders of the eye. Is the patient up to date with their annual eye exam?  Yes  Who is the provider or what is the name of the office in which the patient attends annual eye exams? Dr. Chong Sicilian  If pt is not established with a provider, would they like to be referred to a provider to establish care? No .   Dental Screening: Recommended annual dental exams for proper oral hygiene  Community Resource Referral / Chronic Care  Management: CRR required this visit?  No   CCM required this visit?  No      Plan:     I have personally reviewed and noted the following in the patient's chart:   . Medical and social history . Use of alcohol, tobacco or illicit drugs  . Current medications and supplements . Functional ability and status . Nutritional status . Physical activity . Advanced directives . List of other physicians . Hospitalizations, surgeries, and ER visits in previous 12 months . Vitals . Screenings to include cognitive, depression, and falls . Referrals and appointments  In addition, I have reviewed and discussed with patient certain preventive protocols, quality metrics, and best practice recommendations. A written personalized care plan for preventive services as well as general preventive health recommendations were provided to patient.     Ofilia Neas, LPN   07/06/7251   Nurse Notes: None

## 2020-10-13 ENCOUNTER — Other Ambulatory Visit: Payer: Self-pay

## 2020-10-13 ENCOUNTER — Ambulatory Visit (INDEPENDENT_AMBULATORY_CARE_PROVIDER_SITE_OTHER): Payer: Medicare HMO

## 2020-10-13 DIAGNOSIS — Z0000 Encounter for general adult medical examination without abnormal findings: Secondary | ICD-10-CM | POA: Diagnosis not present

## 2020-10-13 NOTE — Patient Instructions (Signed)
Susan Oneill , Thank you for taking time to come for your Medicare Wellness Visit. I appreciate your ongoing commitment to your health goals. Please review the following plan we discussed and let me know if I can assist you in the future.   Screening recommendations/referrals: Colonoscopy: Up to date, next due 03/23/2030 Mammogram: Currently due, keep upcoming appointment on 10/26/2020 Bone Density: Currently due, keep upcoming appointment on 10/26/2020 Recommended yearly ophthalmology/optometry visit for glaucoma screening and checkup Recommended yearly dental visit for hygiene and checkup  Vaccinations: Influenza vaccine: Up to date, next due fall 2022  Pneumococcal vaccine: completed series  Tdap vaccine: Currently due, you may await and injury to receive or you may receive at your pharmacy as it will be cheaper. Shingles vaccine: Completed series    Advanced directives: Advance directive discussed with you today. Even though you declined this today please call our office should you change your mind and we can give you the proper paperwork for you to fill out.   Conditions/risks identified: None   Next appointment: None    Preventive Care 65 Years and Older, Female Preventive care refers to lifestyle choices and visits with your health care provider that can promote health and wellness. What does preventive care include?  A yearly physical exam. This is also called an annual well check.  Dental exams once or twice a year.  Routine eye exams. Ask your health care provider how often you should have your eyes checked.  Personal lifestyle choices, including:  Daily care of your teeth and gums.  Regular physical activity.  Eating a healthy diet.  Avoiding tobacco and drug use.  Limiting alcohol use.  Practicing safe sex.  Taking low-dose aspirin every day.  Taking vitamin and mineral supplements as recommended by your health care provider. What happens during an  annual well check? The services and screenings done by your health care provider during your annual well check will depend on your age, overall health, lifestyle risk factors, and family history of disease. Counseling  Your health care provider may ask you questions about your:  Alcohol use.  Tobacco use.  Drug use.  Emotional well-being.  Home and relationship well-being.  Sexual activity.  Eating habits.  History of falls.  Memory and ability to understand (cognition).  Work and work Statistician.  Reproductive health. Screening  You may have the following tests or measurements:  Height, weight, and BMI.  Blood pressure.  Lipid and cholesterol levels. These may be checked every 5 years, or more frequently if you are over 71 years old.  Skin check.  Lung cancer screening. You may have this screening every year starting at age 4 if you have a 30-pack-year history of smoking and currently smoke or have quit within the past 15 years.  Fecal occult blood test (FOBT) of the stool. You may have this test every year starting at age 48.  Flexible sigmoidoscopy or colonoscopy. You may have a sigmoidoscopy every 5 years or a colonoscopy every 10 years starting at age 65.  Hepatitis C blood test.  Hepatitis B blood test.  Sexually transmitted disease (STD) testing.  Diabetes screening. This is done by checking your blood sugar (glucose) after you have not eaten for a while (fasting). You may have this done every 1-3 years.  Bone density scan. This is done to screen for osteoporosis. You may have this done starting at age 3.  Mammogram. This may be done every 1-2 years. Talk to your health care provider  about how often you should have regular mammograms. Talk with your health care provider about your test results, treatment options, and if necessary, the need for more tests. Vaccines  Your health care provider may recommend certain vaccines, such as:  Influenza  vaccine. This is recommended every year.  Tetanus, diphtheria, and acellular pertussis (Tdap, Td) vaccine. You may need a Td booster every 10 years.  Zoster vaccine. You may need this after age 30.  Pneumococcal 13-valent conjugate (PCV13) vaccine. One dose is recommended after age 51.  Pneumococcal polysaccharide (PPSV23) vaccine. One dose is recommended after age 51. Talk to your health care provider about which screenings and vaccines you need and how often you need them. This information is not intended to replace advice given to you by your health care provider. Make sure you discuss any questions you have with your health care provider. Document Released: 07/17/2015 Document Revised: 03/09/2016 Document Reviewed: 04/21/2015 Elsevier Interactive Patient Education  2017 [place] Prevention in the Home Falls can cause injuries. They can happen to people of all ages. There are many things you can do to make your home safe and to help prevent falls. What can I do on the outside of my home?  Regularly fix the edges of walkways and driveways and fix any cracks.  Remove anything that might make you trip as you walk through a door, such as a raised step or threshold.  Trim any bushes or trees on the path to your home.  Use bright outdoor lighting.  Clear any walking paths of anything that might make someone trip, such as rocks or tools.  Regularly check to see if handrails are loose or broken. Make sure that both sides of any steps have handrails.  Any raised decks and porches should have guardrails on the edges.  Have any leaves, snow, or ice cleared regularly.  Use sand or salt on walking paths during winter.  Clean up any spills in your garage right away. This includes oil or grease spills. What can I do in the bathroom?  Use night lights.  Install grab bars by the toilet and in the tub and shower. Do not use towel bars as grab bars.  Use non-skid mats or decals  in the tub or shower.  If you need to sit down in the shower, use a plastic, non-slip stool.  Keep the floor dry. Clean up any water that spills on the floor as soon as it happens.  Remove soap buildup in the tub or shower regularly.  Attach bath mats securely with double-sided non-slip rug tape.  Do not have throw rugs and other things on the floor that can make you trip. What can I do in the bedroom?  Use night lights.  Make sure that you have a light by your bed that is easy to reach.  Do not use any sheets or blankets that are too big for your bed. They should not hang down onto the floor.  Have a firm chair that has side arms. You can use this for support while you get dressed.  Do not have throw rugs and other things on the floor that can make you trip. What can I do in the kitchen?  Clean up any spills right away.  Avoid walking on wet floors.  Keep items that you use a lot in easy-to-reach places.  If you need to reach something above you, use a strong step stool that has a grab bar.  Keep electrical cords out of the way.  Do not use floor polish or wax that makes floors slippery. If you must use wax, use non-skid floor wax.  Do not have throw rugs and other things on the floor that can make you trip. What can I do with my stairs?  Do not leave any items on the stairs.  Make sure that there are handrails on both sides of the stairs and use them. Fix handrails that are broken or loose. Make sure that handrails are as long as the stairways.  Check any carpeting to make sure that it is firmly attached to the stairs. Fix any carpet that is loose or worn.  Avoid having throw rugs at the top or bottom of the stairs. If you do have throw rugs, attach them to the floor with carpet tape.  Make sure that you have a light switch at the top of the stairs and the bottom of the stairs. If you do not have them, ask someone to add them for you. What else can I do to help  prevent falls?  Wear shoes that:  Do not have high heels.  Have rubber bottoms.  Are comfortable and fit you well.  Are closed at the toe. Do not wear sandals.  If you use a stepladder:  Make sure that it is fully opened. Do not climb a closed stepladder.  Make sure that both sides of the stepladder are locked into place.  Ask someone to hold it for you, if possible.  Clearly mark and make sure that you can see:  Any grab bars or handrails.  First and last steps.  Where the edge of each step is.  Use tools that help you move around (mobility aids) if they are needed. These include:  Canes.  Walkers.  Scooters.  Crutches.  Turn on the lights when you go into a dark area. Replace any light bulbs as soon as they burn out.  Set up your furniture so you have a clear path. Avoid moving your furniture around.  If any of your floors are uneven, fix them.  If there are any pets around you, be aware of where they are.  Review your medicines with your doctor. Some medicines can make you feel dizzy. This can increase your chance of falling. Ask your doctor what other things that you can do to help prevent falls. This information is not intended to replace advice given to you by your health care provider. Make sure you discuss any questions you have with your health care provider. Document Released: 04/16/2009 Document Revised: 11/26/2015 Document Reviewed: 07/25/2014 Elsevier Interactive Patient Education  2017 [employer].

## 2020-10-26 ENCOUNTER — Other Ambulatory Visit (HOSPITAL_COMMUNITY): Payer: Medicare HMO

## 2020-10-26 ENCOUNTER — Ambulatory Visit (HOSPITAL_COMMUNITY): Payer: Medicare HMO

## 2020-11-05 ENCOUNTER — Other Ambulatory Visit (HOSPITAL_COMMUNITY): Payer: Medicare HMO

## 2020-11-05 ENCOUNTER — Ambulatory Visit (HOSPITAL_COMMUNITY): Payer: Medicare HMO

## 2020-11-11 ENCOUNTER — Other Ambulatory Visit: Payer: Self-pay

## 2020-11-11 ENCOUNTER — Ambulatory Visit (HOSPITAL_COMMUNITY)
Admission: RE | Admit: 2020-11-11 | Discharge: 2020-11-11 | Disposition: A | Discharge status: Home / Self Care | Payer: Medicare HMO | Source: Ambulatory Visit | Attending: Family Medicine | Admitting: Family Medicine

## 2020-11-11 DIAGNOSIS — M85852 Other specified disorders of bone density and structure, left thigh: Secondary | ICD-10-CM | POA: Insufficient documentation

## 2020-11-11 DIAGNOSIS — Z780 Asymptomatic menopausal state: Secondary | ICD-10-CM | POA: Diagnosis not present

## 2020-11-11 DIAGNOSIS — Z1231 Encounter for screening mammogram for malignant neoplasm of breast: Secondary | ICD-10-CM | POA: Insufficient documentation

## 2020-11-11 DIAGNOSIS — Z13820 Encounter for screening for osteoporosis: Secondary | ICD-10-CM | POA: Diagnosis not present

## 2020-11-11 DIAGNOSIS — M170 Bilateral primary osteoarthritis of knee: Secondary | ICD-10-CM | POA: Diagnosis not present

## 2020-12-22 ENCOUNTER — Other Ambulatory Visit: Payer: Self-pay

## 2020-12-22 ENCOUNTER — Other Ambulatory Visit: Payer: Self-pay | Admitting: Family Medicine

## 2020-12-22 ENCOUNTER — Encounter: Payer: Self-pay | Admitting: Family Medicine

## 2020-12-22 ENCOUNTER — Ambulatory Visit (INDEPENDENT_AMBULATORY_CARE_PROVIDER_SITE_OTHER): Payer: Medicare HMO | Admitting: Family Medicine

## 2020-12-22 VITALS — BP 143/85 | HR 75 | Temp 98.1°F | Ht 65.0 in | Wt 218.0 lb

## 2020-12-22 DIAGNOSIS — F321 Major depressive disorder, single episode, moderate: Secondary | ICD-10-CM

## 2020-12-22 DIAGNOSIS — R32 Unspecified urinary incontinence: Secondary | ICD-10-CM

## 2020-12-22 DIAGNOSIS — N3000 Acute cystitis without hematuria: Secondary | ICD-10-CM

## 2020-12-22 LAB — POCT URINALYSIS DIPSTICK
Spec Grav, UA: 1.015 (ref 1.010–1.025)
pH, UA: 5 (ref 5.0–8.0)

## 2020-12-22 MED ORDER — ESCITALOPRAM OXALATE 10 MG PO TABS: 10.0000 mg | ORAL_TABLET | Freq: Every day | ORAL | 3 refills | Status: DC

## 2020-12-22 MED ORDER — CEPHALEXIN 500 MG PO CAPS: 500.0000 mg | ORAL_CAPSULE | Freq: Three times a day (TID) | ORAL | 0 refills | Status: DC

## 2020-12-22 NOTE — Progress Notes (Signed)
   Subjective:    Patient ID: Susan Oneill, female    DOB: September 22, 1950, 70 y.o.   MRN: [id number]  HPI Urinary leakage getting worse than normal. Started about one month ago.  Patient relates some dysuria urinary frequency.  She also relates finding her self feeling sad down blue depressed about the loss of her husband.  She feels like things just are not getting any better Results for orders placed or performed in visit on 12/22/20  POCT urinalysis dipstick  Result Value Ref Range   Color, UA     Clarity, UA     Glucose, UA     Bilirubin, UA     Ketones, UA     Spec Grav, UA 1.015 1.010 - 1.025   Blood, UA     pH, UA 5.0 5.0 - 8.0   Protein, UA     Urobilinogen, UA     Nitrite, UA     Leukocytes, UA Moderate (2+) (A) Negative   Appearance     Odor       Review of Systems     Objective:   Physical Exam  Lungs clear heart regular HEENT benign flanks nontender      Assessment & Plan:  Moderate depression Not suicidal Lexapro as directed Warning signs discussed Recheck in 3 weeks  UTI culture taken antibiotic prescribed warning signs discussed follow-up if problems

## 2020-12-25 LAB — URINE CULTURE

## 2020-12-25 LAB — SPECIMEN STATUS REPORT

## 2021-01-12 ENCOUNTER — Ambulatory Visit (INDEPENDENT_AMBULATORY_CARE_PROVIDER_SITE_OTHER): Payer: Medicare HMO | Admitting: Family Medicine

## 2021-01-12 ENCOUNTER — Encounter: Payer: Self-pay | Admitting: Family Medicine

## 2021-01-12 ENCOUNTER — Other Ambulatory Visit: Payer: Self-pay

## 2021-01-12 VITALS — BP 128/84 | HR 102 | Temp 97.4°F | Ht 65.0 in | Wt 217.0 lb

## 2021-01-12 DIAGNOSIS — F321 Major depressive disorder, single episode, moderate: Secondary | ICD-10-CM | POA: Diagnosis not present

## 2021-01-12 MED ORDER — ESCITALOPRAM OXALATE 10 MG PO TABS: 10.0000 mg | ORAL_TABLET | Freq: Every day | ORAL | 1 refills | Status: DC

## 2021-01-12 NOTE — Progress Notes (Signed)
   Subjective:    Patient ID: Susan Oneill, female    DOB: 06/28/1951, 70 y.o.   MRN: [id number]  HPI Follow up on depression. Pt states she is doing better since starting lexapro.  Patient relates medication is helping.  Denies any major setbacks.  States energy level doing better.  20-minute discussion held regarding this issue Coping mechanisms regarding loss of spouse discussed.  Review of Systems     Objective:   Physical Exam  Blood pressure stable Patient going to [place] her family    Assessment & Plan:  Depression moderate continue Lexapro this is benefiting her.  No significant setbacks since last MSR Recheck her again in 6 to 8 weeks time with virtual visit or phone visit

## 2021-03-03 ENCOUNTER — Telehealth: Payer: Self-pay | Admitting: *Deleted

## 2021-03-03 ENCOUNTER — Other Ambulatory Visit: Payer: Self-pay

## 2021-03-03 ENCOUNTER — Other Ambulatory Visit: Payer: Self-pay | Admitting: Family Medicine

## 2021-03-03 ENCOUNTER — Ambulatory Visit (INDEPENDENT_AMBULATORY_CARE_PROVIDER_SITE_OTHER): Payer: Medicare HMO | Admitting: Family Medicine

## 2021-03-03 DIAGNOSIS — Z79899 Other long term (current) drug therapy: Secondary | ICD-10-CM

## 2021-03-03 DIAGNOSIS — F321 Major depressive disorder, single episode, moderate: Secondary | ICD-10-CM

## 2021-03-03 DIAGNOSIS — I10 Essential (primary) hypertension: Secondary | ICD-10-CM

## 2021-03-03 DIAGNOSIS — E785 Hyperlipidemia, unspecified: Secondary | ICD-10-CM

## 2021-03-03 NOTE — Progress Notes (Signed)
   Subjective:    Patient ID: Susan Oneill, female    DOB: Oct 29, 1950, 70 y.o.   MRN: [id number]  HPI Patient calls for a follow up on depression/grief. Patient states she is feeling better.  Patient is taking the medicine It is helping She was able to go see her daughter Going on vacation This is help she is also had family members her sister came in from [place] for a visit She feels she is improving with the medicine she would like to continue the medicine Virtual Visit via Telephone Note  I connected with Cinnamon B Gutmann on 03/03/21 at 10:10 AM EDT by telephone and verified that I am speaking with the correct person using two identifiers.  Location: Patient: home Provider: office   I discussed the limitations, risks, security and privacy concerns of performing an evaluation and management service by telephone and the availability of in person appointments. I also discussed with the patient that there may be a patient responsible charge related to this service. The patient expressed understanding and agreed to proceed.   History of Present Illness:    Observations/Objective:   Assessment and Plan:   Follow Up Instructions:    I discussed the assessment and treatment plan with the patient. The patient was provided an opportunity to ask questions and all were answered. The patient agreed with the plan and demonstrated an understanding of the instructions.   The patient was advised to call back or seek an in-person evaluation if the symptoms worsen or if the condition fails to improve as anticipated.  I provided 12 minutes of non-face-to-face time during this encounter.      Review of Systems     Objective:   Physical Exam  Today's visit was via telephone Physical exam was not possible for this visit       Assessment & Plan:  Depression is improving.  Continue current measures.  Follow-up again in approximately 4 months lab work before next follow-up  visit.

## 2021-03-03 NOTE — Telephone Encounter (Signed)
Ms. magnolia, kowitz are scheduled for a virtual visit with your provider today.    Just as we do with appointments in the office, we must obtain your consent to participate.  Your consent will be active for this visit and any virtual visit you may have with one of our providers in the next 365 days.    If you have a MyChart account, I can also send a copy of this consent to you electronically.  All virtual visits are billed to your insurance company just like a traditional visit in the office.  As this is a virtual visit, video technology does not allow for your provider to perform a traditional examination.  This may limit your provider's ability to fully assess your condition.  If your provider identifies any concerns that need to be evaluated in person or the need to arrange testing such as labs, EKG, etc, we will make arrangements to do so.    Although advances in technology are sophisticated, we cannot ensure that it will always work on either your end or our end.  If the connection with a video visit is poor, we may have to switch to a telephone visit.  With either a video or telephone visit, we are not always able to ensure that we have a secure connection.   I need to obtain your verbal consent now.   Are you willing to proceed with your visit today?   Kearie B Arrambide has provided verbal consent on 03/03/2021 for a virtual visit (video or telephone).

## 2021-03-18 ENCOUNTER — Telehealth: Payer: Self-pay | Admitting: *Deleted

## 2021-03-18 NOTE — Chronic Care Management (AMB) (Signed)
  Chronic Care Management   Note  03/18/2021 Name: AUDINE MANGIONE MRN: [id number] DOB: [birth year]  Analeise Mccleery Ogarro is a 70 y.o. year old female who is a primary care patient of Luking, Elayne Snare, MD. I reached out to Potter by phone today in response to a referral sent by Ms. Donnie Aho Hewitt's PCP, Dr. Wolfgang Phoenix       Ms. Pruitt was given information about Chronic Care Management services today including:  CCM service includes personalized support from designated clinical staff supervised by her physician, including individualized plan of care and coordination with other care providers 24/7 contact phone numbers for assistance for urgent and routine care needs. Service will only be billed when office clinical staff spend 20 minutes or more in a month to coordinate care. Only one practitioner may furnish and bill the service in a calendar month. The patient may stop CCM services at any time (effective at the end of the month) by phone call to the office staff. The patient will be responsible for cost sharing (co-pay) of up to 20% of the service fee (after annual deductible is met).  Patient agreed to services and verbal consent obtained.   Follow up plan: Telephone appointment with care management team member scheduled for:03/22/21  Stacey Snead  Care Guide, Embedded Care Coordination [place]  Care Management  Direct Dial: [phone]

## 2021-03-26 ENCOUNTER — Ambulatory Visit (INDEPENDENT_AMBULATORY_CARE_PROVIDER_SITE_OTHER): Payer: Medicare HMO | Admitting: Pharmacist

## 2021-03-26 DIAGNOSIS — M170 Bilateral primary osteoarthritis of knee: Secondary | ICD-10-CM

## 2021-03-26 DIAGNOSIS — F419 Anxiety disorder, unspecified: Secondary | ICD-10-CM

## 2021-03-26 DIAGNOSIS — E785 Hyperlipidemia, unspecified: Secondary | ICD-10-CM

## 2021-03-26 DIAGNOSIS — I10 Essential (primary) hypertension: Secondary | ICD-10-CM

## 2021-03-26 NOTE — Patient Instructions (Signed)
Susan Oneill,  It was great to talk to you today!  Please call me with any questions or concerns.   Visit Information   PATIENT GOALS:   Goals Addressed             This Visit's Progress    Medication Management       Patient Goals/Self-Care Activities Over the next 90 days, patient will:  Take medications as prescribed Check blood pressure at least once daily, document, and provide at future appointments Engage in dietary modifications by less frequent dining out and decreased fat intake Start walking or exercising more frequently        Consent to CCM Services: Susan Oneill was given information about Chronic Care Management services including:  CCM service includes personalized support from designated clinical staff supervised by her physician, including individualized plan of care and coordination with other care providers 24/7 contact phone numbers for assistance for urgent and routine care needs. Service will only be billed when office clinical staff spend 20 minutes or more in a month to coordinate care. Only one practitioner may furnish and bill the service in a calendar month. The patient may stop CCM services at any time (effective at the end of the month) by phone call to the office staff. The patient will be responsible for cost sharing (co-pay) of up to 20% of the service fee (after annual deductible is met).  Patient agreed to services and verbal consent obtained.   Patient verbalizes understanding of instructions provided today and agrees to view in [place].   Telephone follow up appointment with care management team member scheduled for:04/23/21  Kennon Holter, PharmD Clinical Pharmacist [place] [phone]  CLINICAL CARE PLAN: Patient Care Plan: Medication Management     Problem Identified: HTN, HLD, OA, and Anxiety   Priority: High  Onset Date: 03/26/2021     Long-Range Goal: Disease Progression Prevention    Start Date: 03/26/2021  Expected End Date: 06/24/2021  This Visit's Progress: On track  Priority: High  Note:   Current Barriers:  Unable to achieve control of hyperlipidemia  Pharmacist Clinical Goal(s):  Over the next 90 days, patient will Achieve control of hyperlipidemia as evidenced by improved LDL and improved triglycerides through collaboration with PharmD and provider.   Interventions: 1:1 collaboration with Kathyrn Drown, MD regarding development and update of comprehensive plan of care as evidenced by provider attestation and co-signature Inter-disciplinary care team collaboration (see longitudinal plan of care) Comprehensive medication review performed; medication list updated in electronic medical record  Hypertension: Blood pressure under good control. Blood pressure is at goal of <130/80 mmHg per 2017 AHA/ACC guidelines. Current medications: lisinopril 40 mg by mouth every evening and hydrochlorothiazide 25 mg by mouth every morning Intolerances: none Taking medications as directed: yes Side effects thought to be attributed to current medication regimen: no Denies dizziness and lightheadedness. Current exercise: yard work Current home blood pressure: 120-130s/80s mmHg Continue lisinopril 40 mg by mouth every evening and hydrochlorothiazide 25 mg by mouth every morning Encourage dietary sodium restriction/DASH diet Recommend regular aerobic exercise Recommend home blood pressure monitoring to discuss at next visit  Hyperlipidemia: Uncontrolled. LDL above goal of <100 due to moderate risk given <2 major CV risk factors (hypertension) and 10-year risk <10% per 2020 AACE/ACE guidelines. Triglycerides above goal of <150 per 2020 AACE/ACE guidelines. Current medications:  none Intolerances: none Taking medications as directed: n/a Side effects thought to be attributed to current medication regimen: n/a Encourage dietary reduction  of high fat containing foods such as  butter, nuts, bacon, egg yolks, etc. Recommend regular aerobic exercise Reviewed risks of hyperlipidemia, principles of treatment and consequences of untreated hyperlipidemia Patient has upcoming lipid panel in a few weeks. If LDL remains elevated above goal, will consider moderate intensity statin   Osteoarthritis: Mostly controlled per patient. Some days are worse than others Current medications: naproxen 500 mg by mouth twice daily and tramadol 50 mg by mouth three time daily as needed for pain Discussed that she could also try Voltaren gel applied topically to pain areas (knees and hips) up to four times daily to help with pain. Voltaren is now available over the counter.   Anxiety: Controlled per patient Husband passed away a few months ago which has naturally been difficult for her to deal with. Her sister from [place] is currently staying with her until November which has helped. Offered chronic care management referral for social worker to discuss further but patient declines at this time but will consider when her sister leaves. Patient aware of resource if needed.  Continue escitalopram 10 mg by mouth daily and alprazolam 0.25-0.5 mg by mouth twice daily as needed for anxiety  Other: Patient reports taking Tylenol PM nearly every night for sleep. Given the anticholinergic properties of the diphenhydramine (Benadryl) in Tylenol PM, discussed that melatonin would be a safer option limit adverse events such as dry eyes, dry mouth, constipation, and cognitive impairment.   Patient Goals/Self-Care Activities Over the next 90 days, patient will:  Take medications as prescribed Check blood pressure at least once daily, document, and provide at future appointments Engage in dietary modifications by less frequent dining out and decreased fat intake Start walking or exercising more frequently  Follow Up Plan: Telephone follow up appointment with care management team member scheduled for:  04/23/21

## 2021-03-26 NOTE — Chronic Care Management (AMB) (Signed)
Chronic Care Management Pharmacy Note  03/26/2021 Name:  Susan Oneill MRN:  [id number] DOB:  [birth year]  Summary:  Patient has upcoming lipid panel in a few weeks. If LDL remains elevated above goal, will consider moderate intensity statin  Patient reports taking Tylenol PM nearly every night for sleep. Given the anticholinergic properties of the diphenhydramine (Benadryl) in Tylenol PM, discussed that melatonin would be a safer option limit adverse events such as dry eyes, dry mouth, constipation, and cognitive impairment.   Subjective: Susan Oneill is an 70 y.o. year old female who is a primary patient of Luking, Elayne Snare, MD.  The CCM team was consulted for assistance with disease management and care coordination needs.    Engaged with patient by telephone for initial visit in response to provider referral for pharmacy case management and/or care coordination services.   Consent to Services:  The patient was given the following information about Chronic Care Management services today, agreed to services, and gave verbal consent: 1. CCM service includes personalized support from designated clinical staff supervised by the primary care provider, including individualized plan of care and coordination with other care providers 2. 24/7 contact phone numbers for assistance for urgent and routine care needs. 3. Service will only be billed when office clinical staff spend 20 minutes or more in a month to coordinate care. 4. Only one practitioner may furnish and bill the service in a calendar month. 5.The patient may stop CCM services at any time (effective at the end of the month) by phone call to the office staff. 6. The patient will be responsible for cost sharing (co-pay) of up to 20% of the service fee (after annual deductible is met). Patient agreed to services and consent obtained.  Patient Care Team: Kathyrn Drown, MD as PCP - General (Family Medicine) Beryle Lathe,  [facility] (Pharmacist)  Objective:  Lab Results  Component Value Date   CREATININE 0.83 03/20/2020   CREATININE 0.82 03/28/2019   CREATININE 0.85 09/10/2018    No results found for: HGBA1C Last diabetic Eye exam: No results found for: HMDIABEYEEXA  Last diabetic Foot exam: No results found for: HMDIABFOOTEX      Component Value Date/Time   CHOL 220 (H) 09/29/2020 1039   TRIG 168 (H) 09/29/2020 1039   HDL 58 09/29/2020 1039   CHOLHDL 3.8 09/29/2020 1039   LDLCALC 132 (H) 09/29/2020 1039    Hepatic Function Latest Ref Rng & Units 09/29/2020 03/28/2019 05/24/2018  Total Protein 6.0 - 8.5 g/dL 6.9 6.9 6.7  Albumin 3.8 - 4.8 g/dL 4.4 4.4 4.4  AST 0 - 40 IU/L 14 18 21   ALT 0 - 32 IU/L 13 11 12   Alk Phosphatase 44 - 121 IU/L 80 72 84  Total Bilirubin 0.0 - 1.2 mg/dL 0.3 0.3 0.2  Bilirubin, Direct 0.00 - 0.40 mg/dL <0.10 0.10 0.07    No results found for: TSH, FREET4  CBC Latest Ref Rng & Units 09/10/2018 05/24/2018 07/15/2017  WBC 4.0 - 10.5 K/uL 4.5 5.5 3.3(L)  Hemoglobin 12.0 - 15.0 g/dL 11.9(L) 12.3 12.5  Hematocrit 36.0 - 46.0 % 38.1 36.7 39.0  Platelets 150 - 400 K/uL 234 265 238    No results found for: VD25OH  Clinical ASCVD: No  The 10-year ASCVD risk score (Arnett DK, et al., 2019) is: 20.5%   Values used to calculate the score:     Age: 89 years     Sex: Female     Is  Non-Hispanic African American: No     Diabetic: Yes     Tobacco smoker: No     Systolic Blood Pressure: 735 mmHg     Is BP treated: Yes     HDL Cholesterol: 58 mg/dL     Total Cholesterol: 220 mg/dL    Social History   Tobacco Use  Smoking Status Never  Smokeless Tobacco Never   BP Readings from Last 3 Encounters:  01/12/21 128/84  12/22/20 (!) 143/85  10/08/20 134/84   Pulse Readings from Last 3 Encounters:  01/12/21 (!) 102  12/22/20 75  03/23/20 78   Wt Readings from Last 3 Encounters:  01/12/21 217 lb (98.4 kg)  12/22/20 218 lb (98.9 kg)  10/08/20 224 lb 12.8 oz (102 kg)     Assessment: Review of patient past medical history, allergies, medications, health status, including review of consultants reports, laboratory and other test data, was performed as part of comprehensive evaluation and provision of chronic care management services.   SDOH:  (Social Determinants of Health) assessments and interventions performed:    CCM Care Plan  Allergies  Allergen Reactions   Amoxicillin Nausea And Vomiting and Rash   Augmentin [Amoxicillin-Pot Clavulanate] Nausea And Vomiting        Ciprofloxacin Nausea And Vomiting   Codeine Nausea And Vomiting and Rash   Latex Rash    Pt states some tape ok   Mobic [Meloxicam] Other (See Comments)    Constipation   Sulfonamide Derivatives Nausea And Vomiting and Rash    Medications Reviewed Today     Reviewed by Beryle Lathe, [facility] (Pharmacist) on 03/26/21 at 1323  Med List Status: <None>   Medication Order Taking? Sig Documenting Provider Last Dose Status Informant  ALPRAZolam (XANAX) 0.5 MG tablet [id number] Yes TAKE 1/2- TO 1 TABLET BY MOUTH TWO TIMES A DAY AS NEEDED Kathyrn Drown, MD Taking Active            Med Note Waldo Laine, Gwenyth Allegra   Fri Mar 26, 2021  1:11 PM) Only takes about once a month  amoxicillin (AMOXIL) 500 MG capsule [id number] Yes Take 2,000 mg by mouth once as needed (Before dental procedures). [provider] Taking Active   diphenhydramine-acetaminophen (TYLENOL PM) 25-500 MG TABS tablet [id number] Yes Take 1 tablet by mouth at bedtime. [provider] Taking Active Self  escitalopram (LEXAPRO) 10 MG tablet [id number] Yes Take 1 tablet (10 mg total) by mouth daily. Kathyrn Drown, MD Taking Active   esomeprazole (NEXIUM) 20 MG capsule [id number] Yes Take 20 mg by mouth daily before breakfast.  [provider] Taking Active Self           Med Note Tami Lin Mar 16, 2020 12:36 PM)    hydrochlorothiazide (HYDRODIURIL) 25 MG tablet [id number] Yes Take 1  tablet (25 mg total) by mouth daily.  Patient taking differently: Take 1 tablet by mouth in the morning.   Kathyrn Drown, MD Taking Active   lisinopril (ZESTRIL) 40 MG tablet [id number] Yes Take 1 tablet (40 mg total) by mouth every evening. Kathyrn Drown, MD Taking Active   naproxen (NAPROSYN) 500 MG tablet [id number] Yes TAKE ONE TABLET TWICE DAILY WITH A MEAL AS NEEDED FOR PAIN Wolfgang Phoenix, Elayne Snare, MD Taking Active   traMADol (ULTRAM) 50 MG tablet [id number] Yes Take one tablet po TID prn  Patient taking differently: Take 50 mg by mouth 3 (three) times daily as needed.   Sallee Lange  A, MD Taking Active Self            Patient Active Problem List   Diagnosis Date Noted   Purpura senilis ([place]) 10/08/2020   Right rotator cuff tear 03/11/2020   Constipation 03/04/2020   Encounter for screening colonoscopy 03/04/2020   Arthritis of right acromioclavicular joint 01/21/2020   Bilateral shoulder bursitis 12/10/2019   Anxiousness 09/25/2019   Cystocele, midline 07/24/2018   Postmenopausal bleeding 07/24/2018   Vaginal discharge 07/24/2018   OA (osteoarthritis) of knee 10/21/2013   Hyperlipidemia 05/28/2013   Idiopathic urticaria 05/28/2013   GERD (gastroesophageal reflux disease) 11/28/2012   HTN (hypertension)    Acid reflux    Varicose vein of leg    Bursitis of knee 12/02/2011    Immunization History  Administered Date(s) Administered   Fluad Quad(high Dose 65+) 03/28/2019, 04/10/2020   Influenza-Unspecified 05/04/2018   Moderna SARS-COV2 Booster Vaccination 06/15/2020   Moderna Sars-Covid-2 Vaccination 08/16/2019, 09/13/2019   Pneumococcal Conjugate-13 11/27/2018   Pneumococcal Polysaccharide-23 04/10/2020   Zoster Recombinat (Shingrix) 03/22/2019, 04/04/2019, 06/04/2019    Conditions to be addressed/monitored: HTN, HLD, Anxiety, and osteoarthritis  Care Plan : Medication Management  Updates made by Beryle Lathe, [place] since 03/26/2021 12:00 AM     Problem:  HTN, HLD, OA, and Anxiety   Priority: High  Onset Date: 03/26/2021     Long-Range Goal: Disease Progression Prevention   Start Date: 03/26/2021  Expected End Date: 06/24/2021  This Visit's Progress: On track  Priority: High  Note:   Current Barriers:  Unable to achieve control of hyperlipidemia  Pharmacist Clinical Goal(s):  Over the next 90 days, patient will Achieve control of hyperlipidemia as evidenced by improved LDL and improved triglycerides through collaboration with PharmD and provider.   Interventions: 1:1 collaboration with Kathyrn Drown, MD regarding development and update of comprehensive plan of care as evidenced by provider attestation and co-signature Inter-disciplinary care team collaboration (see longitudinal plan of care) Comprehensive medication review performed; medication list updated in electronic medical record  Hypertension: Blood pressure under good control. Blood pressure is at goal of <130/80 mmHg per 2017 AHA/ACC guidelines. Current medications: lisinopril 40 mg by mouth every evening and hydrochlorothiazide 25 mg by mouth every morning Intolerances: none Taking medications as directed: yes Side effects thought to be attributed to current medication regimen: no Denies dizziness and lightheadedness. Current exercise: yard work Current home blood pressure: 120-130s/80s mmHg Continue lisinopril 40 mg by mouth every evening and hydrochlorothiazide 25 mg by mouth every morning Encourage dietary sodium restriction/DASH diet Recommend regular aerobic exercise Recommend home blood pressure monitoring to discuss at next visit  Hyperlipidemia: Uncontrolled. LDL above goal of <100 due to moderate risk given <2 major CV risk factors (hypertension) and 10-year risk <10% per 2020 AACE/ACE guidelines. Triglycerides above goal of <150 per 2020 AACE/ACE guidelines. Current medications:  none Intolerances: none Taking medications as directed: n/a Side effects  thought to be attributed to current medication regimen: n/a Encourage dietary reduction of high fat containing foods such as butter, nuts, bacon, egg yolks, etc. Recommend regular aerobic exercise Reviewed risks of hyperlipidemia, principles of treatment and consequences of untreated hyperlipidemia Patient has upcoming lipid panel in a few weeks. If LDL remains elevated above goal, will consider moderate intensity statin   Osteoarthritis: Mostly controlled per patient. Some days are worse than others Current medications: naproxen 500 mg by mouth twice daily and tramadol 50 mg by mouth three time daily as needed for pain Discussed that she could  also try Voltaren gel applied topically to pain areas (knees and hips) up to four times daily to help with pain. Voltaren is now available over the counter.   Anxiety: Controlled per patient Husband passed away a few months ago which has naturally been difficult for her to deal with. Her sister from [place] is currently staying with her until November which has helped. Offered chronic care management referral for social worker to discuss further but patient declines at this time but will consider when her sister leaves. Patient aware of resource if needed.  Continue escitalopram 10 mg by mouth daily and alprazolam 0.25-0.5 mg by mouth twice daily as needed for anxiety  Other: Patient reports taking Tylenol PM nearly every night for sleep. Given the anticholinergic properties of the diphenhydramine (Benadryl) in Tylenol PM, discussed that melatonin would be a safer option limit adverse events such as dry eyes, dry mouth, constipation, and cognitive impairment.   Patient Goals/Self-Care Activities Over the next 90 days, patient will:  Take medications as prescribed Check blood pressure at least once daily, document, and provide at future appointments Engage in dietary modifications by less frequent dining out and decreased fat intake Start walking or  exercising more frequently  Follow Up Plan: Telephone follow up appointment with care management team member scheduled for: 04/23/21      Medication Assistance: None required.  Patient affirms current coverage meets needs.  Patient's preferred pharmacy is:  [facility], [place] [address] Phone: [phone] Fax: [phone]  [facility], [place] [address] Phone: [phone] Fax: [place]  [place] [address] Phone: [phone] Fax: [phone]  Follow Up:  Patient agrees to Care Plan and Follow-up.  Plan: Telephone follow up appointment with care management team member scheduled for:  04/23/21  Kennon Holter, PharmD Clinical Pharmacist [place] [phone]

## 2021-04-02 DIAGNOSIS — M170 Bilateral primary osteoarthritis of knee: Secondary | ICD-10-CM | POA: Diagnosis not present

## 2021-04-02 DIAGNOSIS — E785 Hyperlipidemia, unspecified: Secondary | ICD-10-CM | POA: Diagnosis not present

## 2021-04-02 DIAGNOSIS — I10 Essential (primary) hypertension: Secondary | ICD-10-CM | POA: Diagnosis not present

## 2021-04-21 DIAGNOSIS — I10 Essential (primary) hypertension: Secondary | ICD-10-CM | POA: Diagnosis not present

## 2021-04-21 DIAGNOSIS — Z79899 Other long term (current) drug therapy: Secondary | ICD-10-CM | POA: Diagnosis not present

## 2021-04-21 DIAGNOSIS — E785 Hyperlipidemia, unspecified: Secondary | ICD-10-CM | POA: Diagnosis not present

## 2021-04-22 LAB — BASIC METABOLIC PANEL
BUN/Creatinine Ratio: 31 — ABNORMAL HIGH (ref 12–28)
BUN: 27 mg/dL (ref 8–27)
CO2: 25 mmol/L (ref 20–29)
Calcium: 9 mg/dL (ref 8.7–10.3)
Chloride: 102 mmol/L (ref 96–106)
Creatinine, Ser: 0.87 mg/dL (ref 0.57–1.00)
Glucose: 82 mg/dL (ref 70–99)
Potassium: 4.4 mmol/L (ref 3.5–5.2)
Sodium: 138 mmol/L (ref 134–144)
eGFR: 73 mL/min/{1.73_m2} (ref >59)

## 2021-04-22 LAB — LIPID PANEL
Chol/HDL Ratio: 3.9 ratio (ref 0.0–4.4)
Cholesterol, Total: 213 mg/dL — ABNORMAL HIGH (ref 100–199)
HDL: 55 mg/dL (ref >39)
LDL Chol Calc (NIH): 134 mg/dL — ABNORMAL HIGH (ref 0–99)
Triglycerides: 134 mg/dL (ref 0–149)
VLDL Cholesterol Cal: 24 mg/dL (ref 5–40)

## 2021-04-22 LAB — HEPATIC FUNCTION PANEL
ALT: 8 IU/L (ref 0–32)
AST: 15 IU/L (ref 0–40)
Albumin: 4.4 g/dL (ref 3.8–4.8)
Alkaline Phosphatase: 65 IU/L (ref 44–121)
Bilirubin Total: 0.3 mg/dL (ref 0.0–1.2)
Bilirubin, Direct: 0.11 mg/dL (ref 0.00–0.40)
Total Protein: 6.5 g/dL (ref 6.0–8.5)

## 2021-04-23 ENCOUNTER — Ambulatory Visit (INDEPENDENT_AMBULATORY_CARE_PROVIDER_SITE_OTHER): Payer: Medicare HMO | Admitting: Pharmacist

## 2021-04-23 DIAGNOSIS — M170 Bilateral primary osteoarthritis of knee: Secondary | ICD-10-CM

## 2021-04-23 DIAGNOSIS — I10 Essential (primary) hypertension: Secondary | ICD-10-CM

## 2021-04-23 DIAGNOSIS — F419 Anxiety disorder, unspecified: Secondary | ICD-10-CM

## 2021-04-23 DIAGNOSIS — E785 Hyperlipidemia, unspecified: Secondary | ICD-10-CM

## 2021-04-23 NOTE — Patient Instructions (Addendum)
Susan Oneill,  It was great to talk to you today! I will talk to Dr. Wolfgang Phoenix next week about starting you on a medication to lower your cholesterol. I will call you next Friday to give you an update.  Please call me with any questions or concerns.   Visit Information  PATIENT GOALS:  Goals Addressed             This Visit's Progress    Medication Management       Patient Goals/Self-Care Activities Over the next 90 days, patient will:  Take medications as prescribed Check blood pressure at least once daily, document, and provide at future appointments Engage in dietary modifications by less frequent dining out and decreased fat intake Start walking or exercising more frequently          Patient verbalizes understanding of instructions provided today and agrees to view in [place].   Telephone follow up appointment with care management team member scheduled for: 04/30/21  Kennon Holter, PharmD Clinical Pharmacist [place] [phone]

## 2021-04-23 NOTE — Chronic Care Management (AMB) (Signed)
Chronic Care Management Pharmacy Note  04/23/2021 Name:  Susan Oneill MRN:  [id number] DOB:  08/06/1950  Summary:  Hyperlipidemia: Repeat lipid panel with improved triglycerides but continued elevation of LDL. Will discuss with PCP. Recommend moderate intensity statin then repeat lipid panel again in 4-12 weeks to determine response.   Subjective: Susan Oneill is an 70 y.o. year old female who is a primary patient of Luking, Elayne Snare, MD.  The CCM team was consulted for assistance with disease management and care coordination needs.    Engaged with patient by telephone for follow up visit in response to provider referral for pharmacy case management and/or care coordination services.   Consent to Services:  The patient was given information about Chronic Care Management services, agreed to services, and gave verbal consent prior to initiation of services.  Please see initial visit note for detailed documentation.   Patient Care Team: Kathyrn Drown, MD as PCP - General (Family Medicine) Beryle Lathe, [facility] (Pharmacist)  Objective:  Lab Results  Component Value Date   CREATININE 0.87 04/21/2021   CREATININE 0.83 03/20/2020   CREATININE 0.82 03/28/2019    No results found for: HGBA1C Last diabetic Eye exam: No results found for: HMDIABEYEEXA  Last diabetic Foot exam: No results found for: HMDIABFOOTEX      Component Value Date/Time   CHOL 213 (H) 04/21/2021 0950   TRIG 134 04/21/2021 0950   HDL 55 04/21/2021 0950   CHOLHDL 3.9 04/21/2021 0950   LDLCALC 134 (H) 04/21/2021 0950    Hepatic Function Latest Ref Rng & Units 04/21/2021 09/29/2020 03/28/2019  Total Protein 6.0 - 8.5 g/dL 6.5 6.9 6.9  Albumin 3.8 - 4.8 g/dL 4.4 4.4 4.4  AST 0 - 40 IU/L _0 ALT 0 - 32 IU/L _1 Alk Phosphatase 44 - 121 IU/L 65 80 72  Total Bilirubin 0.0 - 1.2 mg/dL 0.3 0.3 0.3  Bilirubin, Direct 0.00 - 0.40 mg/dL 0.11 <0.10 0.10    No results found for: TSH,  FREET4  CBC Latest Ref Rng & Units 09/10/2018 05/24/2018 07/15/2017  WBC 4.0 - 10.5 K/uL 4.5 5.5 3.3(L)  Hemoglobin 12.0 - 15.0 g/dL 11.9(L) 12.3 12.5  Hematocrit 36.0 - 46.0 % 38.1 36.7 39.0  Platelets 150 - 400 K/uL 234 265 238    No results found for: VD25OH  Clinical ASCVD: No  The 10-year ASCVD risk score (Arnett DK, et al., 2019) is: 20.6%   Values used to calculate the score:     Age: 42 years     Sex: Female     Is Non-Hispanic African American: No     Diabetic: Yes     Tobacco smoker: No     Systolic Blood Pressure: 433 mmHg     Is BP treated: Yes     HDL Cholesterol: 55 mg/dL     Total Cholesterol: 213 mg/dL    Social History   Tobacco Use  Smoking Status Never  Smokeless Tobacco Never   BP Readings from Last 3 Encounters:  01/12/21 128/84  12/22/20 (!) 143/85  10/08/20 134/84   Pulse Readings from Last 3 Encounters:  01/12/21 (!) 102  12/22/20 75  03/23/20 78   Wt Readings from Last 3 Encounters:  01/12/21 217 lb (98.4 kg)  12/22/20 218 lb (98.9 kg)  10/08/20 224 lb 12.8 oz (102 kg)    Assessment: Review of patient past medical history, allergies, medications, health status, including review of  consultants reports, laboratory and other test data, was performed as part of comprehensive evaluation and provision of chronic care management services.   SDOH:  (Social Determinants of Health) assessments and interventions performed:    CCM Care Plan  Allergies  Allergen Reactions   Amoxicillin Nausea And Vomiting and Rash   Augmentin [Amoxicillin-Pot Clavulanate] Nausea And Vomiting        Ciprofloxacin Nausea And Vomiting   Codeine Nausea And Vomiting and Rash   Latex Rash    Pt states some tape ok   Mobic [Meloxicam] Other (See Comments)    Constipation   Sulfonamide Derivatives Nausea And Vomiting and Rash    Medications Reviewed Today     Reviewed by Beryle Lathe, [facility] (Pharmacist) on 04/23/21 at 1316  Med List Status: <None>    Medication Order Taking? Sig Documenting Provider Last Dose Status Informant  ALPRAZolam (XANAX) 0.5 MG tablet [id number] Yes TAKE 1/2- TO 1 TABLET BY MOUTH TWO TIMES A DAY AS NEEDED Kathyrn Drown, MD Taking Active            Med Note [place] Laine, Gwenyth Allegra   Fri Mar 26, 2021  1:11 PM) Only takes about once a month  amoxicillin (AMOXIL) 500 MG capsule [id number] Yes Take 2,000 mg by mouth once as needed (Before dental procedures). [provider] Taking Active   escitalopram (LEXAPRO) 10 MG tablet [id number] Yes Take 1 tablet (10 mg total) by mouth daily. Kathyrn Drown, MD Taking Active   esomeprazole (NEXIUM) 20 MG capsule [id number] Yes Take 20 mg by mouth daily before breakfast.  [provider] Taking Active Self           Med Note Tami Lin Mar 16, 2020 12:36 PM)    hydrochlorothiazide (HYDRODIURIL) 25 MG tablet [id number] Yes Take 1 tablet (25 mg total) by mouth daily.  Patient taking differently: Take 1 tablet by mouth in the morning.   Kathyrn Drown, MD Taking Active   lisinopril (ZESTRIL) 40 MG tablet [id number] Yes Take 1 tablet (40 mg total) by mouth every evening. Kathyrn Drown, MD Taking Active   naproxen (NAPROSYN) 500 MG tablet [id number] Yes TAKE ONE TABLET TWICE DAILY WITH A MEAL AS NEEDED FOR PAIN Wolfgang Phoenix, Elayne Snare, MD Taking Active   traMADol (ULTRAM) 50 MG tablet [id number] Yes Take one tablet po TID prn  Patient taking differently: Take 50 mg by mouth 3 (three) times daily as needed.   Kathyrn Drown, MD Taking Active Self            Patient Active Problem List   Diagnosis Date Noted   Purpura senilis ([place]) 10/08/2020   Right rotator cuff tear 03/11/2020   Constipation 03/04/2020   Encounter for screening colonoscopy 03/04/2020   Arthritis of right acromioclavicular joint 01/21/2020   Bilateral shoulder bursitis 12/10/2019   Anxiousness 09/25/2019   Cystocele, midline 07/24/2018   Postmenopausal bleeding 07/24/2018   Vaginal  discharge 07/24/2018   OA (osteoarthritis) of knee 10/21/2013   Hyperlipidemia 05/28/2013   Idiopathic urticaria 05/28/2013   GERD (gastroesophageal reflux disease) 11/28/2012   HTN (hypertension)    Acid reflux    Varicose vein of leg    Bursitis of knee 12/02/2011    Immunization History  Administered Date(s) Administered   Fluad Quad(high Dose 65+) 03/28/2019, 04/10/2020   Influenza-Unspecified 05/04/2018   Moderna SARS-COV2 Booster Vaccination 06/15/2020   Moderna Sars-Covid-2 Vaccination 08/16/2019, 09/13/2019   Pneumococcal Conjugate-13 11/27/2018   Pneumococcal  Polysaccharide-23 04/10/2020   Zoster Recombinat (Shingrix) 03/22/2019, 04/04/2019, 06/04/2019    Conditions to be addressed/monitored: HTN, HLD, Anxiety, and Osteoarthritis  Care Plan : Medication Management  Updates made by Beryle Lathe, [place] since 04/23/2021 12:00 AM     Problem: HTN, HLD, OA, and Anxiety   Priority: High  Onset Date: 03/26/2021     Long-Range Goal: Disease Progression Prevention   Start Date: 03/26/2021  Expected End Date: 06/24/2021  Recent Progress: On track  Priority: High  Note:   Current Barriers:  Unable to achieve control of hyperlipidemia  Pharmacist Clinical Goal(s):  Over the next 90 days, patient will Achieve control of hyperlipidemia as evidenced by improved LDL and improved triglycerides through collaboration with PharmD and provider.   Interventions: 1:1 collaboration with Kathyrn Drown, MD regarding development and update of comprehensive plan of care as evidenced by provider attestation and co-signature Inter-disciplinary care team collaboration (see longitudinal plan of care) Comprehensive medication review performed; medication list updated in electronic medical record  Hypertension: Blood pressure under good control. Blood pressure is at goal of <130/80 mmHg per 2017 AHA/ACC guidelines. Current medications: lisinopril 40 mg by mouth every evening and  hydrochlorothiazide 25 mg by mouth every morning Intolerances: none Taking medications as directed: yes Side effects thought to be attributed to current medication regimen: no Denies dizziness and lightheadedness. Current exercise: yard work Current home blood pressure: 120-130s/70-80s mmHg Continue lisinopril 40 mg by mouth every evening and hydrochlorothiazide 25 mg by mouth every morning Encourage dietary sodium restriction/DASH diet Recommend regular aerobic exercise Recommend home blood pressure monitoring to discuss at next visit  Hyperlipidemia: Uncontrolled. LDL above goal of <100 due to moderate risk given <2 major CV risk factors (hypertension) and 10-year risk <10% per 2020 AACE/ACE guidelines. Triglycerides at goal of <150 per 2020 AACE/ACE guidelines. Current medications:  none Intolerances: none Taking medications as directed: n/a Side effects thought to be attributed to current medication regimen: n/a Encourage dietary reduction of high fat containing foods such as butter, nuts, bacon, egg yolks, etc. Recommend regular aerobic exercise Reviewed risks of hyperlipidemia, principles of treatment and consequences of untreated hyperlipidemia Repeat lipid panel with improved triglycerides but continued elevation of LDL. Will discuss with PCP. Recommend moderate intensity statin then repeat lipid panel again in 4-12 weeks to determine response.   Osteoarthritis: Mostly controlled per patient. Some days are worse than others Current medications: naproxen 500 mg by mouth twice daily, tramadol 50 mg by mouth three time daily as needed for pain, and Voltaren gel topically (knees and hips) up to four times daily.  Anxiety: Controlled per patient Husband passed away a few months ago which has naturally been difficult for her to deal with. Her sister from [place] is currently staying with her until November which has helped. Offered chronic care management referral for social worker to  discuss further but patient declines at this time but will consider when her sister leaves. Patient aware of resource if needed.  Continue escitalopram 10 mg by mouth daily and alprazolam 0.25-0.5 mg by mouth twice daily as needed for anxiety  Other: Patient reports taking Tylenol PM nearly every night for sleep. Given the anticholinergic properties of the diphenhydramine (Benadryl) in Tylenol PM, discussed that melatonin would be a safer option limit adverse events such as dry eyes, dry mouth, constipation, and cognitive impairment. Patient reports still using Tylenol PM on 04/23/21 as she wanted to finish out her current bottle before switching over to melatonin.  Patient Goals/Self-Care  Activities Over the next 90 days, patient will:  Take medications as prescribed Check blood pressure at least once daily, document, and provide at future appointments Engage in dietary modifications by less frequent dining out and decreased fat intake Start walking or exercising more frequently  Follow Up Plan: Telephone follow up appointment with care management team member scheduled for: 04/30/21      Medication Assistance: None required.  Patient affirms current coverage meets needs.  Patient's preferred pharmacy is:  [facility], [place] [address] Phone: [phone] Fax: [phone]  [place], [place] [address] Phone: [phone] Fax: [place]  [place] [address] Phone: [phone] Fax: [phone]  Follow Up:  Patient agrees to Care Plan and Follow-up.  Plan: Telephone follow up appointment with care management team member scheduled for:  04/30/21  Kennon Holter, PharmD Clinical Pharmacist [place] [phone]

## 2021-04-26 ENCOUNTER — Encounter: Payer: Self-pay | Admitting: Family Medicine

## 2021-04-30 ENCOUNTER — Ambulatory Visit: Payer: Medicare HMO | Admitting: Pharmacist

## 2021-04-30 DIAGNOSIS — I10 Essential (primary) hypertension: Secondary | ICD-10-CM

## 2021-04-30 DIAGNOSIS — E785 Hyperlipidemia, unspecified: Secondary | ICD-10-CM

## 2021-04-30 DIAGNOSIS — M170 Bilateral primary osteoarthritis of knee: Secondary | ICD-10-CM

## 2021-04-30 DIAGNOSIS — F419 Anxiety disorder, unspecified: Secondary | ICD-10-CM

## 2021-04-30 MED ORDER — ROSUVASTATIN CALCIUM 5 MG PO TABS: 5.0000 mg | ORAL_TABLET | Freq: Every day | ORAL | 3 refills | Status: DC

## 2021-04-30 NOTE — Patient Instructions (Signed)
Susan Oneill,  It was great to talk to you today! Please start taking rosuvastatin (Crestor) 5 mg by mouth daily to lower your cholesterol and reduce your risk for cardiovascular disease.   Please call me with any questions or concerns.   Visit Information  PATIENT GOALS:  Goals Addressed             This Visit's Progress    Medication Management       Patient Goals/Self-Care Activities Over the next 90 days, patient will:  Take medications as prescribed Check blood pressure at least once daily, document, and provide at future appointments Engage in dietary modifications by less frequent dining out and decreased fat intake Start walking or exercising more frequently           Patient verbalizes understanding of instructions provided today and agrees to view in Early.   Face to Face appointment with care management team member scheduled for: 07/02/21  Kennon Holter, PharmD Clinical Pharmacist [place] [phone]

## 2021-04-30 NOTE — Chronic Care Management (AMB) (Signed)
Chronic Care Management Pharmacy Note  04/30/2021 Name:  Susan Oneill MRN:  [id number] DOB:  July 13, 1950  Summary:  Repeat lipid panel with improved triglycerides but continued elevation of LDL. Discussed with PCP today and will plan to start rosuvastatin 5 mg by mouth daily then repeat lipid panel again in 4-12 weeks to determine response. Can also check liver function tests at follow-up visit with PCP in 2 months.  Subjective: Susan Oneill is an 70 y.o. year old female who is a primary patient of Luking, Elayne Snare, MD.  The CCM team was consulted for assistance with disease management and care coordination needs.    Engaged with patient by telephone for follow up visit in response to provider referral for pharmacy case management and/or care coordination services.   Consent to Services:  The patient was given information about Chronic Care Management services, agreed to services, and gave verbal consent prior to initiation of services.  Please see initial visit note for detailed documentation.   Patient Care Team: Kathyrn Drown, MD as PCP - General (Family Medicine) Beryle Lathe, [facility] (Pharmacist)  Objective:  Lab Results  Component Value Date   CREATININE 0.87 04/21/2021   CREATININE 0.83 03/20/2020   CREATININE 0.82 03/28/2019    No results found for: HGBA1C Last diabetic Eye exam: No results found for: HMDIABEYEEXA  Last diabetic Foot exam: No results found for: HMDIABFOOTEX      Component Value Date/Time   CHOL 213 (H) 04/21/2021 0950   TRIG 134 04/21/2021 0950   HDL 55 04/21/2021 0950   CHOLHDL 3.9 04/21/2021 0950   LDLCALC 134 (H) 04/21/2021 0950    Hepatic Function Latest Ref Rng & Units 04/21/2021 09/29/2020 03/28/2019  Total Protein 6.0 - 8.5 g/dL 6.5 6.9 6.9  Albumin 3.8 - 4.8 g/dL 4.4 4.4 4.4  AST 0 - 40 IU/L _0 ALT 0 - 32 IU/L _1 Alk Phosphatase 44 - 121 IU/L 65 80 72  Total Bilirubin 0.0 - 1.2 mg/dL 0.3 0.3 0.3   Bilirubin, Direct 0.00 - 0.40 mg/dL 0.11 <0.10 0.10    No results found for: TSH, FREET4  CBC Latest Ref Rng & Units 09/10/2018 05/24/2018 07/15/2017  WBC 4.0 - 10.5 K/uL 4.5 5.5 3.3(L)  Hemoglobin 12.0 - 15.0 g/dL 11.9(L) 12.3 12.5  Hematocrit 36.0 - 46.0 % 38.1 36.7 39.0  Platelets 150 - 400 K/uL 234 265 238    No results found for: VD25OH  Clinical ASCVD: No  The 10-year ASCVD risk score (Arnett DK, et al., 2019) is: 17.5%   Values used to calculate the score:     Age: 70 years     Sex: Female     Is Non-Hispanic African American: No     Diabetic: Yes     Tobacco smoker: No     Systolic Blood Pressure: 537 mmHg     Is BP treated: Yes     HDL Cholesterol: 55 mg/dL     Total Cholesterol: 213 mg/dL    Social History   Tobacco Use  Smoking Status Never  Smokeless Tobacco Never   BP Readings from Last 3 Encounters:  01/12/21 128/84  12/22/20 (!) 143/85  10/08/20 134/84   Pulse Readings from Last 3 Encounters:  01/12/21 (!) 102  12/22/20 75  03/23/20 78   Wt Readings from Last 3 Encounters:  01/12/21 217 lb (98.4 kg)  12/22/20 218 lb (98.9 kg)  10/08/20 224 lb 12.8 oz (  102 kg)    Assessment: Review of patient past medical history, allergies, medications, health status, including review of consultants reports, laboratory and other test data, was performed as part of comprehensive evaluation and provision of chronic care management services.   SDOH:  (Social Determinants of Health) assessments and interventions performed:    CCM Care Plan  Allergies  Allergen Reactions   Amoxicillin Nausea And Vomiting and Rash   Augmentin [Amoxicillin-Pot Clavulanate] Nausea And Vomiting        Ciprofloxacin Nausea And Vomiting   Codeine Nausea And Vomiting and Rash   Latex Rash    Pt states some tape ok   Mobic [Meloxicam] Other (See Comments)    Constipation   Sulfonamide Derivatives Nausea And Vomiting and Rash    Medications Reviewed Today     Reviewed by  Walston, Christopher J, RPH (Pharmacist) on 04/30/21 at 1329  Med List Status: <None>   Medication Order Taking? Sig Documenting Provider Last Dose Status Informant  ALPRAZolam (XANAX) 0.5 MG tablet [id number] Yes TAKE 1/2- TO 1 TABLET BY MOUTH TWO TIMES A DAY AS NEEDED Luking, Scott A, MD Taking Active            Med Note (WALSTON, CHRISTOPHER J   Fri Mar 26, 2021  1:11 PM) Only takes about once a month  amoxicillin (AMOXIL) 500 MG capsule [id number] Yes Take 2,000 mg by mouth once as needed (Before dental procedures). [provider] Taking Active   escitalopram (LEXAPRO) 10 MG tablet [id number] Yes Take 1 tablet (10 mg total) by mouth daily. Luking, Scott A, MD Taking Active   esomeprazole (NEXIUM) 20 MG capsule [id number] Yes Take 20 mg by mouth daily before breakfast.  [provider] Taking Active Self           Med Note (HARRIS, YOLINDA T   Mon Mar 16, 2020 12:36 PM)    hydrochlorothiazide (HYDRODIURIL) 25 MG tablet [id number] Yes Take 1 tablet (25 mg total) by mouth daily.  Patient taking differently: Take 1 tablet by mouth in the morning.   Luking, Scott A, MD Taking Active   lisinopril (ZESTRIL) 40 MG tablet [id number] Yes Take 1 tablet (40 mg total) by mouth every evening. Luking, Scott A, MD Taking Active   naproxen (NAPROSYN) 500 MG tablet [id number] Yes TAKE ONE TABLET TWICE DAILY WITH A MEAL AS NEEDED FOR PAIN Luking, Scott A, MD Taking Active   rosuvastatin (CRESTOR) 5 MG tablet [id number]  Take 1 tablet (5 mg total) by mouth daily. Luking, Scott A, MD  Active   traMADol (ULTRAM) 50 MG tablet [id number] Yes Take one tablet po TID prn  Patient taking differently: Take 50 mg by mouth 3 (three) times daily as needed.   Luking, Scott A, MD Taking Active Self            Patient Active Problem List   Diagnosis Date Noted   Purpura senilis ([facility]) 10/08/2020   Right rotator cuff tear 03/11/2020   Constipation 03/04/2020   Encounter for screening colonoscopy 03/04/2020    Arthritis of right acromioclavicular joint 01/21/2020   Bilateral shoulder bursitis 12/10/2019   Anxiousness 09/25/2019   Cystocele, midline 07/24/2018   Postmenopausal bleeding 07/24/2018   Vaginal discharge 07/24/2018   OA (osteoarthritis) of knee 10/21/2013   Hyperlipidemia 05/28/2013   Idiopathic urticaria 05/28/2013   GERD (gastroesophageal reflux disease) 11/28/2012   HTN (hypertension)    Acid reflux    Varicose vein of leg    Bursitis of knee 12/02/2011      Immunization History  Administered Date(s) Administered   Fluad Quad(high Dose 65+) 03/28/2019, 04/10/2020   Influenza-Unspecified 05/04/2018   Moderna SARS-COV2 Booster Vaccination 06/15/2020   Moderna Sars-Covid-2 Vaccination 08/16/2019, 09/13/2019   Pneumococcal Conjugate-13 11/27/2018   Pneumococcal Polysaccharide-23 04/10/2020   Zoster Recombinat (Shingrix) 03/22/2019, 04/04/2019, 06/04/2019    Conditions to be addressed/monitored: HTN, HLD, Anxiety, and Osteoarthritis  Care Plan : Medication Management  Updates made by Walston, Christopher J, RPH since 04/30/2021 12:00 AM     Problem: HTN, HLD, OA, and Anxiety   Priority: High  Onset Date: 03/26/2021     Long-Range Goal: Disease Progression Prevention   Start Date: 03/26/2021  Expected End Date: 06/24/2021  Recent Progress: On track  Priority: High  Note:   Current Barriers:  Unable to achieve control of hyperlipidemia  Pharmacist Clinical Goal(s):  Over the next 90 days, patient will Achieve control of hyperlipidemia as evidenced by improved LDL and improved triglycerides through collaboration with PharmD and provider.   Interventions: 1:1 collaboration with Luking, Scott A, MD regarding development and update of comprehensive plan of care as evidenced by provider attestation and co-signature Inter-disciplinary care team collaboration (see longitudinal plan of care) Comprehensive medication review performed; medication list updated in electronic  medical record  Hypertension: Blood pressure under good control. Blood pressure is at goal of <130/80 mmHg per 2017 AHA/ACC guidelines. Current medications: lisinopril 40 mg by mouth every evening and hydrochlorothiazide 25 mg by mouth every morning Intolerances: none Taking medications as directed: yes Side effects thought to be attributed to current medication regimen: no Denies dizziness and lightheadedness. Current exercise: yard work Current home blood pressure: 120-130s/70-80s mmHg Continue lisinopril 40 mg by mouth every evening and hydrochlorothiazide 25 mg by mouth every morning Encourage dietary sodium restriction/DASH diet Recommend regular aerobic exercise Recommend home blood pressure monitoring to discuss at next visit  Hyperlipidemia: Uncontrolled. LDL above goal of <100 due to moderate risk given <2 major CV risk factors (hypertension) and 10-year risk <10% per 2020 AACE/ACE guidelines. Triglycerides at goal of <150 per 2020 AACE/ACE guidelines. Current medications:  none Intolerances: none Taking medications as directed: n/a Side effects thought to be attributed to current medication regimen: n/a Encourage dietary reduction of high fat containing foods such as butter, nuts, bacon, egg yolks, etc. Recommend regular aerobic exercise Reviewed risks of hyperlipidemia, principles of treatment and consequences of untreated hyperlipidemia Repeat lipid panel with improved triglycerides but continued elevation of LDL. Discussed with PCP today and will plan to start rosuvastatin 5 mg by mouth daily then repeat lipid panel again in 4-12 weeks to determine response.   Osteoarthritis: Mostly controlled per patient. Some days are worse than others Current medications: naproxen 500 mg by mouth twice daily, tramadol 50 mg by mouth three time daily as needed for pain, and Voltaren gel topically (knees and hips) up to four times daily.  Anxiety: Controlled per patient Husband passed  away a few months ago which has naturally been difficult for her to deal with. Her sister from [place] is currently staying with her until November which has helped. Offered chronic care management referral for social worker to discuss further but patient declines at this time but will consider when her sister leaves. Patient aware of resource if needed.  Continue escitalopram 10 mg by mouth daily and alprazolam 0.25-0.5 mg by mouth twice daily as needed for anxiety  Other: Patient reports taking Tylenol PM nearly every night for sleep. Given the anticholinergic properties of the diphenhydramine (Benadryl) in Tylenol   PM, discussed that melatonin would be a safer option limit adverse events such as dry eyes, dry mouth, constipation, and cognitive impairment. Patient reports still using Tylenol PM on 04/23/21 as she wanted to finish out her current bottle before switching over to melatonin.  Patient Goals/Self-Care Activities Over the next 90 days, patient will:  Take medications as prescribed Check blood pressure at least once daily, document, and provide at future appointments Engage in dietary modifications by less frequent dining out and decreased fat intake Start walking or exercising more frequently  Follow Up Plan: Face to Face appointment with care management team member scheduled for: 07/02/21      Medication Assistance: None required.  Patient affirms current coverage meets needs.  Patient's preferred pharmacy is:  [place], [place] [address] Phone: [phone] Fax: [phone]  [place], [place] [address] Phone: [phone] Fax: [place]  [place] [address] Phone: [phone] Fax: [phone]  Follow Up:  Patient agrees to Care Plan and  Follow-up.  Plan: Face to Face appointment with care management team member scheduled for: 07/02/21  Kennon Holter, PharmD Clinical Pharmacist [place] [phone]

## 2021-05-03 DIAGNOSIS — M170 Bilateral primary osteoarthritis of knee: Secondary | ICD-10-CM

## 2021-05-03 DIAGNOSIS — E785 Hyperlipidemia, unspecified: Secondary | ICD-10-CM

## 2021-05-03 DIAGNOSIS — I10 Essential (primary) hypertension: Secondary | ICD-10-CM | POA: Diagnosis not present

## 2021-05-18 ENCOUNTER — Other Ambulatory Visit: Payer: Self-pay | Admitting: Family Medicine

## 2021-05-20 ENCOUNTER — Other Ambulatory Visit: Payer: Self-pay | Admitting: Family Medicine

## 2021-06-09 ENCOUNTER — Other Ambulatory Visit: Payer: Self-pay | Admitting: Family Medicine

## 2021-06-29 ENCOUNTER — Other Ambulatory Visit: Payer: Self-pay | Admitting: Family Medicine

## 2021-06-29 ENCOUNTER — Telehealth: Payer: Self-pay | Admitting: Family Medicine

## 2021-06-29 NOTE — Telephone Encounter (Signed)
In the past the patient got these through her local pharmacy.  Please verify with patient where she wants to have these filled.

## 2021-06-29 NOTE — Telephone Encounter (Signed)
[facility] [facility] requesting refill on Tramadol 50 mg. Take one tablet po TID PRN. Pt last seen 03/03/21 for depression. Please advise. Thank you.

## 2021-06-30 ENCOUNTER — Telehealth: Payer: Self-pay | Admitting: Family Medicine

## 2021-06-30 ENCOUNTER — Other Ambulatory Visit: Payer: Self-pay | Admitting: Family Medicine

## 2021-06-30 MED ORDER — TRAMADOL HCL 50 MG PO TABS: ORAL_TABLET | ORAL | 5 refills | Status: DC

## 2021-06-30 MED ORDER — ESCITALOPRAM OXALATE 10 MG PO TABS: 10.0000 mg | ORAL_TABLET | Freq: Every day | ORAL | 1 refills | Status: DC

## 2021-06-30 NOTE — Telephone Encounter (Signed)
90-day with a refill sent to [facility]

## 2021-06-30 NOTE — Telephone Encounter (Signed)
Patient notified

## 2021-06-30 NOTE — Telephone Encounter (Signed)
[facility]

## 2021-06-30 NOTE — Telephone Encounter (Signed)
Pt contacted. Pt states she would like Tramadol sent to [facility] due to no co pay. Please advise. Thank you

## 2021-06-30 NOTE — Telephone Encounter (Signed)
[facility] requesting refill on Escitalopram 10 mg tablet-take one tablet po daily. Please advise. Thank you

## 2021-07-02 ENCOUNTER — Ambulatory Visit: Payer: Medicare HMO

## 2021-07-02 ENCOUNTER — Ambulatory Visit: Payer: Medicare HMO | Admitting: Family Medicine

## 2021-07-06 ENCOUNTER — Other Ambulatory Visit: Payer: Self-pay

## 2021-07-06 ENCOUNTER — Ambulatory Visit (INDEPENDENT_AMBULATORY_CARE_PROVIDER_SITE_OTHER): Payer: Medicare HMO | Admitting: Family Medicine

## 2021-07-06 VITALS — BP 135/84 | HR 77 | Temp 97.8°F | Ht 65.0 in | Wt 218.6 lb

## 2021-07-06 DIAGNOSIS — M170 Bilateral primary osteoarthritis of knee: Secondary | ICD-10-CM | POA: Diagnosis not present

## 2021-07-06 DIAGNOSIS — E785 Hyperlipidemia, unspecified: Secondary | ICD-10-CM

## 2021-07-06 DIAGNOSIS — I10 Essential (primary) hypertension: Secondary | ICD-10-CM

## 2021-07-06 DIAGNOSIS — F325 Major depressive disorder, single episode, in full remission: Secondary | ICD-10-CM

## 2021-07-06 NOTE — Patient Instructions (Addendum)
Please do your blood work-cholesterol recheck around January 20.  We will see you in approximately 5 to 6 months.  TakeCare-Dr. Nicki Reaper  DASH Eating Plan DASH stands for Dietary Approaches to Stop Hypertension. The DASH eating plan is a healthy eating plan that has been shown to: Reduce high blood pressure (hypertension). Reduce your risk for type 2 diabetes, heart disease, and stroke. Help with weight loss. What are tips for following this plan? Reading food labels Check food labels for the amount of salt (sodium) per serving. Choose foods with less than 5 percent of the Daily Value of sodium. Generally, foods with less than 300 milligrams (mg) of sodium per serving fit into this eating plan. To find whole grains, look for the word "whole" as the first word in the ingredient list. Shopping Buy products labeled as "low-sodium" or "no salt added." Buy fresh foods. Avoid canned foods and pre-made or frozen meals. Cooking Avoid adding salt when cooking. Use salt-free seasonings or herbs instead of table salt or sea salt. Check with your health care provider or pharmacist before using salt substitutes. Do not fry foods. Cook foods using healthy methods such as baking, boiling, grilling, roasting, and broiling instead. Cook with heart-healthy oils, such as olive, canola, avocado, soybean, or sunflower oil. Meal planning  Eat a balanced diet that includes: 4 or more servings of fruits and 4 or more servings of vegetables each day. Try to fill one-half of your plate with fruits and vegetables. 6-8 servings of whole grains each day. Less than 6 oz (170 g) of lean meat, poultry, or fish each day. A 3-oz (85-g) serving of meat is about the same size as a deck of cards. One egg equals 1 oz (28 g). 2-3 servings of low-fat dairy each day. One serving is 1 cup (237 mL). 1 serving of nuts, seeds, or beans 5 times each week. 2-3 servings of heart-healthy fats. Healthy fats called omega-3 fatty acids are  found in foods such as walnuts, flaxseeds, fortified milks, and eggs. These fats are also found in cold-water fish, such as sardines, salmon, and mackerel. Limit how much you eat of: Canned or prepackaged foods. Food that is high in trans fat, such as some fried foods. Food that is high in saturated fat, such as fatty meat. Desserts and other sweets, sugary drinks, and other foods with added sugar. Full-fat dairy products. Do not salt foods before eating. Do not eat more than 4 egg yolks a week. Try to eat at least 2 vegetarian meals a week. Eat more home-cooked food and less restaurant, buffet, and fast food. Lifestyle When eating at a restaurant, ask that your food be prepared with less salt or no salt, if possible. If you drink alcohol: Limit how much you use to: 0-1 drink a day for women who are not pregnant. 0-2 drinks a day for men. Be aware of how much alcohol is in your drink. In the [place] one drink equals one 12 oz bottle of beer (355 mL), one 5 oz glass of wine (148 mL), or one 1 oz glass of hard liquor (44 mL). General information Avoid eating more than 2,300 mg of salt a day. If you have hypertension, you may need to reduce your sodium intake to 1,500 mg a day. Work with your health care provider to maintain a healthy body weight or to lose weight. Ask what an ideal weight is for you. Get at least 30 minutes of exercise that causes your heart to beat  faster (aerobic exercise) most days of the week. Activities may include walking, swimming, or biking. Work with your health care provider or dietitian to adjust your eating plan to your individual calorie needs. What foods should I eat? Fruits All fresh, dried, or frozen fruit. Canned fruit in natural juice (without added sugar). Vegetables Fresh or frozen vegetables (raw, steamed, roasted, or grilled). Low-sodium or reduced-sodium tomato and vegetable juice. Low-sodium or reduced-sodium tomato sauce and tomato paste. Low-sodium  or reduced-sodium canned vegetables. Grains Whole-grain or whole-wheat bread. Whole-grain or whole-wheat pasta. Brown rice. [place]. Bulgur. Whole-grain and low-sodium cereals. Pita bread. Low-fat, low-sodium crackers. Whole-wheat flour tortillas. Meats and other proteins Skinless chicken or [place]. Ground chicken or [place]. Pork with fat trimmed off. Fish and seafood. Egg whites. Dried beans, peas, or lentils. Unsalted nuts, nut butters, and seeds. Unsalted canned beans. Lean cuts of beef with fat trimmed off. Low-sodium, lean precooked or cured meat, such as sausages or meat loaves. Dairy Low-fat (1%) or fat-free (skim) milk. Reduced-fat, low-fat, or fat-free cheeses. Nonfat, low-sodium ricotta or cottage cheese. Low-fat or nonfat yogurt. Low-fat, low-sodium cheese. Fats and oils Soft margarine without trans fats. Vegetable oil. Reduced-fat, low-fat, or light mayonnaise and salad dressings (reduced-sodium). Canola, safflower, olive, avocado, soybean, and sunflower oils. Avocado. Seasonings and condiments Herbs. Spices. Seasoning mixes without salt. Other foods Unsalted popcorn and pretzels. Fat-free sweets. The items listed above may not be a complete list of foods and beverages you can eat. Contact a dietitian for more information. What foods should I avoid? Fruits Canned fruit in a light or heavy syrup. Fried fruit. Fruit in cream or butter sauce. Vegetables Creamed or fried vegetables. Vegetables in a cheese sauce. Regular canned vegetables (not low-sodium or reduced-sodium). Regular canned tomato sauce and paste (not low-sodium or reduced-sodium). Regular tomato and vegetable juice (not low-sodium or reduced-sodium). Angie Fava. Olives. Grains Baked goods made with fat, such as croissants, muffins, or some breads. Dry pasta or rice meal packs. Meats and other proteins Fatty cuts of meat. Ribs. Fried meat. Berniece Salines. Bologna, salami, and other precooked or cured meats, such as sausages or  meat loaves. Fat from the back of a pig (fatback). Bratwurst. Salted nuts and seeds. Canned beans with added salt. Canned or smoked fish. Whole eggs or egg yolks. Chicken or [place] with skin. Dairy Whole or 2% milk, cream, and half-and-half. Whole or full-fat cream cheese. Whole-fat or sweetened yogurt. Full-fat cheese. Nondairy creamers. Whipped toppings. Processed cheese and cheese spreads. Fats and oils Butter. Stick margarine. Lard. Shortening. Ghee. Bacon fat. Tropical oils, such as coconut, palm kernel, or palm oil. Seasonings and condiments Onion salt, garlic salt, seasoned salt, table salt, and sea salt. Worcestershire sauce. Tartar sauce. Barbecue sauce. Teriyaki sauce. Soy sauce, including reduced-sodium. Steak sauce. Canned and packaged gravies. Fish sauce. Oyster sauce. Cocktail sauce. Store-bought horseradish. Ketchup. Mustard. Meat flavorings and tenderizers. Bouillon cubes. Hot sauces. Pre-made or packaged marinades. Pre-made or packaged taco seasonings. Relishes. Regular salad dressings. Other foods Salted popcorn and pretzels. The items listed above may not be a complete list of foods and beverages you should avoid. Contact a dietitian for more information. Where to find more information National Heart, Lung, and Blood Institute: https://wilson-eaton.com/ American Heart Association: www.heart.org Academy of Nutrition and Dietetics: www.eatright.[place]: www.kidney.org Summary The DASH eating plan is a healthy eating plan that has been shown to reduce high blood pressure (hypertension). It may also reduce your risk for type 2 diabetes, heart disease, and stroke. When on  the DASH eating plan, aim to eat more fresh fruits and vegetables, whole grains, lean proteins, low-fat dairy, and heart-healthy fats. With the DASH eating plan, you should limit salt (sodium) intake to 2,300 mg a day. If you have hypertension, you may need to reduce your sodium intake to 1,500 mg a  day. Work with your health care provider or dietitian to adjust your eating plan to your individual calorie needs. This information is not intended to replace advice given to you by your health care provider. Make sure you discuss any questions you have with your health care provider. Document Revised: 05/24/2019 Document Reviewed: 05/24/2019 Elsevier Patient Education  2022 [employer].

## 2021-07-06 NOTE — Progress Notes (Signed)
° °  Subjective:    Patient ID: Susan Oneill, female    DOB: February 16, 1951, 71 y.o.   MRN: [id number]  HPI Follow up on medications Patient working her way through depression with the loss of her husband She is starting to improve in regards to this Trying to find herself She does take her cholesterol medicine regular basis tries watch diet to some degree but admits she snacks too much Takes her blood pressure medicine regular basis denies any setbacks or problems  Uses Xanax rarely She does state at times she sleeps too hard at night has a hard time waking up for her phone alerts I encouraged her to cut back on tramadol  Review of Systems     Objective:   Physical Exam  General-in no acute distress Eyes-no discharge Lungs-respiratory rate normal, CTA CV-no murmurs,RRR Extremities skin warm dry no edema Neuro grossly normal Behavior normal, alert       Assessment & Plan:  1. Primary hypertension Blood pressure good control continue current measures - Lipid Profile - Hepatic function panel  2. Osteoarthritis of both knees, unspecified osteoarthritis type May use tramadol but I encourage her to stick with 1 or 2/day and try to avoid 3/day if at all possible  3. Hyperlipidemia, unspecified hyperlipidemia type Continue statin she is tolerating and so far check lipid liver profile in several weeks patient to follow-up in 4 to 6 months - Lipid Profile - Hepatic function panel  Depression in remission currently As best as possible stay active and involved with things

## 2021-08-24 DIAGNOSIS — E785 Hyperlipidemia, unspecified: Secondary | ICD-10-CM | POA: Diagnosis not present

## 2021-08-24 DIAGNOSIS — I10 Essential (primary) hypertension: Secondary | ICD-10-CM | POA: Diagnosis not present

## 2021-08-25 LAB — HEPATIC FUNCTION PANEL
ALT: 8 IU/L (ref 0–32)
AST: 14 IU/L (ref 0–40)
Albumin: 4.4 g/dL (ref 3.8–4.8)
Alkaline Phosphatase: 74 IU/L (ref 44–121)
Bilirubin Total: 0.3 mg/dL (ref 0.0–1.2)
Bilirubin, Direct: 0.11 mg/dL (ref 0.00–0.40)
Total Protein: 6.3 g/dL (ref 6.0–8.5)

## 2021-08-25 LAB — LIPID PANEL
Chol/HDL Ratio: 2.6 ratio (ref 0.0–4.4)
Cholesterol, Total: 163 mg/dL (ref 100–199)
HDL: 63 mg/dL (ref >39)
LDL Chol Calc (NIH): 80 mg/dL (ref 0–99)
Triglycerides: 114 mg/dL (ref 0–149)
VLDL Cholesterol Cal: 20 mg/dL (ref 5–40)

## 2021-08-26 ENCOUNTER — Other Ambulatory Visit: Payer: Self-pay | Admitting: Family Medicine

## 2021-10-19 ENCOUNTER — Ambulatory Visit (INDEPENDENT_AMBULATORY_CARE_PROVIDER_SITE_OTHER): Payer: Medicare HMO

## 2021-10-19 VITALS — Ht 65.0 in | Wt 218.0 lb

## 2021-10-19 DIAGNOSIS — Z0000 Encounter for general adult medical examination without abnormal findings: Secondary | ICD-10-CM | POA: Diagnosis not present

## 2021-10-19 NOTE — Patient Instructions (Signed)
Susan Oneill , ?Thank you for taking time to come for your Medicare Wellness Visit. I appreciate your ongoing commitment to your health goals. Please review the following plan we discussed and let me know if I can assist you in the future.  ? ?Screening recommendations/referrals: ?Colonoscopy: DONE 03/23/2020 Repeat in 10 years ? ?Mammogram: DONE 11/11/2020 Repeat annually ? ?Bone Density: DONE 11/11/2020 Repeat every 2 years. ? ?Recommended yearly ophthalmology/optometry visit for glaucoma screening and checkup ?Recommended yearly dental visit for hygiene and checkup ? ?Vaccinations: ?Influenza vaccine: DONE 03/05/2021 Repeat annually ?Pneumococcal vaccine: DONE 11/27/2018 AND 04/10/2020 ?Tdap vaccine: DUE. Repeat in 10 years ? ?Shingles vaccine: DONE 03/22/2019, 04/04/2019 AND 06/04/2019   ?Covid-19:DONE 08/16/2019, 09/13/2019 AND 07/03/2020 ? ?Advanced directives: Advance directive discussed with you today. I have provided a copy for you to complete at home and have notarized. Once this is complete please bring a copy in to our office so we can scan it into your chart. ?MAILED OUT TODAY. ? ?Conditions/risks identified: Aim for 30 minutes of exercise or brisk walking, 6-8 glasses of water, and 5 servings of fruits and vegetables each day. ? ? ?Next appointment: Follow up in one year for your annual wellness visit 2024. ? ? ?Preventive Care 46 Years and Older, Female ?Preventive care refers to lifestyle choices and visits with your health care provider that can promote health and wellness. ?What does preventive care include? ?A yearly physical exam. This is also called an annual well check. ?Dental exams once or twice a year. ?Routine eye exams. Ask your health care provider how often you should have your eyes checked. ?Personal lifestyle choices, including: ?Daily care of your teeth and gums. ?Regular physical activity. ?Eating a healthy diet. ?Avoiding tobacco and drug use. ?Limiting alcohol use. ?Practicing safe  sex. ?Taking low-dose aspirin every day. ?Taking vitamin and mineral supplements as recommended by your health care provider. ?What happens during an annual well check? ?The services and screenings done by your health care provider during your annual well check will depend on your age, overall health, lifestyle risk factors, and family history of disease. ?Counseling  ?Your health care provider may ask you questions about your: ?Alcohol use. ?Tobacco use. ?Drug use. ?Emotional well-being. ?Home and relationship well-being. ?Sexual activity. ?Eating habits. ?History of falls. ?Memory and ability to understand (cognition). ?Work and work Statistician. ?Reproductive health. ?Screening  ?You may have the following tests or measurements: ?Height, weight, and BMI. ?Blood pressure. ?Lipid and cholesterol levels. These may be checked every 5 years, or more frequently if you are over 38 years old. ?Skin check. ?Lung cancer screening. You may have this screening every year starting at age 48 if you have a 30-pack-year history of smoking and currently smoke or have quit within the past 15 years. ?Fecal occult blood test (FOBT) of the stool. You may have this test every year starting at age 57. ?Flexible sigmoidoscopy or colonoscopy. You may have a sigmoidoscopy every 5 years or a colonoscopy every 10 years starting at age 31. ?Hepatitis C blood test. ?Hepatitis B blood test. ?Sexually transmitted disease (STD) testing. ?Diabetes screening. This is done by checking your blood sugar (glucose) after you have not eaten for a while (fasting). You may have this done every 1-3 years. ?Bone density scan. This is done to screen for osteoporosis. You may have this done starting at age 38. ?Mammogram. This may be done every 1-2 years. Talk to your health care provider about how often you should have regular mammograms. ?Talk  with your health care provider about your test results, treatment options, and if necessary, the need for more  tests. ?Vaccines  ?Your health care provider may recommend certain vaccines, such as: ?Influenza vaccine. This is recommended every year. ?Tetanus, diphtheria, and acellular pertussis (Tdap, Td) vaccine. You may need a Td booster every 10 years. ?Zoster vaccine. You may need this after age 57. ?Pneumococcal 13-valent conjugate (PCV13) vaccine. One dose is recommended after age 56. ?Pneumococcal polysaccharide (PPSV23) vaccine. One dose is recommended after age 5. ?Talk to your health care provider about which screenings and vaccines you need and how often you need them. ?This information is not intended to replace advice given to you by your health care provider. Make sure you discuss any questions you have with your health care provider. ?Document Released: 07/17/2015 Document Revised: 03/09/2016 Document Reviewed: 04/21/2015 ?Elsevier Interactive Patient Education ? 2017 [place]. ? ?Fall Prevention in the Home ?Falls can cause injuries. They can happen to people of all ages. There are many things you can do to make your home safe and to help prevent falls. ?What can I do on the outside of my home? ?Regularly fix the edges of walkways and driveways and fix any cracks. ?Remove anything that might make you trip as you walk through a door, such as a raised step or threshold. ?Trim any bushes or trees on the path to your home. ?Use bright outdoor lighting. ?Clear any walking paths of anything that might make someone trip, such as rocks or tools. ?Regularly check to see if handrails are loose or broken. Make sure that both sides of any steps have handrails. ?Any raised decks and porches should have guardrails on the edges. ?Have any leaves, snow, or ice cleared regularly. ?Use sand or salt on walking paths during winter. ?Clean up any spills in your garage right away. This includes oil or grease spills. ?What can I do in the bathroom? ?Use night lights. ?Install grab bars by the toilet and in the tub and shower.  Do not use towel bars as grab bars. ?Use non-skid mats or decals in the tub or shower. ?If you need to sit down in the shower, use a plastic, non-slip stool. ?Keep the floor dry. Clean up any water that spills on the floor as soon as it happens. ?Remove soap buildup in the tub or shower regularly. ?Attach bath mats securely with double-sided non-slip rug tape. ?Do not have throw rugs and other things on the floor that can make you trip. ?What can I do in the bedroom? ?Use night lights. ?Make sure that you have a light by your bed that is easy to reach. ?Do not use any sheets or blankets that are too big for your bed. They should not hang down onto the floor. ?Have a firm chair that has side arms. You can use this for support while you get dressed. ?Do not have throw rugs and other things on the floor that can make you trip. ?What can I do in the kitchen? ?Clean up any spills right away. ?Avoid walking on wet floors. ?Keep items that you use a lot in easy-to-reach places. ?If you need to reach something above you, use a strong step stool that has a grab bar. ?Keep electrical cords out of the way. ?Do not use floor polish or wax that makes floors slippery. If you must use wax, use non-skid floor wax. ?Do not have throw rugs and other things on the floor that can make you  trip. ?What can I do with my stairs? ?Do not leave any items on the stairs. ?Make sure that there are handrails on both sides of the stairs and use them. Fix handrails that are broken or loose. Make sure that handrails are as long as the stairways. ?Check any carpeting to make sure that it is firmly attached to the stairs. Fix any carpet that is loose or worn. ?Avoid having throw rugs at the top or bottom of the stairs. If you do have throw rugs, attach them to the floor with carpet tape. ?Make sure that you have a light switch at the top of the stairs and the bottom of the stairs. If you do not have them, ask someone to add them for you. ?What else  can I do to help prevent falls? ?Wear shoes that: ?Do not have high heels. ?Have rubber bottoms. ?Are comfortable and fit you well. ?Are closed at the toe. Do not wear sandals. ?If you use a stepladder: ?Make sure th

## 2021-10-19 NOTE — Progress Notes (Addendum)
? ?Subjective:  ? Susan Oneill is a 71 y.o. female who presents for Medicare Annual (Subsequent) preventive assessment ?Virtual Visit via Telephone Note ? ?I connected with  Brittane B Doeden on 10/19/21 at  1:00 PM EDT by telephone and verified that I am speaking with the correct person using two identifiers. ? ?Location: ?Patient: HOME ?Provider: [facility] ?Persons participating in the virtual visit: patient/Nurse Health Advisor ?  ?I discussed the limitations, risks, security and privacy concerns of performing an evaluation and management service by telephone and the availability of in person appointments. The patient expressed understanding and agreed to proceed. ? ?Interactive audio and video telecommunications were attempted between this nurse and patient, however failed, due to patient having technical difficulties OR patient did not have access to video capability.  We continued and completed visit with audio only. ? ?Some vital signs may be absent or patient reported.  ? ?Chriss Driver, LPN ? ?Review of Systems    ? ?Cardiac Risk Factors include: advanced age (>35mn, >>25women);hypertension;sedentary lifestyle;obesity (BMI >30kg/m2) ? ?   ?Objective:  ?  ?Today's Vitals  ? 10/19/21 1303  ?Weight: 218 lb (98.9 kg)  ?Height: '5\' 5"'$  (1.651 m)  ? ?Body mass index is 36.28 kg/m?. ? ? ?  10/19/2021  ?  1:18 PM 10/13/2020  ?  9:05 AM 03/23/2020  ? 12:02 PM 09/11/2018  ?  8:01 AM 09/06/2018  ?  3:18 PM 10/21/2013  ?  2:37 PM 10/17/2013  ?  1:33 PM  ?Advanced Directives  ?Does Patient Have a Medical Advance Directive? No No No No No Patient does not have advance directive;Patient would not like information Patient does not have advance directive;Patient would not like information  ?Would patient like information on creating a medical advance directive? No - Patient declined No - Patient declined No - Patient declined No - Patient declined No - Patient declined    ?Pre-existing out of facility DNR order (yellow form  or pink MOST form)      No   ? ? ?Current Medications (verified) ?Outpatient Encounter Medications as of 10/19/2021  ?Medication Sig  ? ALPRAZolam (XANAX) 0.5 MG tablet TAKE 1/2- TO 1 TABLET BY MOUTH TWO TIMES A DAY AS NEEDED  ? amoxicillin (AMOXIL) 500 MG capsule Take 2,000 mg by mouth once as needed (Before dental procedures).  ? escitalopram (LEXAPRO) 10 MG tablet Take 1 tablet (10 mg total) by mouth daily.  ? esomeprazole (NEXIUM) 20 MG capsule Take 20 mg by mouth daily before breakfast.   ? hydrochlorothiazide (HYDRODIURIL) 25 MG tablet TAKE 1 TABLET EVERY DAY  ? lisinopril (ZESTRIL) 40 MG tablet TAKE 1 TABLET EVERY EVENING  ? naproxen (NAPROSYN) 500 MG tablet TAKE ONE TABLET TWICE DAILY WITH A MEAL AS NEEDED FOR PAIN  ? rosuvastatin (CRESTOR) 5 MG tablet Take 1 tablet (5 mg total) by mouth daily.  ? traMADol (ULTRAM) 50 MG tablet TAKE ONE TABLET BY MOUTH THREE TIMES A DAY AS NEEDED  ? ?No facility-administered encounter medications on file as of 10/19/2021.  ? ? ?Allergies (verified) ?Sulfa antibiotics, Amoxicillin, Augmentin [amoxicillin-pot clavulanate], Ciprofloxacin, Codeine, Latex, Mobic [meloxicam], and Sulfonamide derivatives  ? ?History: ?Past Medical History:  ?Diagnosis Date  ? Acid reflux   ? Anxiety   ? xanax as needed  ? Anxiousness 09/25/2019  ? Arthritis   ? oa and pain both knees  ? Atypical chest pain 5 / 2014  ? DISCHARGE SUMMARY [facility], NEGATIVE NUCLEAR  STRESS TEST - PAIN ATTRIBUTED TO PT'S GERD- PT STATES NO FURTHER CHEST PAINS - NEXIUM HAS HELPED PAIN  ? Cyst in hand   ? ?? CYST BACK OF LEFT HAND - PT STATES THE AREA POPPED UP - RAISED AREA - SOMETIMES PAINFUL - HAS A BRACE TO WEAR ON THAT HAND WHEN SLEEPING  ? Hemorrhoids   ? HTN (hypertension)   ? Hyperlipidemia   ? Tinnitus   ? Transfusion history 1978  ? after childbirth - not all of placenta passed and pt started bleeding  ? Urticaria   ? Uterine fibroid   ? no problems from the fibroids  ? Varicose veins   ? ?Past  Surgical History:  ?Procedure Laterality Date  ? CARPAL TUNNEL RELEASE    ? bilateral  ? COLONOSCOPY WITH PROPOFOL N/A 03/23/2020  ? Procedure: COLONOSCOPY WITH PROPOFOL;  Surgeon: Eloise Harman, DO;  [facility];  Service: Endoscopy;  Laterality: N/A;  1:30pm  ? DILATATION & CURETTAGE/HYSTEROSCOPY WITH MYOSURE N/A 09/11/2018  ? Procedure: [place];  Surgeon: Donnamae Jude, MD;  Location: [place];  Service: Gynecology;  Laterality: N/A;  ? HEMORROIDECTOMY    ? HERNIA REPAIR  [id number]  ? umbilical hernia repair  ? JOINT REPLACEMENT    ? KNEE SURGERY    ? bilateral knee arthroscopy- rt knee sept 2011, left knee march 2010  ? ROTATOR CUFF REPAIR    ? TOTAL KNEE ARTHROPLASTY Right 10/21/2013  ? Procedure: RIGHT TOTAL KNEE ARTHROPLASTY;  Surgeon: Gearlean Alf, MD;  Location: [facility];  Service: Orthopedics;  Laterality: Right;  ? ?Family History  ?Problem Relation Age of Onset  ? Arthritis Other   ? Heart disease Other   ? Heart disease Father   ? High blood pressure Father   ? Colon cancer Paternal Uncle   ? ?Social History  ? ?Socioeconomic History  ? Marital status: Widowed  ?  Spouse name: Not on file  ? Number of children: Not on file  ? Years of education: 49  ? Highest education level: Not on file  ?Occupational History  ? Occupation: Psychologist, educational  ?Tobacco Use  ? Smoking status: Never  ? Smokeless tobacco: Never  ?Vaping Use  ? Vaping Use: Never used  ?Substance and Sexual Activity  ? Alcohol use: No  ? Drug use: No  ? Sexual activity: Yes  ?Other Topics Concern  ? Not on file  ?Social History Narrative  ? Not on file  ? ?Social Determinants of Health  ? ?Financial Resource Strain: Low Risk   ? Difficulty of Paying Living Expenses: Not very hard  ?Food Insecurity: No Food Insecurity  ? Worried About Charity fundraiser in the Last Year: Never true  ? Ran Out of Food in the Last Year: Never true  ?Transportation Needs: No Transportation Needs   ? Lack of Transportation (Medical): No  ? Lack of Transportation (Non-Medical): No  ?Physical Activity: Inactive  ? Days of Exercise per Week: 0 days  ? Minutes of Exercise per Session: 0 min  ?Stress: Stress Concern Present  ? Feeling of Stress : To some extent  ?Social Connections: Socially Isolated  ? Frequency of Communication with Friends and Family: Three times a week  ? Frequency of Social Gatherings with Friends and Family: More than three times a week  ? Attends Religious Services: Never  ? Active Member of Clubs or Organizations: No  ? Attends Archivist Meetings: Never  ?  Marital Status: Widowed  ? ? ?Tobacco Counseling ?Counseling given: Not Answered ? ? ?Clinical Intake: ? ?Pre-visit preparation completed: Yes ? ?Pain : No/denies pain ? ?  ? ?BMI - recorded: 36.28 ?Nutritional Status: BMI > 30  Obese ?Nutritional Risks: None ?Diabetes: No ? ?How often do you need to have someone help you when you read instructions, pamphlets, or other written materials from your doctor or pharmacy?: 1 - Never ? ?Diabetic?NO ? ?Interpreter Needed?: No ? ?Information entered by :: mj Latrail Pounders, lpn ? ? ?Activities of Daily Living ? ?  10/19/2021  ?  1:20 PM 10/18/2021  ? 11:39 PM  ?In your present state of health, do you have any difficulty performing the following activities:  ?Hearing? 0 0  ?Vision? 0 0  ?Difficulty concentrating or making decisions? 0 0  ?Walking or climbing stairs? 1 1  ?Dressing or bathing? 0 0  ?Doing errands, shopping? 0 0  ?Preparing Food and eating ? N N  ?Using the Toilet? N N  ?In the past six months, have you accidently leaked urine? N N  ?Do you have problems with loss of bowel control? N N  ?Managing your Medications? N N  ?Managing your Finances? N N  ?Housekeeping or managing your Housekeeping? N N  ? ? ?Patient Care Team: ?Kathyrn Drown, MD as PCP - General (Family Medicine) ?Beryle Lathe, [place] (Inactive) (Pharmacist) ? ?Indicate any recent Medical Services you may have  received from other than [facility] providers in the past year (date may be approximate). ? ?   ?Assessment:  ? This is a routine wellness assessment for Diann. ? ?Hearing/Vision screen ?Hearing Screening - Com

## 2021-11-22 ENCOUNTER — Other Ambulatory Visit: Payer: Self-pay | Admitting: Family Medicine

## 2021-12-03 ENCOUNTER — Other Ambulatory Visit (HOSPITAL_COMMUNITY): Payer: Self-pay | Admitting: Family Medicine

## 2021-12-03 DIAGNOSIS — Z1231 Encounter for screening mammogram for malignant neoplasm of breast: Secondary | ICD-10-CM

## 2021-12-06 ENCOUNTER — Ambulatory Visit (HOSPITAL_COMMUNITY)
Admission: RE | Admit: 2021-12-06 | Discharge: 2021-12-06 | Disposition: A | Discharge status: Home / Self Care | Payer: Medicare HMO | Source: Ambulatory Visit | Attending: Family Medicine | Admitting: Family Medicine

## 2021-12-06 DIAGNOSIS — Z1231 Encounter for screening mammogram for malignant neoplasm of breast: Secondary | ICD-10-CM | POA: Diagnosis not present

## 2021-12-27 ENCOUNTER — Other Ambulatory Visit: Payer: Self-pay | Admitting: Family Medicine

## 2022-01-03 ENCOUNTER — Encounter: Payer: Self-pay | Admitting: Family Medicine

## 2022-01-03 ENCOUNTER — Ambulatory Visit (INDEPENDENT_AMBULATORY_CARE_PROVIDER_SITE_OTHER): Payer: Medicare HMO | Admitting: Family Medicine

## 2022-01-03 VITALS — BP 118/75 | Temp 97.9°F | Wt 227.0 lb

## 2022-01-03 DIAGNOSIS — G4700 Insomnia, unspecified: Secondary | ICD-10-CM

## 2022-01-03 DIAGNOSIS — I10 Essential (primary) hypertension: Secondary | ICD-10-CM | POA: Diagnosis not present

## 2022-01-03 DIAGNOSIS — R5383 Other fatigue: Secondary | ICD-10-CM | POA: Diagnosis not present

## 2022-01-03 DIAGNOSIS — E785 Hyperlipidemia, unspecified: Secondary | ICD-10-CM | POA: Diagnosis not present

## 2022-01-03 DIAGNOSIS — Z79899 Other long term (current) drug therapy: Secondary | ICD-10-CM | POA: Diagnosis not present

## 2022-01-03 DIAGNOSIS — R0683 Snoring: Secondary | ICD-10-CM | POA: Diagnosis not present

## 2022-01-03 MED ORDER — TRAMADOL HCL 50 MG PO TABS: ORAL_TABLET | ORAL | 4 refills | Status: DC

## 2022-01-03 MED ORDER — HYDROCHLOROTHIAZIDE 25 MG PO TABS: 25.0000 mg | ORAL_TABLET | Freq: Every day | ORAL | 1 refills | Status: DC

## 2022-01-03 MED ORDER — LISINOPRIL 40 MG PO TABS
40.0000 mg | ORAL_TABLET | Freq: Every evening | ORAL | 1 refills | Status: DC
Start: 2022-01-03 — End: 2022-08-04

## 2022-01-03 MED ORDER — NAPROXEN 500 MG PO TABS: ORAL_TABLET | ORAL | 1 refills | Status: DC

## 2022-01-03 NOTE — Progress Notes (Signed)
   Subjective:    Patient ID: Susan Oneill, female    DOB: [birth year], 71 y.o.   MRN: [id number]  HPI Pt arrives to follow up on blood pressure. Pt states blood pressure has been doing well. Checks blood pressure occasionally. Pt has been feeling sluggish and tired;pt unsure if due to Lexapro.  Patient at times feels tired She does snore at night Does not know if she pauses with breathing She states blood pressure doing okay  Review of Systems     Objective:   Physical Exam General-in no acute distress Eyes-no discharge Lungs-respiratory rate normal, CTA CV-no murmurs,RRR Extremities skin warm dry no edema Neuro grossly normal Behavior normal, alert        Assessment & Plan:  1. Insomnia, unspecified type She has difficult time sleeping at night using a Tylenol PM we did be give her a behavioral handout of ways to try to improve her sleep in addition to this try to avoid medications other than melatonin  2. Primary hypertension Blood pressure decent control watch diet stay active - Basic metabolic panel - Lipid panel - CBC with Differential - Hepatic function panel  3. Hyperlipidemia, unspecified hyperlipidemia type Cholesterol check lab work continue medication - Basic metabolic panel - Lipid panel - CBC with Differential - Hepatic function panel  4. Other fatigue Mild to moderate fatigue it is possible this could be sleep apnea versus deconditioning.  Follow-up if progressive troubles or worse.  May end up needing to have sleep study see below - Basic metabolic panel - Lipid panel - CBC with Differential - Hepatic function panel  5. Snoring Patient may need to have sleep study patient will give [place] feedback on how her energy level is after several weeks of being off Lexapro  6. High risk medication use Medications ordered - Basic metabolic panel - Lipid panel - CBC with Differential - Hepatic function panel  Generalized anxiety disorder with mild  depression doing much better depression is resolved she lost her husband last year.  We did have a shared discussion we will go ahead and stop the Lexapro.  Follow-up if progressive troubles.  We will taper Lexapro to 5 mg a day for 30 days then stop

## 2022-02-23 ENCOUNTER — Other Ambulatory Visit: Payer: Self-pay | Admitting: Family Medicine

## 2022-02-28 ENCOUNTER — Other Ambulatory Visit: Payer: Self-pay | Admitting: Family Medicine

## 2022-02-28 DIAGNOSIS — E785 Hyperlipidemia, unspecified: Secondary | ICD-10-CM

## 2022-06-01 ENCOUNTER — Other Ambulatory Visit: Payer: Self-pay | Admitting: Family Medicine

## 2022-06-01 DIAGNOSIS — E785 Hyperlipidemia, unspecified: Secondary | ICD-10-CM

## 2022-06-06 ENCOUNTER — Ambulatory Visit: Payer: Medicare HMO | Admitting: Family Medicine

## 2022-07-06 ENCOUNTER — Ambulatory Visit: Payer: Medicare HMO | Admitting: Family Medicine

## 2022-07-25 DIAGNOSIS — Z79899 Other long term (current) drug therapy: Secondary | ICD-10-CM | POA: Diagnosis not present

## 2022-07-25 DIAGNOSIS — E785 Hyperlipidemia, unspecified: Secondary | ICD-10-CM | POA: Diagnosis not present

## 2022-07-25 DIAGNOSIS — R5383 Other fatigue: Secondary | ICD-10-CM | POA: Diagnosis not present

## 2022-07-25 DIAGNOSIS — I10 Essential (primary) hypertension: Secondary | ICD-10-CM | POA: Diagnosis not present

## 2022-07-26 LAB — CBC WITH DIFFERENTIAL/PLATELET
Basophils Absolute: 0.1 10*3/uL (ref 0.0–0.2)
Basos: 2 %
EOS (ABSOLUTE): 0.1 10*3/uL (ref 0.0–0.4)
Eos: 4 %
Hematocrit: 36.9 % (ref 34.0–46.6)
Hemoglobin: 11.6 g/dL (ref 11.1–15.9)
Immature Grans (Abs): 0 10*3/uL (ref 0.0–0.1)
Immature Granulocytes: 0 %
Lymphocytes Absolute: 0.9 10*3/uL (ref 0.7–3.1)
Lymphs: 29 %
MCH: 27.7 pg (ref 26.6–33.0)
MCHC: 31.4 g/dL — ABNORMAL LOW (ref 31.5–35.7)
MCV: 88 fL (ref 79–97)
Monocytes Absolute: 0.3 10*3/uL (ref 0.1–0.9)
Monocytes: 9 %
Neutrophils Absolute: 1.8 10*3/uL (ref 1.4–7.0)
Neutrophils: 56 %
Platelets: 217 10*3/uL (ref 150–450)
RBC: 4.19 x10E6/uL (ref 3.77–5.28)
RDW: 13.6 % (ref 11.7–15.4)
WBC: 3.2 10*3/uL — ABNORMAL LOW (ref 3.4–10.8)

## 2022-07-26 LAB — BASIC METABOLIC PANEL
BUN/Creatinine Ratio: 20 (ref 12–28)
BUN: 17 mg/dL (ref 8–27)
CO2: 23 mmol/L (ref 20–29)
Calcium: 9 mg/dL (ref 8.7–10.3)
Chloride: 103 mmol/L (ref 96–106)
Creatinine, Ser: 0.84 mg/dL (ref 0.57–1.00)
Glucose: 88 mg/dL (ref 70–99)
Potassium: 4.7 mmol/L (ref 3.5–5.2)
Sodium: 139 mmol/L (ref 134–144)
eGFR: 75 mL/min/{1.73_m2} (ref >59)

## 2022-07-26 LAB — LIPID PANEL
Chol/HDL Ratio: 2.3 ratio (ref 0.0–4.4)
Cholesterol, Total: 144 mg/dL (ref 100–199)
HDL: 63 mg/dL (ref >39)
LDL Chol Calc (NIH): 68 mg/dL (ref 0–99)
Triglycerides: 63 mg/dL (ref 0–149)
VLDL Cholesterol Cal: 13 mg/dL (ref 5–40)

## 2022-07-26 LAB — HEPATIC FUNCTION PANEL
ALT: 10 IU/L (ref 0–32)
AST: 16 IU/L (ref 0–40)
Albumin: 4.4 g/dL (ref 3.9–4.9)
Alkaline Phosphatase: 68 IU/L (ref 44–121)
Bilirubin Total: 0.4 mg/dL (ref 0.0–1.2)
Bilirubin, Direct: 0.14 mg/dL (ref 0.00–0.40)
Total Protein: 6.3 g/dL (ref 6.0–8.5)

## 2022-08-04 ENCOUNTER — Ambulatory Visit (INDEPENDENT_AMBULATORY_CARE_PROVIDER_SITE_OTHER): Payer: Medicare HMO | Admitting: Family Medicine

## 2022-08-04 VITALS — BP 134/76 | Wt 221.0 lb

## 2022-08-04 DIAGNOSIS — M65332 Trigger finger, left middle finger: Secondary | ICD-10-CM

## 2022-08-04 DIAGNOSIS — D709 Neutropenia, unspecified: Secondary | ICD-10-CM

## 2022-08-04 DIAGNOSIS — E785 Hyperlipidemia, unspecified: Secondary | ICD-10-CM

## 2022-08-04 DIAGNOSIS — I10 Essential (primary) hypertension: Secondary | ICD-10-CM | POA: Diagnosis not present

## 2022-08-04 DIAGNOSIS — R5383 Other fatigue: Secondary | ICD-10-CM | POA: Diagnosis not present

## 2022-08-04 MED ORDER — LISINOPRIL 40 MG PO TABS: 40.0000 mg | ORAL_TABLET | Freq: Every evening | ORAL | 1 refills | Status: DC

## 2022-08-04 MED ORDER — NAPROXEN 500 MG PO TABS: ORAL_TABLET | ORAL | 1 refills | Status: DC

## 2022-08-04 MED ORDER — HYDROCHLOROTHIAZIDE 25 MG PO TABS
25.0000 mg | ORAL_TABLET | Freq: Every day | ORAL | 1 refills | Status: DC
Start: 2022-08-04 — End: 2023-02-14

## 2022-08-04 MED ORDER — TRAMADOL HCL 50 MG PO TABS: ORAL_TABLET | ORAL | 4 refills | Status: DC

## 2022-08-04 NOTE — Progress Notes (Signed)
Subjective:    Patient ID: Susan Oneill, female    DOB: August 10, 1950, 72 y.o.   MRN: [id number]  HPI  Patient arrives today for medication check and lab check.Patient states no concerns or issues today. Trigger middle finger of left hand - Plan: AMB referral to orthopedics  Primary hypertension  Hyperlipidemia, unspecified hyperlipidemia type  Other fatigue  Results for orders placed or performed in visit on 54/56/25  Basic metabolic panel  Result Value Ref Range   Glucose 88 70 - 99 mg/dL   BUN 17 8 - 27 mg/dL   Creatinine, Ser 0.84 0.57 - 1.00 mg/dL   eGFR 75 >59 mL/min/1.73   BUN/Creatinine Ratio 20 12 - 28   Sodium 139 134 - 144 mmol/L   Potassium 4.7 3.5 - 5.2 mmol/L   Chloride 103 96 - 106 mmol/L   CO2 23 20 - 29 mmol/L   Calcium 9.0 8.7 - 10.3 mg/dL  Lipid panel  Result Value Ref Range   Cholesterol, Total 144 100 - 199 mg/dL   Triglycerides 63 0 - 149 mg/dL   HDL 63 >39 mg/dL   VLDL Cholesterol Cal 13 5 - 40 mg/dL   LDL Chol Calc (NIH) 68 0 - 99 mg/dL   Chol/HDL Ratio 2.3 0.0 - 4.4 ratio  CBC with Differential  Result Value Ref Range   WBC 3.2 (L) 3.4 - 10.8 x10E3/uL   RBC 4.19 3.77 - 5.28 x10E6/uL   Hemoglobin 11.6 11.1 - 15.9 g/dL   Hematocrit 36.9 34.0 - 46.6 %   MCV 88 79 - 97 fL   MCH 27.7 26.6 - 33.0 pg   MCHC 31.4 (L) 31.5 - 35.7 g/dL   RDW 13.6 11.7 - 15.4 %   Platelets 217 150 - 450 x10E3/uL   Neutrophils 56 Not Estab. %   Lymphs 29 Not Estab. %   Monocytes 9 Not Estab. %   Eos 4 Not Estab. %   Basos 2 Not Estab. %   Neutrophils Absolute 1.8 1.4 - 7.0 x10E3/uL   Lymphocytes Absolute 0.9 0.7 - 3.1 x10E3/uL   Monocytes Absolute 0.3 0.1 - 0.9 x10E3/uL   EOS (ABSOLUTE) 0.1 0.0 - 0.4 x10E3/uL   Basophils Absolute 0.1 0.0 - 0.2 x10E3/uL   Immature Granulocytes 0 Not Estab. %   Immature Grans (Abs) 0.0 0.0 - 0.1 x10E3/uL  Hepatic function panel  Result Value Ref Range   Total Protein 6.3 6.0 - 8.5 g/dL   Albumin 4.4 3.9 - 4.9 g/dL    Bilirubin Total 0.4 0.0 - 1.2 mg/dL   Bilirubin, Direct 0.14 0.00 - 0.40 mg/dL   Alkaline Phosphatase 68 44 - 121 IU/L   AST 16 0 - 40 IU/L   ALT 10 0 - 32 IU/L      Review of Systems     Objective:   Physical Exam General-in no acute distress Eyes-no discharge Lungs-respiratory rate normal, CTA CV-no murmurs,RRR Extremities skin warm dry no edema Neuro grossly normal Behavior normal, alert        Assessment & Plan:  1. Primary hypertension Blood pressure decent control continue current measures  2. Hyperlipidemia, unspecified hyperlipidemia type Cholesterol pro, looks good continue current measures healthy diet  3. Other fatigue It is possible she is dealing with underlying sleep apnea because of snoring at night but she prefers not to do sleep study currently she will let us know if any ongoing troubles  4. Trigger middle finger of left hand Referral to orthopedics due  to trigger finger in the middle finger - AMB referral to orthopedics  5. Neutropenia, unspecified type ([place]) White blood count slightly low no constitutional symptoms recommend repeat lab work by later this summer

## 2022-08-29 ENCOUNTER — Encounter: Payer: Medicare HMO | Admitting: Orthopedic Surgery

## 2022-09-01 ENCOUNTER — Encounter: Payer: Self-pay | Admitting: Radiology

## 2022-09-09 ENCOUNTER — Encounter: Payer: Medicare HMO | Admitting: Orthopedic Surgery

## 2022-10-07 ENCOUNTER — Encounter: Payer: Self-pay | Admitting: Orthopedic Surgery

## 2022-10-07 ENCOUNTER — Ambulatory Visit: Payer: Medicare HMO | Admitting: Orthopedic Surgery

## 2022-10-07 VITALS — BP 159/83 | HR 69 | Ht 65.0 in | Wt 220.0 lb

## 2022-10-07 DIAGNOSIS — M65331 Trigger finger, right middle finger: Secondary | ICD-10-CM | POA: Diagnosis not present

## 2022-10-07 MED ORDER — METHYLPREDNISOLONE ACETATE 40 MG/ML IJ SUSP
40.0000 mg | Freq: Once | INTRAMUSCULAR | Status: AC
  Administered 2022-10-07: 40 mg via INTRA_ARTICULAR

## 2022-10-07 NOTE — Progress Notes (Signed)
Office Visit Note   Patient: Susan Oneill           Date of Birth: May 29, 1951           MRN: [id number] Visit Date: 10/07/2022 Requested by: Babs SciaraLuking, Scott A, MD [address] [place] [id number] PCP: Babs SciaraLuking, Scott A, MD  Subjective: Chief Complaint  Patient presents with   Hand Problem    Right middle finger catching     HPI: 72 year old female right-hand-dominant presents with pain and catching of her right long finger.  Patient gives a history of having a cyst removed from the area of the A1 pulley years ago and reports 1 year history of catching locking and pain  No prior treatment              ROS: Denies any numbness or tingling in the finger  Assessment & Plan:  Images personally read and my interpretation : No imaging  Visit Diagnoses:  1. Trigger finger, right middle finger     Plan: Injection follow-up in 2 weeks repeat if needed  Trigger finger injection Right middle finger  Procedure injection A1 pulley Medications lidocaine 1% 1 mL and Depo-Medrol 40 mg 1 mL Skin prep alcohol and ethyl chloride Verbal consent was obtained Timeout confirmed the injection site  After cleaning the skin with alcohol and anesthetizing the skin with ethyl chloride the A1 pulley was palpated and the injection was performed without complication   Follow-Up Instructions: Return in about 2 weeks (around 10/21/2022) for follow up right middle finger trigger finger after injection .   Orders:  Meds ordered this encounter  Medications   methylPREDNISolone acetate (DEPO-MEDROL) injection 40 mg      Objective: Vital Signs: BP (!) 159/83   Pulse 69   Ht 5\' 5"  (1.651 m)   Wt 220 lb (99.8 kg)   BMI 36.61 kg/m   Physical Exam Vitals and nursing note reviewed.  Constitutional:      Appearance: Normal appearance.  HENT:     Head: Normocephalic and atraumatic.  Eyes:     General: No scleral icterus.       Right eye: No discharge.        Left eye: No  discharge.     Extraocular Movements: Extraocular movements intact.     Conjunctiva/sclera: Conjunctivae normal.     Pupils: Pupils are equal, round, and reactive to light.  Cardiovascular:     Rate and Rhythm: Normal rate.     Pulses: Normal pulses.  Skin:    General: Skin is warm and dry.     Capillary Refill: Capillary refill takes less than 2 seconds.  Neurological:     General: No focal deficit present.     Mental Status: She is alert and oriented to person, place, and time.  Psychiatric:        Mood and Affect: Mood normal.        Behavior: Behavior normal.        Thought Content: Thought content normal.        Judgment: Judgment normal.      Right Hand Exam   Tenderness  Right hand tenderness location: Tenderness A1 pulley palmar side right middle or long finger.  Range of Motion  Hand  MP Ring: normal   Other  Erythema: absent Scars: present Sensation: normal Pulse: present       Specialty Comments:  No specialty comments available.  Imaging: No results found.   PMFS  History: Patient Active Problem List   Diagnosis Date Noted   Right rotator cuff tear 03/11/2020   Constipation 03/04/2020   Encounter for screening colonoscopy 03/04/2020   Arthritis of right acromioclavicular joint 01/21/2020   Bilateral shoulder bursitis 12/10/2019   Anxiousness 09/25/2019   Cystocele, midline 07/24/2018   Postmenopausal bleeding 07/24/2018   Vaginal discharge 07/24/2018   OA (osteoarthritis) of knee 10/21/2013   Hyperlipidemia 05/28/2013   Idiopathic urticaria 05/28/2013   GERD (gastroesophageal reflux disease) 11/28/2012   HTN (hypertension)    Acid reflux    Varicose vein of leg    Bursitis of knee 12/02/2011   Past Medical History:  Diagnosis Date   Acid reflux    Anxiety    xanax as needed   Anxiousness 09/25/2019   Arthritis    oa and pain both knees   Atypical chest pain 5 / 2014   DISCHARGE SUMMARY [facility] - RULLED OUT FOR MI, NEGATIVE NUCLEAR  STRESS TEST - PAIN ATTRIBUTED TO PT'S GERD- PT STATES NO FURTHER CHEST PAINS - NEXIUM HAS HELPED PAIN   Cyst in hand    ?? CYST BACK OF LEFT HAND - PT STATES THE AREA POPPED UP - RAISED AREA - SOMETIMES PAINFUL - HAS A BRACE TO WEAR ON THAT HAND WHEN SLEEPING   Hemorrhoids    HTN (hypertension)    Hyperlipidemia    Tinnitus    Transfusion history 1978   after childbirth - not all of placenta passed and pt started bleeding   Urticaria    Uterine fibroid    no problems from the fibroids   Varicose veins     Family History  Problem Relation Age of Onset   Arthritis Other    Heart disease Other    Heart disease Father    High blood pressure Father    Colon cancer Paternal Uncle     Past Surgical History:  Procedure Laterality Date   CARPAL TUNNEL RELEASE     bilateral   COLONOSCOPY WITH PROPOFOL N/A 03/23/2020   Procedure: COLONOSCOPY WITH PROPOFOL;  Surgeon: Lanelle Bal, DO;  [facility];  Service: Endoscopy;  Laterality: N/A;  1:30pm   DILATATION & CURETTAGE/HYSTEROSCOPY WITH MYOSURE N/A 09/11/2018   Procedure: DILATATION & CURETTAGE/HYSTEROSCOPY WITH MYOSURE;  Surgeon: Reva Bores, MD;  Location: [facility];  Service: Gynecology;  Laterality: N/A;   HEMORROIDECTOMY     HERNIA REPAIR  1975   umbilical hernia repair   JOINT REPLACEMENT     KNEE SURGERY     bilateral knee arthroscopy- rt knee sept 2011, left knee march 2010   ROTATOR CUFF REPAIR     TOTAL KNEE ARTHROPLASTY Right 10/21/2013   Procedure: RIGHT TOTAL KNEE ARTHROPLASTY;  Surgeon: Loanne Drilling, MD;  Location[facility];  Service: Orthopedics;  Laterality: Right;   Social History   Occupational History   Occupation: Set designer  Tobacco Use   Smoking status: Never   Smokeless tobacco: Never  Vaping Use   Vaping Use: Never used  Substance and Sexual Activity   Alcohol use: No   Drug use: No   Sexual activity: Yes

## 2022-10-21 ENCOUNTER — Ambulatory Visit: Payer: Medicare HMO | Admitting: Orthopedic Surgery

## 2022-10-21 ENCOUNTER — Encounter: Payer: Self-pay | Admitting: Orthopedic Surgery

## 2022-10-21 DIAGNOSIS — M65331 Trigger finger, right middle finger: Secondary | ICD-10-CM

## 2022-10-21 NOTE — Progress Notes (Signed)
Chief Complaint  Patient presents with   Trigger finger    RT ring finger Follow up Has improved since injection   Susan Oneill had catching locking of the right ring finger and is status post an injection  She is doing well without any catching or locking at this time no tenderness noted on exam  Follow-up as needed

## 2022-10-28 ENCOUNTER — Ambulatory Visit (INDEPENDENT_AMBULATORY_CARE_PROVIDER_SITE_OTHER): Payer: Medicare HMO

## 2022-10-28 VITALS — Ht 65.0 in | Wt 220.0 lb

## 2022-10-28 DIAGNOSIS — Z1231 Encounter for screening mammogram for malignant neoplasm of breast: Secondary | ICD-10-CM

## 2022-10-28 DIAGNOSIS — Z0000 Encounter for general adult medical examination without abnormal findings: Secondary | ICD-10-CM | POA: Diagnosis not present

## 2022-10-28 DIAGNOSIS — Z780 Asymptomatic menopausal state: Secondary | ICD-10-CM

## 2022-10-28 DIAGNOSIS — Z13820 Encounter for screening for osteoporosis: Secondary | ICD-10-CM

## 2022-10-28 NOTE — Patient Instructions (Signed)
[name] , Thank you for taking time to come for your Medicare Wellness Visit. I appreciate your ongoing commitment to your health goals. Please review the following plan we discussed and let me know if I can assist you in the future.   These are the goals we discussed:  Goals      Exercise 3x per week (30 min per time)     Remain active and independent     Weight (lb) < 200 lb (90.7 kg)        This is a list of the screening recommended for you and due dates:  Health Maintenance  Topic Date Due   DTaP/Tdap/Td vaccine (1 - Tdap) Never done   COVID-19 Vaccine (3 - Moderna risk series) 07/13/2020   Mammogram  12/07/2022   Flu Shot  02/02/2023   Medicare Annual Wellness Visit  10/28/2023   Colon Cancer Screening  03/23/2030   Pneumonia Vaccine  Completed   DEXA scan (bone density measurement)  Completed   Hepatitis C Screening: USPSTF Recommendation to screen - Ages 12-79 yo.  Completed   Zoster (Shingles) Vaccine  Completed   HPV Vaccine  Aged Out    Advanced directives: Forms are available if you choose in the future to pursue completion.  This is recommended in order to make sure that your health wishes are honored in the event that you are unable to verbalize them to the provider.    Conditions/risks identified: Aim for 30 minutes of exercise or brisk walking, 6-8 glasses of water, and 5 servings of fruits and vegetables each day.   Next appointment: Follow up in one year for your annual wellness visit    Preventive Care 65 Years and Older, Female Preventive care refers to lifestyle choices and visits with your health care provider that can promote health and wellness. What does preventive care include? A yearly physical exam. This is also called an annual well check. Dental exams once or twice a year. Routine eye exams. Ask your health care provider how often you should have your eyes checked. Personal lifestyle choices, including: Daily care of your teeth and  gums. Regular physical activity. Eating a healthy diet. Avoiding tobacco and drug use. Limiting alcohol use. Practicing safe sex. Taking low-dose aspirin every day. Taking vitamin and mineral supplements as recommended by your health care provider. What happens during an annual well check? The services and screenings done by your health care provider during your annual well check will depend on your age, overall health, lifestyle risk factors, and family history of disease. Counseling  Your health care provider may ask you questions about your: Alcohol use. Tobacco use. Drug use. Emotional well-being. Home and relationship well-being. Sexual activity. Eating habits. History of falls. Memory and ability to understand (cognition). Work and work Astronomer. Reproductive health. Screening  You may have the following tests or measurements: Height, weight, and BMI. Blood pressure. Lipid and cholesterol levels. These may be checked every 5 years, or more frequently if you are over 12 years old. Skin check. Lung cancer screening. You may have this screening every year starting at age 64 if you have a 30-pack-year history of smoking and currently smoke or have quit within the past 15 years. Fecal occult blood test (FOBT) of the stool. You may have this test every year starting at age 71. Flexible sigmoidoscopy or colonoscopy. You may have a sigmoidoscopy every 5 years or a colonoscopy every 10 years starting at age 33. Hepatitis C blood test.  Hepatitis B blood test. Sexually transmitted disease (STD) testing. Diabetes screening. This is done by checking your blood sugar (glucose) after you have not eaten for a while (fasting). You may have this done every 1-3 years. Bone density scan. This is done to screen for osteoporosis. You may have this done starting at age 38. Mammogram. This may be done every 1-2 years. Talk to your health care provider about how often you should have regular  mammograms. Talk with your health care provider about your test results, treatment options, and if necessary, the need for more tests. Vaccines  Your health care provider may recommend certain vaccines, such as: Influenza vaccine. This is recommended every year. Tetanus, diphtheria, and acellular pertussis (Tdap, Td) vaccine. You may need a Td booster every 10 years. Zoster vaccine. You may need this after age 87. Pneumococcal 13-valent conjugate (PCV13) vaccine. One dose is recommended after age 36. Pneumococcal polysaccharide (PPSV23) vaccine. One dose is recommended after age 63. Talk to your health care provider about which screenings and vaccines you need and how often you need them. This information is not intended to replace advice given to you by your health care provider. Make sure you discuss any questions you have with your health care provider. Document Released: 07/17/2015 Document Revised: 03/09/2016 Document Reviewed: 04/21/2015 Elsevier Interactive Patient Education  2017 [employer] Prevention in the Home Falls can cause injuries. They can happen to people of all ages. There are many things you can do to make your home safe and to help prevent falls. What can I do on the outside of my home? Regularly fix the edges of walkways and driveways and fix any cracks. Remove anything that might make you trip as you walk through a door, such as a raised step or threshold. Trim any bushes or trees on the path to your home. Use bright outdoor lighting. Clear any walking paths of anything that might make someone trip, such as rocks or tools. Regularly check to see if handrails are loose or broken. Make sure that both sides of any steps have handrails. Any raised decks and porches should have guardrails on the edges. Have any leaves, snow, or ice cleared regularly. Use sand or salt on walking paths during winter. Clean up any spills in your garage right away. This includes oil  or grease spills. What can I do in the bathroom? Use night lights. Install grab bars by the toilet and in the tub and shower. Do not use towel bars as grab bars. Use non-skid mats or decals in the tub or shower. If you need to sit down in the shower, use a plastic, non-slip stool. Keep the floor dry. Clean up any water that spills on the floor as soon as it happens. Remove soap buildup in the tub or shower regularly. Attach bath mats securely with double-sided non-slip rug tape. Do not have throw rugs and other things on the floor that can make you trip. What can I do in the bedroom? Use night lights. Make sure that you have a light by your bed that is easy to reach. Do not use any sheets or blankets that are too big for your bed. They should not hang down onto the floor. Have a firm chair that has side arms. You can use this for support while you get dressed. Do not have throw rugs and other things on the floor that can make you trip. What can I do in the kitchen? Clean up  any spills right away. Avoid walking on wet floors. Keep items that you use a lot in easy-to-reach places. If you need to reach something above you, use a strong step stool that has a grab bar. Keep electrical cords out of the way. Do not use floor polish or wax that makes floors slippery. If you must use wax, use non-skid floor wax. Do not have throw rugs and other things on the floor that can make you trip. What can I do with my stairs? Do not leave any items on the stairs. Make sure that there are handrails on both sides of the stairs and use them. Fix handrails that are broken or loose. Make sure that handrails are as long as the stairways. Check any carpeting to make sure that it is firmly attached to the stairs. Fix any carpet that is loose or worn. Avoid having throw rugs at the top or bottom of the stairs. If you do have throw rugs, attach them to the floor with carpet tape. Make sure that you have a light  switch at the top of the stairs and the bottom of the stairs. If you do not have them, ask someone to add them for you. What else can I do to help prevent falls? Wear shoes that: Do not have high heels. Have rubber bottoms. Are comfortable and fit you well. Are closed at the toe. Do not wear sandals. If you use a stepladder: Make sure that it is fully opened. Do not climb a closed stepladder. Make sure that both sides of the stepladder are locked into place. Ask someone to hold it for you, if possible. Clearly mark and make sure that you can see: Any grab bars or handrails. First and last steps. Where the edge of each step is. Use tools that help you move around (mobility aids) if they are needed. These include: Canes. Walkers. Scooters. Crutches. Turn on the lights when you go into a dark area. Replace any light bulbs as soon as they burn out. Set up your furniture so you have a clear path. Avoid moving your furniture around. If any of your floors are uneven, fix them. If there are any pets around you, be aware of where they are. Review your medicines with your doctor. Some medicines can make you feel dizzy. This can increase your chance of falling. Ask your doctor what other things that you can do to help prevent falls. This information is not intended to replace advice given to you by your health care provider. Make sure you discuss any questions you have with your health care provider. Document Released: 04/16/2009 Document Revised: 11/26/2015 Document Reviewed: 07/25/2014 Elsevier Interactive Patient Education  2017 [employer].

## 2022-10-28 NOTE — Progress Notes (Signed)
Subjective:   Susan Oneill is a 72 y.o. female who presents for Medicare Annual (Subsequent) preventive examination.  Review of Systems     Cardiac Risk Factors include: advanced age (>92men, >88 women);hypertension     Objective:    Today's Vitals   10/28/22 1436  Weight: 220 lb (99.8 kg)  Height: 5\' 5"  (1.651 m)   Body mass index is 36.61 kg/m.     10/28/2022    2:37 PM 10/19/2021    1:18 PM 10/13/2020    9:05 AM 03/23/2020   12:02 PM 09/11/2018    8:01 AM 09/06/2018    3:18 PM 10/21/2013    2:37 PM  Advanced Directives  Does Patient Have a Medical Advance Directive? No No No No No No Patient does not have advance directive;Patient would not like information  Would patient like information on creating a medical advance directive? No - Patient declined No - Patient declined No - Patient declined No - Patient declined No - Patient declined No - Patient declined   Pre-existing out of facility DNR order (yellow form or pink MOST form)       No    Current Medications (verified) Outpatient Encounter Medications as of 10/28/2022  Medication Sig   ALPRAZolam (XANAX) 0.5 MG tablet TAKE 1/2- TO 1 TABLET BY MOUTH TWO TIMES A DAY AS NEEDED   amoxicillin (AMOXIL) 500 MG capsule Take 2,000 mg by mouth once as needed (Before dental procedures).   escitalopram (LEXAPRO) 10 MG tablet TAKE 1 TABLET EVERY DAY   esomeprazole (NEXIUM) 20 MG capsule Take 20 mg by mouth daily before breakfast.    hydrochlorothiazide (HYDRODIURIL) 25 MG tablet Take 1 tablet (25 mg total) by mouth daily.   lisinopril (ZESTRIL) 40 MG tablet Take 1 tablet (40 mg total) by mouth every evening.   naproxen (NAPROSYN) 500 MG tablet TAKE ONE TABLET TWICE DAILY WITH A MEAL AS NEEDED FOR PAIN   rosuvastatin (CRESTOR) 5 MG tablet TAKE 1 TABLET EVERY DAY   traMADol (ULTRAM) 50 MG tablet TAKE 1 TABLET 2 TIMES DAILY AS NEEDED   No facility-administered encounter medications on file as of 10/28/2022.    Allergies  (verified) Sulfa antibiotics, Amoxicillin, Augmentin [amoxicillin-pot clavulanate], Ciprofloxacin, Codeine, Latex, Mobic [meloxicam], and Sulfonamide derivatives   History: Past Medical History:  Diagnosis Date   Acid reflux    Anxiety    xanax as needed   Anxiousness 09/25/2019   Arthritis    oa and pain both knees   Atypical chest pain 11/2012   DISCHARGE SUMMARY [facility] - RULLED OUT FOR MI, NEGATIVE NUCLEAR STRESS TEST - PAIN ATTRIBUTED TO PT'S GERD- PT STATES NO FURTHER CHEST PAINS - NEXIUM HAS HELPED PAIN   Cyst in hand    ?? CYST BACK OF LEFT HAND - PT STATES THE AREA POPPED UP - RAISED AREA - SOMETIMES PAINFUL - HAS A BRACE TO WEAR ON THAT HAND WHEN SLEEPING   Hemorrhoids    HTN (hypertension)    Hyperlipidemia    Tinnitus    Transfusion history 1978   after childbirth - not all of placenta passed and pt started bleeding   Urticaria    Uterine fibroid    no problems from the fibroids   Varicose veins    Past Surgical History:  Procedure Laterality Date   CARPAL TUNNEL RELEASE     bilateral   COLONOSCOPY WITH PROPOFOL N/A 03/23/2020   Procedure: COLONOSCOPY WITH PROPOFOL;  Surgeon: Lanelle Bal, DO;  Location:  [facility];  Service: Endoscopy;  Laterality: N/A;  1:30pm   DILATATION & CURETTAGE/HYSTEROSCOPY WITH MYOSURE N/A 09/11/2018   Procedure: DILATATION & CURETTAGE/HYSTEROSCOPY WITH MYOSURE;  Surgeon: Reva Bores, MD;  Location: [facility];  Service: Gynecology;  Laterality: N/A;   HEMORROIDECTOMY     HERNIA REPAIR  1975   umbilical hernia repair   JOINT REPLACEMENT     KNEE SURGERY     bilateral knee arthroscopy- rt knee sept 2011, left knee march 2010   ROTATOR CUFF REPAIR     TOTAL KNEE ARTHROPLASTY Right 10/21/2013   Procedure: RIGHT TOTAL KNEE ARTHROPLASTY;  Surgeon: Loanne Drilling, MD;  Location: [facility];  Service: Orthopedics;  Laterality: Right;   TUBAL LIGATION     Family History  Problem Relation Age of Onset   Arthritis Other     Heart disease Other    Heart disease Father    High blood pressure Father    Stroke Father    Colon cancer Paternal Uncle    Arthritis Mother    Varicose Veins Mother    Social History   Socioeconomic History   Marital status: Widowed    Spouse name: Not on file   Number of children: Not on file   Years of education: 12   Highest education level: Not on file  Occupational History   Occupation: Set designer  Tobacco Use   Smoking status: Never   Smokeless tobacco: Never  Vaping Use   Vaping Use: Never used  Substance and Sexual Activity   Alcohol use: No   Drug use: No   Sexual activity: Not Currently    Birth control/protection: Post-menopausal, Surgical  Other Topics Concern   Not on file  Social History Narrative   Not on file   Social Determinants of Health   Financial Resource Strain: High Risk (10/28/2022)   Overall Financial Resource Strain (CARDIA)    Difficulty of Paying Living Expenses: Hard  Food Insecurity: No Food Insecurity (10/28/2022)   Hunger Vital Sign    Worried About Running Out of Food in the Last Year: Never true    Ran Out of Food in the Last Year: Never true  Transportation Needs: No Transportation Needs (10/28/2022)   PRAPARE - Administrator, Civil Service (Medical): No    Lack of Transportation (Non-Medical): No  Physical Activity: Insufficiently Active (10/28/2022)   Exercise Vital Sign    Days of Exercise per Week: 3 days    Minutes of Exercise per Session: 30 min  Stress: No Stress Concern Present (10/28/2022)   [employer] of Occupational Health - Occupational Stress Questionnaire    Feeling of Stress : Not at all  Social Connections: Socially Isolated (10/28/2022)   Social Connection and Isolation Panel [NHANES]    Frequency of Communication with Friends and Family: More than three times a week    Frequency of Social Gatherings with Friends and Family: More than three times a week    Attends Religious Services:  Never    Database administrator or Organizations: No    Attends Banker Meetings: Never    Marital Status: Widowed    Tobacco Counseling Counseling given: Not Answered   Clinical Intake:  Pre-visit preparation completed: Yes  Pain : No/denies pain  Diabetes: No  How often do you need to have someone help you when you read instructions, pamphlets, or other written materials from your doctor or pharmacy?: 1 - Never  Diabetic?No  Interpreter Needed?: No  Information entered by :: Kandis Fantasia LPN   Activities of Daily Living    10/28/2022    2:33 PM 10/25/2022    8:46 PM  In your present state of health, do you have any difficulty performing the following activities:  Hearing? 0 0  Vision? 0 0  Difficulty concentrating or making decisions? 0 0  Walking or climbing stairs? 1 1  Dressing or bathing? 0 0  Doing errands, shopping? 0 0  Preparing Food and eating ? N N  Using the Toilet? N N  In the past six months, have you accidently leaked urine? N N  Do you have problems with loss of bowel control? N N  Managing your Medications? N N  Managing your Finances? N N  Housekeeping or managing your Housekeeping? N N    Patient Care Team: Babs Sciara, MD as PCP - General (Family Medicine) Gavin Pound, [facility] (Inactive) (Pharmacist)  Indicate any recent Medical Services you may have received from other than [facility] providers in the past year (date may be approximate).     Assessment:   This is a routine wellness examination for [facility].  Hearing/Vision screen Hearing Screening - Comments:: Denies hearing difficulties   Vision Screening - Comments:: Wears rx glasses - up to date with routine eye exams with [facility]    Dietary issues and exercise activities discussed: Current Exercise Habits: Home exercise routine, Type of exercise: walking, Time (Minutes): 30, Frequency (Times/Week): 5, Weekly Exercise (Minutes/Week): 150, Intensity:  Mild   Goals Addressed             This Visit's Progress    COMPLETED: Medication Management       Patient Goals/Self-Care Activities Over the next 90 days, patient will:  Take medications as prescribed Check blood pressure at least once daily, document, and provide at future appointments Engage in dietary modifications by less frequent dining out and decreased fat intake Start walking or exercising more frequently        Remain active and independent        Depression Screen    10/28/2022    2:38 PM 08/04/2022    9:24 AM 01/03/2022    1:58 PM 10/19/2021    1:10 PM 07/06/2021    2:59 PM 12/22/2020    5:06 PM 10/13/2020    9:07 AM  PHQ 2/9 Scores  PHQ - 2 Score 0 0 1 0 1 6 0  PHQ- 9 Score  1 2  3 14  0    Fall Risk    10/28/2022    2:33 PM 10/25/2022    8:46 PM 08/04/2022    9:23 AM 10/19/2021    1:19 PM 10/18/2021   11:39 PM  Fall Risk   Falls in the past year? 0 0 0 0 0  Number falls in past yr: 0 0 0 0 0  Injury with Fall? 0 0 0 0 0  Risk for fall due to : No Fall Risks   No Fall Risks   Follow up Falls prevention discussed;Education provided;Falls evaluation completed   Falls prevention discussed     FALL RISK PREVENTION PERTAINING TO THE HOME:  Any stairs in or around the home? No  If so, are there any without handrails? No  Home free of loose throw rugs in walkways, pet beds, electrical cords, etc? Yes  Adequate lighting in your home to reduce risk of falls? Yes   ASSISTIVE DEVICES UTILIZED TO PREVENT FALLS:  Life alert? No  Use of a cane, walker or w/c? No  Grab bars in the bathroom? Yes  Shower chair or bench in shower? No  Elevated toilet seat or a handicapped toilet? Yes   TIMED UP AND GO:  Was the test performed? No . Telephonic visit   Cognitive Function:        10/28/2022    2:37 PM 10/19/2021    1:21 PM  6CIT Screen  What Year? 0 points 0 points  What month? 0 points 0 points  What time? 0 points 0 points  Count back from 20 0 points 0  points  Months in reverse 0 points 0 points  Repeat phrase 0 points 0 points  Total Score 0 points 0 points    Immunizations Immunization History  Administered Date(s) Administered   Fluad Quad(high Dose 65+) 03/28/2019, 04/10/2020, 03/05/2021, 03/29/2022   Influenza-Unspecified 05/04/2018   Moderna SARS-COV2 Booster Vaccination 06/15/2020   Moderna Sars-Covid-2 Vaccination 08/16/2019, 09/13/2019   Pneumococcal Conjugate-13 11/27/2018   Pneumococcal Polysaccharide-23 04/10/2020   Zoster Recombinat (Shingrix) 03/22/2019, 04/04/2019, 06/04/2019    TDAP status: Due, Education has been provided regarding the importance of this vaccine. Advised may receive this vaccine at local pharmacy or Health Dept. Aware to provide a copy of the vaccination record if obtained from local pharmacy or Health Dept. Verbalized acceptance and understanding.  Flu Vaccine status: Up to date  Pneumococcal vaccine status: Up to date  Covid-19 vaccine status: Information provided on how to obtain vaccines.   Qualifies for Shingles Vaccine? Yes   Zostavax completed No   Shingrix Completed?: Yes  Screening Tests Health Maintenance  Topic Date Due   DTaP/Tdap/Td (1 - Tdap) Never done   COVID-19 Vaccine (3 - Moderna risk series) 07/13/2020   MAMMOGRAM  12/07/2022   INFLUENZA VACCINE  02/02/2023   Medicare Annual Wellness (AWV)  10/28/2023   COLONOSCOPY (Pts 45-39yrs Insurance coverage will need to be confirmed)  03/23/2030   Pneumonia Vaccine 33+ Years old  Completed   DEXA SCAN  Completed   Hepatitis C Screening  Completed   Zoster Vaccines- Shingrix  Completed   HPV VACCINES  Aged Out    Health Maintenance  Health Maintenance Due  Topic Date Due   DTaP/Tdap/Td (1 - Tdap) Never done   COVID-19 Vaccine (3 - Moderna risk series) 07/13/2020    Colorectal cancer screening: Type of screening: Colonoscopy. Completed 03/23/20. Repeat every 10 years  Mammogram status: Completed 12/06/21. Repeat every  year (ordered today)  Bone Density status: Completed 11/11/20. Results reflect: Bone density results: OSTEOPENIA. Repeat every 2 years. (Ordered today)  Lung Cancer Screening: (Low Dose CT Chest recommended if Age 23-80 years, 30 pack-year currently smoking OR have quit w/in 15years.) does not qualify.   Lung Cancer Screening Referral: n/a  Additional Screening:  Hepatitis C Screening: does qualify; Completed 07/15/17  Vision Screening: Recommended annual ophthalmology exams for early detection of glaucoma and other disorders of the eye. Is the patient up to date with their annual eye exam?  Yes  Who is the provider or what is the name of the office in which the patient attends annual eye exams? [facility]  If pt is not established with a provider, would they like to be referred to a provider to establish care? No .   Dental Screening: Recommended annual dental exams for proper oral hygiene  Community Resource Referral / Chronic Care Management: CRR required this visit?  No   CCM required this visit?  No      Plan:     I have personally reviewed and noted the following in the patient's chart:   Medical and social history Use of alcohol, tobacco or illicit drugs  Current medications and supplements including opioid prescriptions. Patient is not currently taking opioid prescriptions. Functional ability and status Nutritional status Physical activity Advanced directives List of other physicians Hospitalizations, surgeries, and ER visits in previous 12 months Vitals Screenings to include cognitive, depression, and falls Referrals and appointments  In addition, I have reviewed and discussed with patient certain preventive protocols, quality metrics, and best practice recommendations. A written personalized care plan for preventive services as well as general preventive health recommendations were provided to patient.     Durwin Nora, [place]   [birth year]40   Due to  this being a virtual visit, the after visit summary with patients personalized plan was offered to patient via mail or my-chart. Patient would like to access on my-chart  Nurse Notes: No concerns

## 2022-11-04 NOTE — Progress Notes (Signed)
I connected with  Susan Oneill on 10/28/22 by a audio enabled telemedicine application and verified that I am speaking with the correct person using two identifiers.  Patient Location: Home  Provider Location: Office/Clinic  I discussed the limitations of evaluation and management by telemedicine. The patient expressed understanding and agreed to proceed.

## 2022-11-06 ENCOUNTER — Other Ambulatory Visit: Payer: Self-pay | Admitting: Family Medicine

## 2022-11-16 ENCOUNTER — Other Ambulatory Visit: Payer: Self-pay | Admitting: Family Medicine

## 2022-11-16 DIAGNOSIS — E785 Hyperlipidemia, unspecified: Secondary | ICD-10-CM

## 2022-11-30 DIAGNOSIS — H524 Presbyopia: Secondary | ICD-10-CM | POA: Diagnosis not present

## 2022-12-26 ENCOUNTER — Other Ambulatory Visit: Payer: Self-pay | Admitting: Family Medicine

## 2023-01-02 ENCOUNTER — Ambulatory Visit (HOSPITAL_COMMUNITY): Payer: Medicare HMO

## 2023-01-02 ENCOUNTER — Other Ambulatory Visit (HOSPITAL_COMMUNITY): Payer: Medicare HMO

## 2023-01-18 ENCOUNTER — Other Ambulatory Visit (HOSPITAL_COMMUNITY): Payer: Medicare HMO

## 2023-01-18 ENCOUNTER — Ambulatory Visit (HOSPITAL_COMMUNITY): Payer: Medicare HMO

## 2023-02-02 ENCOUNTER — Ambulatory Visit: Payer: Medicare HMO | Admitting: Family Medicine

## 2023-02-13 ENCOUNTER — Ambulatory Visit (HOSPITAL_COMMUNITY)
Admission: RE | Admit: 2023-02-13 | Discharge: 2023-02-13 | Disposition: A | Discharge status: Home / Self Care | Payer: Medicare HMO | Source: Ambulatory Visit | Attending: Family Medicine | Admitting: Family Medicine

## 2023-02-13 DIAGNOSIS — Z780 Asymptomatic menopausal state: Secondary | ICD-10-CM

## 2023-02-13 DIAGNOSIS — Z1231 Encounter for screening mammogram for malignant neoplasm of breast: Secondary | ICD-10-CM | POA: Insufficient documentation

## 2023-02-13 DIAGNOSIS — M85832 Other specified disorders of bone density and structure, left forearm: Secondary | ICD-10-CM | POA: Diagnosis not present

## 2023-02-13 DIAGNOSIS — Z13820 Encounter for screening for osteoporosis: Secondary | ICD-10-CM

## 2023-02-13 DIAGNOSIS — M85851 Other specified disorders of bone density and structure, right thigh: Secondary | ICD-10-CM | POA: Diagnosis not present

## 2023-02-14 ENCOUNTER — Ambulatory Visit (INDEPENDENT_AMBULATORY_CARE_PROVIDER_SITE_OTHER): Payer: Medicare HMO | Admitting: Family Medicine

## 2023-02-14 ENCOUNTER — Encounter: Payer: Self-pay | Admitting: Family Medicine

## 2023-02-14 VITALS — BP 109/65 | HR 68 | Temp 98.0°F | Ht 65.0 in | Wt 219.0 lb

## 2023-02-14 DIAGNOSIS — E785 Hyperlipidemia, unspecified: Secondary | ICD-10-CM

## 2023-02-14 DIAGNOSIS — J069 Acute upper respiratory infection, unspecified: Secondary | ICD-10-CM | POA: Diagnosis not present

## 2023-02-14 DIAGNOSIS — F439 Reaction to severe stress, unspecified: Secondary | ICD-10-CM

## 2023-02-14 DIAGNOSIS — I10 Essential (primary) hypertension: Secondary | ICD-10-CM | POA: Diagnosis not present

## 2023-02-14 MED ORDER — HYDROCHLOROTHIAZIDE 25 MG PO TABS: 25.0000 mg | ORAL_TABLET | Freq: Every day | ORAL | 1 refills | Status: DC

## 2023-02-14 MED ORDER — TRAMADOL HCL 50 MG PO TABS: ORAL_TABLET | ORAL | 4 refills | Status: DC

## 2023-02-14 MED ORDER — LISINOPRIL 20 MG PO TABS: 20.0000 mg | ORAL_TABLET | Freq: Every evening | ORAL | 1 refills | Status: DC

## 2023-02-14 NOTE — Progress Notes (Signed)
   Subjective:    Patient ID: Susan Oneill, female    DOB: 09-Jan-1951, 72 y.o.   MRN: [id number]  HPI Follow up for HTN , cholesterol, Gerd Patient for blood pressure check up.  The patient does have hypertension.   Patient relates dietary measures try to minimize salt The importance of healthy diet and activity were discussed Patient relates compliance  Patient here for follow-up regarding cholesterol.    Patient relates taking medication on a regular basis Denies problems with medication Importance of dietary measures discussed Regular lab work regarding lipid and liver was checked and if needing additional labs was appropriately ordered  Mild obesity patient encouraged to work hard at diet healthy choices regular physical activity.  Try to keep weight and check.  Concern w/ scratchy throat, and cough started since her recent travels to [place] dessert environment     Review of Systems     Objective:   Physical Exam General-in no acute distress Eyes-no discharge Lungs-respiratory rate normal, CTA CV-no murmurs,RRR Extremities skin warm dry no edema Neuro grossly normal Behavior normal, alert        Assessment & Plan:  1. Primary hypertension Her blood pressure is actually lower than what I would expect I think it is safe for her to reduce her lisinopril from 40 mg down to 20 mg.  Keep everything else the same healthy diet regular activity  2. Hyperlipidemia, unspecified hyperlipidemia type Previous labs look good we will do labs before next visit  3. Upper respiratory tract infection, unspecified type Respiratory illness I do not feel she needs an antibiotic I hope this will run its course over the next 1 to 2 weeks if it is persistent or worsening she is to notify us it is quite possible this is related to environmental changes from being in [place] or a viral illness  4. Stress She is under a lot of stress and having to take care of her older brother who has  COPD and on oxygen I have encouraged her to have her brother look into other resources to help him with his care through the [place] or through [place]  5. Morbid obesity (HCC) Portion control regular physical activity  Follow-up in approximately 6 months lab work before that visit

## 2023-02-16 ENCOUNTER — Other Ambulatory Visit: Payer: Self-pay

## 2023-02-16 DIAGNOSIS — E785 Hyperlipidemia, unspecified: Secondary | ICD-10-CM

## 2023-02-16 DIAGNOSIS — I10 Essential (primary) hypertension: Secondary | ICD-10-CM

## 2023-02-16 DIAGNOSIS — Z79899 Other long term (current) drug therapy: Secondary | ICD-10-CM

## 2023-04-23 ENCOUNTER — Encounter: Payer: Self-pay | Admitting: Family Medicine

## 2023-04-23 NOTE — Progress Notes (Signed)
Please mail to patient

## 2023-05-24 ENCOUNTER — Other Ambulatory Visit: Payer: Self-pay | Admitting: Family Medicine

## 2023-07-07 ENCOUNTER — Other Ambulatory Visit: Payer: Self-pay | Admitting: Family Medicine

## 2023-08-10 DIAGNOSIS — B5801 Toxoplasma chorioretinitis: Secondary | ICD-10-CM | POA: Diagnosis not present

## 2023-08-10 DIAGNOSIS — H35363 Drusen (degenerative) of macula, bilateral: Secondary | ICD-10-CM | POA: Diagnosis not present

## 2023-08-10 DIAGNOSIS — H40013 Open angle with borderline findings, low risk, bilateral: Secondary | ICD-10-CM | POA: Diagnosis not present

## 2023-08-10 DIAGNOSIS — H43811 Vitreous degeneration, right eye: Secondary | ICD-10-CM | POA: Diagnosis not present

## 2023-08-10 DIAGNOSIS — H25813 Combined forms of age-related cataract, bilateral: Secondary | ICD-10-CM | POA: Diagnosis not present

## 2023-08-10 DIAGNOSIS — H524 Presbyopia: Secondary | ICD-10-CM | POA: Diagnosis not present

## 2023-08-15 ENCOUNTER — Other Ambulatory Visit: Payer: Self-pay | Admitting: Family Medicine

## 2023-08-15 NOTE — Telephone Encounter (Signed)
Copied from CRM [id number]. Topic: Clinical - Medication Refill >> Aug 15, 2023 12:09 PM Higinio Roger wrote: Most Recent Primary Care Visit:  Provider: Lilyan Punt A  Department: [facility]  Visit Type: OFFICE VISIT  Date: 02/14/2023  Medication: traMADol (ULTRAM) 50 MG tablet  Has the patient contacted their pharmacy? Yes (Agent: If no, request that the patient contact the pharmacy for the refill. If patient does not wish to contact the pharmacy document the reason why and proceed with request.) (Agent: If yes, when and what did the pharmacy advise?)  Pharmacy told patient to call the clinic to authorize early refill before patient leaves for vacation 08/29/2023.  Is this the correct pharmacy for this prescription? Yes  If no, delete pharmacy and type the correct one.  This is the patient's preferred pharmacy:   [facility] - [place], [place] - [address] [place] [address] Phone: [phone] Fax: [phone]   Has the prescription been filled recently? No  Is the patient out of the medication? No but may be out before returning. Patient takes medication as needed  Has the patient been seen for an appointment in the last year OR does the patient have an upcoming appointment? Yes  Can we respond through MyChart? Yes  Agent: Please be advised that Rx refills may take up to 3 business days. We ask that you follow-up with your pharmacy.

## 2023-08-15 NOTE — Telephone Encounter (Signed)
Last Fill: 02/14/23  Last OV: 02/14/23 Next OV: 10/23/23  Routing to provider for review/authorization.

## 2023-08-17 ENCOUNTER — Ambulatory Visit: Payer: Medicare HMO | Admitting: Family Medicine

## 2023-08-23 ENCOUNTER — Other Ambulatory Visit: Payer: Self-pay | Admitting: Family Medicine

## 2023-08-29 ENCOUNTER — Ambulatory Visit: Payer: Medicare HMO | Admitting: Family Medicine

## 2023-09-06 ENCOUNTER — Other Ambulatory Visit: Payer: Self-pay | Admitting: Family Medicine

## 2023-09-06 DIAGNOSIS — E785 Hyperlipidemia, unspecified: Secondary | ICD-10-CM

## 2023-10-18 ENCOUNTER — Other Ambulatory Visit: Payer: Self-pay | Admitting: Family Medicine

## 2023-10-23 ENCOUNTER — Encounter: Payer: Self-pay | Admitting: Family Medicine

## 2023-10-23 ENCOUNTER — Ambulatory Visit (INDEPENDENT_AMBULATORY_CARE_PROVIDER_SITE_OTHER): Payer: Medicare HMO | Admitting: Family Medicine

## 2023-10-23 VITALS — BP 132/75 | HR 65 | Temp 97.9°F | Ht 65.0 in | Wt 225.2 lb

## 2023-10-23 DIAGNOSIS — I10 Essential (primary) hypertension: Secondary | ICD-10-CM

## 2023-10-23 DIAGNOSIS — E785 Hyperlipidemia, unspecified: Secondary | ICD-10-CM | POA: Diagnosis not present

## 2023-10-23 DIAGNOSIS — Z79899 Other long term (current) drug therapy: Secondary | ICD-10-CM

## 2023-10-23 DIAGNOSIS — M7122 Synovial cyst of popliteal space [Baker], left knee: Secondary | ICD-10-CM

## 2023-10-23 MED ORDER — ESCITALOPRAM OXALATE 10 MG PO TABS: 10.0000 mg | ORAL_TABLET | Freq: Every day | ORAL | 1 refills | Status: DC

## 2023-10-23 MED ORDER — TRAMADOL HCL 50 MG PO TABS: 50.0000 mg | ORAL_TABLET | Freq: Two times a day (BID) | ORAL | 5 refills | Status: DC | PRN

## 2023-10-23 NOTE — Progress Notes (Signed)
   Subjective:    Patient ID: Susan Oneill, female    DOB: 1950/12/14, 73 y.o.   MRN: [id number]  HPI Pt comes in today for 6 month follow-up with no questions or concerns at this time. Medications and allergies have been reviewed.  Patient for blood pressure check up.  The patient does have hypertension.   Patient relates dietary measures try to minimize salt The importance of healthy diet and activity were discussed Patient relates compliance  Patient here for follow-up regarding cholesterol.    Patient relates taking medication on a regular basis Denies problems with medication Importance of dietary measures discussed Regular lab work regarding lipid and liver was checked and if needing additional labs was appropriately ordered  The patient's BMI is calculated.  The patient does have obesity.  The patient does try to some degree staying active and watching diet.  It is in the vital signs and acknowledged.  It is above the recommended BMI for the patient's height and weight.  The patient has been counseled regarding healthy diet, restricted portions, avoiding excessive carbohydrates/sugary foods, and increase physical activity as health permits.  It is in the patient's best interest to lower the risk of secondary illness including heart disease strokes and cancer by losing weight.  The patient acknowledges this information.  Patient relates that she is doing fairly well with stress her brother has some pretty severe illnesses.  Patient has significant arthritis of the joints causing her a lot of pain and discomfort using the tramadol  and a as needed basis  Uses Xanax  rarely  Review of Systems     Objective:   Physical Exam General-in no acute distress Eyes-no discharge Lungs-respiratory rate normal, CTA CV-no murmurs,RRR Extremities skin warm dry no edema Neuro grossly normal Behavior normal, alert Osteoarthritis of the knees        Assessment & Plan:   1.  Primary hypertension (Primary) HTN- patient seen for follow-up regarding HTN.   Diet, medication compliance, appropriate labs and refills were completed.   Importance of keeping blood pressure under good control to lessen the risk of complications discussed Regular follow-up visits discussed   2. Hyperlipidemia, unspecified hyperlipidemia type Hyperlipidemia-importance of diet, weight control, activity, compliance with medications discussed.   Recent labs reviewed.   Any additional labs or refills ordered.   Importance of keeping under good control discussed. Regular follow-up visits discussed   3. Synovial cyst of left popliteal space Mild arthralgia noted.  Complains of stiffness discomfort.  Uses tramadol  as needed.  Does take naproxen  twice daily for her joints.  As long as her kidney function looks good we will continue this.  Patient denies any abdominal symptoms.  Patient with morbid obesity portion control regular physical activity try to keep weight down  Family history diabetes check glucose on lab work  Labs ordered follow-up in 6 months

## 2023-10-31 DIAGNOSIS — I10 Essential (primary) hypertension: Secondary | ICD-10-CM | POA: Diagnosis not present

## 2023-10-31 DIAGNOSIS — E785 Hyperlipidemia, unspecified: Secondary | ICD-10-CM | POA: Diagnosis not present

## 2023-10-31 DIAGNOSIS — H52223 Regular astigmatism, bilateral: Secondary | ICD-10-CM | POA: Diagnosis not present

## 2023-10-31 DIAGNOSIS — H5203 Hypermetropia, bilateral: Secondary | ICD-10-CM | POA: Diagnosis not present

## 2023-10-31 DIAGNOSIS — Z79899 Other long term (current) drug therapy: Secondary | ICD-10-CM | POA: Diagnosis not present

## 2023-11-01 ENCOUNTER — Encounter: Payer: Self-pay | Admitting: Family Medicine

## 2023-11-01 LAB — HEPATIC FUNCTION PANEL
ALT: 7 IU/L (ref 0–32)
AST: 16 IU/L (ref 0–40)
Albumin: 4.3 g/dL (ref 3.9–4.9)
Alkaline Phosphatase: 71 IU/L (ref 44–121)
Bilirubin Total: 0.4 mg/dL (ref 0.0–1.2)
Bilirubin, Direct: 0.12 mg/dL (ref 0.00–0.40)
Total Protein: 6.4 g/dL (ref 6.0–8.5)

## 2023-11-01 LAB — BASIC METABOLIC PANEL WITH GFR
BUN/Creatinine Ratio: 22 (ref 12–28)
BUN: 20 mg/dL (ref 8–27)
CO2: 24 mmol/L (ref 20–29)
Calcium: 9.3 mg/dL (ref 8.7–10.3)
Chloride: 102 mmol/L (ref 96–106)
Creatinine, Ser: 0.9 mg/dL (ref 0.57–1.00)
Glucose: 80 mg/dL (ref 70–99)
Potassium: 4.9 mmol/L (ref 3.5–5.2)
Sodium: 141 mmol/L (ref 134–144)
eGFR: 69 mL/min/{1.73_m2} (ref >59)

## 2023-11-01 LAB — LIPID PANEL
Chol/HDL Ratio: 2.5 ratio (ref 0.0–4.4)
Cholesterol, Total: 153 mg/dL (ref 100–199)
HDL: 62 mg/dL (ref >39)
LDL Chol Calc (NIH): 74 mg/dL (ref 0–99)
Triglycerides: 93 mg/dL (ref 0–149)
VLDL Cholesterol Cal: 17 mg/dL (ref 5–40)

## 2023-11-01 LAB — MICROALBUMIN / CREATININE URINE RATIO
Creatinine, Urine: 101.1 mg/dL
Microalb/Creat Ratio: 17 mg/g{creat} (ref 0–29)
Microalbumin, Urine: 17.6 ug/mL

## 2023-11-01 NOTE — Progress Notes (Signed)
 Please mail to patient

## 2023-11-03 ENCOUNTER — Ambulatory Visit (INDEPENDENT_AMBULATORY_CARE_PROVIDER_SITE_OTHER)

## 2023-11-03 ENCOUNTER — Other Ambulatory Visit: Payer: Self-pay

## 2023-11-03 VITALS — Ht 65.0 in | Wt 225.0 lb

## 2023-11-03 DIAGNOSIS — Z0000 Encounter for general adult medical examination without abnormal findings: Secondary | ICD-10-CM

## 2023-11-03 DIAGNOSIS — Z79899 Other long term (current) drug therapy: Secondary | ICD-10-CM

## 2023-11-03 DIAGNOSIS — I10 Essential (primary) hypertension: Secondary | ICD-10-CM

## 2023-11-03 DIAGNOSIS — E785 Hyperlipidemia, unspecified: Secondary | ICD-10-CM

## 2023-11-03 NOTE — Patient Instructions (Signed)
 Ms. Kocourek , Thank you for taking time to come for your Medicare Wellness Visit. I appreciate your ongoing commitment to your health goals. Please review the following plan we discussed and let me know if I can assist you in the future.   Referrals/Orders/Follow-Ups/Clinician Recommendations: Aim for 30 minutes of exercise or brisk walking, 6-8 glasses of water , and 5 servings of fruits and vegetables each day.  This is a list of the screening recommended for you and due dates:  Health Maintenance  Topic Date Due   DTaP/Tdap/Td vaccine (1 - Tdap) Never done   COVID-19 Vaccine (3 - Moderna risk series) 01/30/2024*   Flu Shot  02/02/2024   Mammogram  02/13/2024   Medicare Annual Wellness Visit  11/02/2024   Colon Cancer Screening  03/23/2030   Pneumonia Vaccine  Completed   DEXA scan (bone density measurement)  Completed   Hepatitis C Screening  Completed   Zoster (Shingles) Vaccine  Completed   HPV Vaccine  Aged Out   Meningitis B Vaccine  Aged Out  *Topic was postponed. The date shown is not the original due date.    Advanced directives: (ACP Link)Information on Advanced Care Planning can be found at [place]  Secretary of [facility] Advance Health Care Directives Advance Health Care Directives. [email or web address]   Next Medicare Annual Wellness Visit scheduled for next year: Yes  Have you seen your provider in the last 6 months (3 months if uncontrolled diabetes)? Yes

## 2023-11-03 NOTE — Progress Notes (Signed)
 Subjective:   Susan Oneill is a 73 y.o. who presents for a Medicare Wellness preventive visit.  Visit Complete: Virtual I connected with  Susan Oneill on 11/03/23 by a audio enabled telemedicine application and verified that I am speaking with the correct person using two identifiers.  Patient Location: Home  Provider Location: Home Office  I discussed the limitations of evaluation and management by telemedicine. The patient expressed understanding and agreed to proceed.  Vital Signs: Because this visit was a virtual/telehealth visit, some criteria may be missing or patient reported. Any vitals not documented were not able to be obtained and vitals that have been documented are patient reported.  VideoDeclined- This patient declined Librarian, academic. Therefore the visit was completed with audio only.  Persons Participating in Visit: Patient.  AWV Questionnaire: Yes: Patient Medicare AWV questionnaire was completed by the patient on 11/02/23; I have confirmed that all information answered by patient is correct and no changes since this date.  Cardiac Risk Factors include: advanced age (>65men, >23 women);hypertension;dyslipidemia     Objective:    Today's Vitals   11/03/23 1444  Weight: 225 lb (102.1 kg)  Height: 5\' 5"  (1.651 m)   Body mass index is 37.44 kg/m.     11/03/2023    3:06 PM 10/28/2022    2:37 PM 10/19/2021    1:18 PM 10/13/2020    9:05 AM 03/23/2020   12:02 PM 09/11/2018    8:01 AM 09/06/2018    3:18 PM  Advanced Directives  Does Patient Have a Medical Advance Directive? No No No No No No No  Would patient like information on creating a medical advance directive? Yes (MAU/Ambulatory/Procedural Areas - Information given) No - Patient declined No - Patient declined No - Patient declined No - Patient declined No - Patient declined No - Patient declined    Current Medications (verified) Outpatient Encounter Medications as of  11/03/2023  Medication Sig   ALPRAZolam  (XANAX ) 0.5 MG tablet TAKE 1/2- TO 1 TABLET BY MOUTH TWO TIMES A DAY AS NEEDED   escitalopram  (LEXAPRO ) 10 MG tablet Take 1 tablet (10 mg total) by mouth daily.   esomeprazole  (NEXIUM ) 20 MG capsule Take 20 mg by mouth daily before breakfast.    hydrochlorothiazide  (HYDRODIURIL ) 25 MG tablet TAKE 1 TABLET EVERY DAY   lisinopril  (ZESTRIL ) 20 MG tablet TAKE 1 TABLET EVERY EVENING; NEW DOSE   naproxen  (NAPROSYN ) 500 MG tablet TAKE 1 TABLET TWICE DAILY WITH MEALS AS NEEDED FOR PAIN   rosuvastatin  (CRESTOR ) 5 MG tablet TAKE 1 TABLET EVERY DAY   traMADol  (ULTRAM ) 50 MG tablet Take 1 tablet (50 mg total) by mouth 2 (two) times daily as needed.   No facility-administered encounter medications on file as of 11/03/2023.    Allergies (verified) Sulfa antibiotics, Amoxicillin, Augmentin [amoxicillin-pot clavulanate], Ciprofloxacin, Codeine, Latex, Mobic  [meloxicam ], and Sulfonamide derivatives   History: Past Medical History:  Diagnosis Date   Acid reflux    Anxiety    xanax  as needed   Anxiousness 09/25/2019   Arthritis    oa and pain both knees   Atypical chest pain 11/2012   DISCHARGE SUMMARY [facility] - RULLED OUT FOR MI, NEGATIVE NUCLEAR STRESS TEST - PAIN ATTRIBUTED TO PT'S GERD- PT STATES NO FURTHER CHEST PAINS - NEXIUM  HAS HELPED PAIN   Cyst in hand    ?? CYST BACK OF LEFT HAND - PT STATES THE AREA POPPED UP - RAISED AREA - SOMETIMES PAINFUL - HAS A  BRACE TO WEAR ON THAT HAND WHEN SLEEPING   Hemorrhoids    HTN (hypertension)    Hyperlipidemia    Tinnitus    Transfusion history 1978   after childbirth - not all of placenta passed and pt started bleeding   Urticaria    Uterine fibroid    no problems from the fibroids   Varicose veins    Past Surgical History:  Procedure Laterality Date   CARPAL TUNNEL RELEASE     bilateral   COLONOSCOPY WITH PROPOFOL  N/A 03/23/2020   Procedure: COLONOSCOPY WITH PROPOFOL ;  Surgeon: Vinetta Greening, DO;   Location: [facility];  Service: Endoscopy;  Laterality: N/A;  1:30pm   DILATATION & CURETTAGE/HYSTEROSCOPY WITH MYOSURE N/A 09/11/2018   Procedure: DILATATION & CURETTAGE/HYSTEROSCOPY WITH MYOSURE;  Surgeon: Granville Layer, MD;  Location: [facility];  Service: Gynecology;  Laterality: N/A;   HEMORROIDECTOMY     HERNIA REPAIR  1975   umbilical hernia repair   JOINT REPLACEMENT     KNEE SURGERY     bilateral knee arthroscopy- rt knee sept 2011, left knee march 2010   ROTATOR CUFF REPAIR     TOTAL KNEE ARTHROPLASTY Right 10/21/2013   Procedure: RIGHT TOTAL KNEE ARTHROPLASTY;  Surgeon: Aurther Blue, MD;  Location: [facility];  Service: Orthopedics;  Laterality: Right;   TUBAL LIGATION     Family History  Problem Relation Age of Onset   Diabetes Mother    Arthritis Mother    Varicose Veins Mother    Heart disease Father    High blood pressure Father    Stroke Father    Hypertension Sister    Diabetes Sister    Obesity Sister    Hypertension Sister    Diabetes Sister    Arthritis Sister    Heart failure Brother    Hypertension Brother    COPD Brother    Atrial fibrillation Brother    Colon cancer Paternal Uncle    Arthritis Other    Heart disease Other    Arthritis Brother    Hypertension Brother    Intellectual disability Brother    Social History   Socioeconomic History   Marital status: Widowed    Spouse name: Not on file   Number of children: Not on file   Years of education: 12   Highest education level: 12th grade  Occupational History   Occupation: Set designer  Tobacco Use   Smoking status: Never   Smokeless tobacco: Never  Vaping Use   Vaping status: Never Used  Substance and Sexual Activity   Alcohol use: No   Drug use: Never   Sexual activity: Not Currently    Birth control/protection: Post-menopausal, Surgical  Other Topics Concern   Not on file  Social History Narrative   Not on file   Social Drivers of Health   Financial  Resource Strain: Low Risk  (11/03/2023)   Overall Financial Resource Strain (CARDIA)    Difficulty of Paying Living Expenses: Not hard at all  Recent Concern: Financial Resource Strain - High Risk (10/19/2023)   Overall Financial Resource Strain (CARDIA)    Difficulty of Paying Living Expenses: Hard  Food Insecurity: No Food Insecurity (11/03/2023)   Hunger Vital Sign    Worried About Running Out of Food in the Last Year: Never true    Ran Out of Food in the Last Year: Never true  Transportation Needs: No Transportation Needs (11/03/2023)   PRAPARE - Transportation    Lack  of Transportation (Medical): No    Lack of Transportation (Non-Medical): No  Physical Activity: Insufficiently Active (11/03/2023)   Exercise Vital Sign    Days of Exercise per Week: 3 days    Minutes of Exercise per Session: 30 min  Stress: No Stress Concern Present (11/03/2023)   [employer] of Occupational Health - Occupational Stress Questionnaire    Feeling of Stress : Not at all  Recent Concern: Stress - Stress Concern Present (10/19/2023)   [employer] of Occupational Health - Occupational Stress Questionnaire    Feeling of Stress : To some extent  Social Connections: Unknown (11/03/2023)   Social Connection and Isolation Panel [NHANES]    Frequency of Communication with Friends and Family: More than three times a week    Frequency of Social Gatherings with Friends and Family: Once a week    Attends Religious Services: Patient declined    Database administrator or Organizations: No    Attends Banker Meetings: Never    Marital Status: Widowed    Tobacco Counseling Counseling given: Not Answered    Clinical Intake:  Pre-visit preparation completed: Yes  Pain : No/denies pain     Diabetes: No  No results found for: "HGBA1C"   How often do you need to have someone help you when you read instructions, pamphlets, or other written materials from your doctor or pharmacy?: 1 -  Never  Interpreter Needed?: No  Information entered by :: Seabron Cypress LPN   Activities of Daily Living     11/02/2023    9:51 AM  In your present state of health, do you have any difficulty performing the following activities:  Hearing? 0  Vision? 0  Difficulty concentrating or making decisions? 0  Walking or climbing stairs? 1  Dressing or bathing? 0  Doing errands, shopping? 0  Preparing Food and eating ? N  Using the Toilet? N  In the past six months, have you accidently leaked urine? Y  Do you have problems with loss of bowel control? N  Managing your Medications? N  Managing your Finances? N  Housekeeping or managing your Housekeeping? N    Patient Care Team: Bennet Brasil, MD as PCP - General (Family Medicine) Cleophas Dadds, [facility] (Inactive) (Pharmacist) Patty, A. Hansel Ley, MD (Ophthalmology)  Indicate any recent Medical Services you may have received from other than [facility] providers in the past year (date may be approximate).     Assessment:   This is a routine wellness examination for Susan Oneill.  Hearing/Vision screen Hearing Screening - Comments:: Denies hearing difficulties   Vision Screening - Comments:: Wears rx glasses - up to date with routine eye exams with [facility]    Goals Addressed             This Visit's Progress    Remain active and independent   On track      Depression Screen     11/03/2023    3:00 PM 10/23/2023    8:13 AM 02/14/2023    8:46 AM 10/28/2022    2:38 PM 08/04/2022    9:24 AM 01/03/2022    1:58 PM 10/19/2021    1:10 PM  PHQ 2/9 Scores  PHQ - 2 Score 0 0 0 0 0 1 0  PHQ- 9 Score 2 2 2  1 2      Fall Risk     11/03/2023    3:03 PM 11/02/2023    9:51 AM 10/23/2023    8:13  AM 02/14/2023    8:46 AM 10/28/2022    2:33 PM  Fall Risk   Falls in the past year? 0 0 0 0 0  Number falls in past yr: 0 0  0 0  Injury with Fall? 0 0  0 0  Risk for fall due to : No Fall Risks   No Fall Risks No Fall Risks  Follow up Falls  prevention discussed;Education provided;Falls evaluation completed   Falls evaluation completed Falls prevention discussed;Education provided;Falls evaluation completed    MEDICARE RISK AT HOME:  Medicare Risk at Home Any stairs in or around the home?: (Patient-Rptd) Yes If so, are there any without handrails?: (Patient-Rptd) No Home free of loose throw rugs in walkways, pet beds, electrical cords, etc?: (Patient-Rptd) Yes Adequate lighting in your home to reduce risk of falls?: (Patient-Rptd) Yes Life alert?: (Patient-Rptd) No Use of a cane, walker or w/c?: (Patient-Rptd) No Grab bars in the bathroom?: (Patient-Rptd) Yes Shower chair or bench in shower?: (Patient-Rptd) No Elevated toilet seat or a handicapped toilet?: (Patient-Rptd) Yes  TIMED UP AND GO:  Was the test performed?  No  Cognitive Function: 6CIT completed        11/03/2023    3:05 PM 10/28/2022    2:37 PM 10/19/2021    1:21 PM  6CIT Screen  What Year? 0 points 0 points 0 points  What month? 0 points 0 points 0 points  What time? 0 points 0 points 0 points  Count back from 20 0 points 0 points 0 points  Months in reverse 0 points 0 points 0 points  Repeat phrase 0 points 0 points 0 points  Total Score 0 points 0 points 0 points    Immunizations Immunization History  Administered Date(s) Administered   Fluad Quad(high Dose 65+) 03/28/2019, 04/10/2020, 03/05/2021, 03/29/2022   Influenza-Unspecified 05/04/2018   Moderna SARS-COV2 Booster Vaccination 06/15/2020   Moderna Sars-Covid-2 Vaccination 08/16/2019, 09/13/2019   Pneumococcal Conjugate-13 11/27/2018   Pneumococcal Polysaccharide-23 04/10/2020   Zoster Recombinant(Shingrix) 03/22/2019, 04/04/2019, 06/04/2019    Screening Tests Health Maintenance  Topic Date Due   DTaP/Tdap/Td (1 - Tdap) Never done   COVID-19 Vaccine (3 - Moderna risk series) 01/30/2024 (Originally 07/13/2020)   INFLUENZA VACCINE  02/02/2024   MAMMOGRAM  02/13/2024   Medicare Annual  Wellness (AWV)  11/02/2024   Colonoscopy  03/23/2030   Pneumonia Vaccine 23+ Years old  Completed   DEXA SCAN  Completed   Hepatitis C Screening  Completed   Zoster Vaccines- Shingrix  Completed   HPV VACCINES  Aged Out   Meningococcal B Vaccine  Aged Out    Health Maintenance  Health Maintenance Due  Topic Date Due   DTaP/Tdap/Td (1 - Tdap) Never done    Additional Screening:  Vision Screening: Recommended annual ophthalmology exams for early detection of glaucoma and other disorders of the eye.  Dental Screening: Recommended annual dental exams for proper oral hygiene  Community Resource Referral / Chronic Care Management: CRR required this visit?  No   CCM required this visit?  No     Plan:     I have personally reviewed and noted the following in the patient's chart:   Medical and social history Use of alcohol, tobacco or illicit drugs  Current medications and supplements including opioid prescriptions. Patient is not currently taking opioid prescriptions. Functional ability and status Nutritional status Physical activity Advanced directives List of other physicians Hospitalizations, surgeries, and ER visits in previous 12 months Vitals Screenings to include  cognitive, depression, and falls Referrals and appointments  In addition, I have reviewed and discussed with patient certain preventive protocols, quality metrics, and best practice recommendations. A written personalized care plan for preventive services as well as general preventive health recommendations were provided to patient.     Seabron Cypress [place], [place]   07/09/1094   After Visit Summary: (MyChart) Due to this being a telephonic visit, the after visit summary with patients personalized plan was offered to patient via MyChart   Notes: Nothing significant to report at this time.

## 2023-12-07 DIAGNOSIS — H6123 Impacted cerumen, bilateral: Secondary | ICD-10-CM | POA: Diagnosis not present

## 2024-02-05 ENCOUNTER — Encounter (HOSPITAL_COMMUNITY): Payer: Self-pay | Admitting: Family Medicine

## 2024-02-05 DIAGNOSIS — Z1231 Encounter for screening mammogram for malignant neoplasm of breast: Secondary | ICD-10-CM

## 2024-02-15 ENCOUNTER — Other Ambulatory Visit (HOSPITAL_COMMUNITY): Payer: Self-pay | Admitting: Family Medicine

## 2024-02-15 DIAGNOSIS — Z1231 Encounter for screening mammogram for malignant neoplasm of breast: Secondary | ICD-10-CM

## 2024-02-19 ENCOUNTER — Ambulatory Visit (HOSPITAL_COMMUNITY)
Admission: RE | Admit: 2024-02-19 | Discharge: 2024-02-19 | Disposition: A | Discharge status: Home / Self Care | Source: Ambulatory Visit | Attending: Family Medicine | Admitting: Family Medicine

## 2024-02-19 ENCOUNTER — Encounter (HOSPITAL_COMMUNITY): Payer: Self-pay

## 2024-02-19 DIAGNOSIS — Z1231 Encounter for screening mammogram for malignant neoplasm of breast: Secondary | ICD-10-CM | POA: Diagnosis not present

## 2024-03-15 ENCOUNTER — Other Ambulatory Visit: Payer: Self-pay | Admitting: Family Medicine

## 2024-03-26 DIAGNOSIS — H6121 Impacted cerumen, right ear: Secondary | ICD-10-CM | POA: Diagnosis not present

## 2024-04-22 ENCOUNTER — Other Ambulatory Visit: Payer: Self-pay | Admitting: Family Medicine

## 2024-05-01 ENCOUNTER — Ambulatory Visit: Admitting: Family Medicine

## 2024-05-17 ENCOUNTER — Other Ambulatory Visit: Payer: Self-pay | Admitting: Family Medicine

## 2024-06-26 ENCOUNTER — Other Ambulatory Visit: Payer: Self-pay | Admitting: Family Medicine

## 2024-06-26 DIAGNOSIS — E785 Hyperlipidemia, unspecified: Secondary | ICD-10-CM

## 2024-07-16 ENCOUNTER — Ambulatory Visit: Admitting: Family Medicine

## 2024-07-29 ENCOUNTER — Ambulatory Visit: Admitting: Family Medicine

## 2024-08-07 ENCOUNTER — Other Ambulatory Visit: Payer: Self-pay | Admitting: Family Medicine

## 2024-08-14 ENCOUNTER — Ambulatory Visit: Admitting: Family Medicine
